# Patient Record
Sex: Female | Born: 1960 | Race: White | Hispanic: No | Marital: Married | Smoking: Never smoker
Health system: Southern US, Community
[De-identification: ages and names within clinical notes are randomized; demographics above are authoritative.]

## PROBLEM LIST (undated history)

## (undated) DIAGNOSIS — I10 Essential (primary) hypertension: Secondary | ICD-10-CM

## (undated) DIAGNOSIS — R945 Abnormal results of liver function studies: Secondary | ICD-10-CM

## (undated) DIAGNOSIS — M109 Gout, unspecified: Secondary | ICD-10-CM

## (undated) DIAGNOSIS — N189 Chronic kidney disease, unspecified: Secondary | ICD-10-CM

## (undated) DIAGNOSIS — R739 Hyperglycemia, unspecified: Secondary | ICD-10-CM

## (undated) DIAGNOSIS — K219 Gastro-esophageal reflux disease without esophagitis: Secondary | ICD-10-CM

## (undated) DIAGNOSIS — E7800 Pure hypercholesterolemia, unspecified: Secondary | ICD-10-CM

## (undated) DIAGNOSIS — D649 Anemia, unspecified: Secondary | ICD-10-CM

## (undated) HISTORY — DX: Gastro-esophageal reflux disease without esophagitis: K21.9

## (undated) HISTORY — DX: Anemia, unspecified: D64.9

## (undated) HISTORY — DX: Gout, unspecified: M10.9

## (undated) HISTORY — DX: Pure hypercholesterolemia, unspecified: E78.00

## (undated) HISTORY — DX: Chronic kidney disease, unspecified: N18.9

## (undated) HISTORY — DX: Hyperglycemia, unspecified: R73.9

## (undated) HISTORY — DX: Essential (primary) hypertension: I10

## (undated) HISTORY — DX: Abnormal results of liver function studies: R94.5

---
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
## 1985-04-11 HISTORY — PX: TUBAL LIGATION: SHX77

## 2004-07-30 ENCOUNTER — Ambulatory Visit: Payer: Self-pay | Admitting: Internal Medicine

## 2004-08-12 ENCOUNTER — Ambulatory Visit: Payer: Self-pay | Admitting: Internal Medicine

## 2005-06-03 ENCOUNTER — Ambulatory Visit: Payer: Self-pay | Admitting: Internal Medicine

## 2005-09-02 ENCOUNTER — Ambulatory Visit: Payer: Self-pay | Admitting: Internal Medicine

## 2005-09-13 ENCOUNTER — Ambulatory Visit: Payer: Self-pay | Admitting: Internal Medicine

## 2006-09-08 ENCOUNTER — Ambulatory Visit: Payer: Self-pay | Admitting: Internal Medicine

## 2007-08-10 ENCOUNTER — Ambulatory Visit: Payer: Self-pay | Admitting: Internal Medicine

## 2007-08-23 ENCOUNTER — Ambulatory Visit: Payer: Self-pay | Admitting: Internal Medicine

## 2007-09-10 ENCOUNTER — Ambulatory Visit: Payer: Self-pay | Admitting: Internal Medicine

## 2007-09-11 ENCOUNTER — Ambulatory Visit: Payer: Self-pay | Admitting: Internal Medicine

## 2007-11-01 ENCOUNTER — Ambulatory Visit: Payer: Self-pay | Admitting: Internal Medicine

## 2007-11-10 ENCOUNTER — Ambulatory Visit: Payer: Self-pay | Admitting: Internal Medicine

## 2007-12-11 ENCOUNTER — Ambulatory Visit: Payer: Self-pay | Admitting: Internal Medicine

## 2007-12-27 ENCOUNTER — Ambulatory Visit: Payer: Self-pay | Admitting: Internal Medicine

## 2008-01-10 ENCOUNTER — Ambulatory Visit: Payer: Self-pay | Admitting: Internal Medicine

## 2008-04-01 ENCOUNTER — Ambulatory Visit: Payer: Self-pay | Admitting: Internal Medicine

## 2008-09-11 ENCOUNTER — Ambulatory Visit: Payer: Self-pay | Admitting: Internal Medicine

## 2008-10-14 ENCOUNTER — Ambulatory Visit: Payer: Self-pay | Admitting: Internal Medicine

## 2008-11-09 ENCOUNTER — Ambulatory Visit: Payer: Self-pay | Admitting: Internal Medicine

## 2008-11-20 ENCOUNTER — Ambulatory Visit: Payer: Self-pay | Admitting: Internal Medicine

## 2008-12-10 ENCOUNTER — Ambulatory Visit: Payer: Self-pay | Admitting: Internal Medicine

## 2009-02-06 ENCOUNTER — Ambulatory Visit: Payer: Self-pay | Admitting: Gastroenterology

## 2009-02-12 ENCOUNTER — Ambulatory Visit: Payer: Self-pay | Admitting: Internal Medicine

## 2009-03-11 ENCOUNTER — Ambulatory Visit: Payer: Self-pay | Admitting: Internal Medicine

## 2009-05-12 ENCOUNTER — Ambulatory Visit: Payer: Self-pay | Admitting: Internal Medicine

## 2009-05-14 ENCOUNTER — Ambulatory Visit: Payer: Self-pay | Admitting: Internal Medicine

## 2009-06-09 ENCOUNTER — Ambulatory Visit: Payer: Self-pay | Admitting: Internal Medicine

## 2009-09-24 ENCOUNTER — Ambulatory Visit: Payer: Self-pay | Admitting: Gastroenterology

## 2010-01-20 ENCOUNTER — Ambulatory Visit: Payer: Self-pay | Admitting: Internal Medicine

## 2010-02-09 ENCOUNTER — Ambulatory Visit: Payer: Self-pay | Admitting: Internal Medicine

## 2010-03-08 ENCOUNTER — Ambulatory Visit: Payer: Self-pay | Admitting: Internal Medicine

## 2011-01-31 ENCOUNTER — Ambulatory Visit: Payer: Self-pay | Admitting: Gastroenterology

## 2011-03-23 ENCOUNTER — Ambulatory Visit: Payer: Self-pay | Admitting: Internal Medicine

## 2011-09-14 ENCOUNTER — Ambulatory Visit: Payer: Self-pay | Admitting: Gastroenterology

## 2012-03-01 ENCOUNTER — Telehealth: Payer: Self-pay | Admitting: Internal Medicine

## 2012-03-01 NOTE — Telephone Encounter (Signed)
[facility] Refill lovaza 1000mg  take 2 tablets twice daily crestor 5mg  once daily

## 2012-03-05 MED ORDER — OMEGA-3-ACID ETHYL ESTERS 1 G PO CAPS: ORAL_CAPSULE | ORAL | Status: DC

## 2012-03-05 MED ORDER — ROSUVASTATIN CALCIUM 5 MG PO TABS: 5.0000 mg | ORAL_TABLET | Freq: Every day | ORAL | Status: DC

## 2012-03-05 NOTE — Telephone Encounter (Signed)
Sent in to pharmacy. Patient notified.  

## 2012-03-16 DIAGNOSIS — K739 Chronic hepatitis, unspecified: Secondary | ICD-10-CM | POA: Insufficient documentation

## 2012-03-20 ENCOUNTER — Encounter: Payer: Self-pay | Admitting: Internal Medicine

## 2012-03-20 ENCOUNTER — Other Ambulatory Visit (HOSPITAL_COMMUNITY)
Admission: RE | Admit: 2012-03-20 | Discharge: 2012-03-20 | Disposition: A | Discharge status: Home / Self Care | Payer: BC Managed Care – PPO | Source: Ambulatory Visit | Attending: Internal Medicine | Admitting: Internal Medicine

## 2012-03-20 ENCOUNTER — Ambulatory Visit (INDEPENDENT_AMBULATORY_CARE_PROVIDER_SITE_OTHER): Payer: BC Managed Care – PPO | Admitting: Internal Medicine

## 2012-03-20 VITALS — BP 128/78 | HR 56 | Temp 98.5°F | Ht 64.0 in | Wt 176.0 lb

## 2012-03-20 DIAGNOSIS — N189 Chronic kidney disease, unspecified: Secondary | ICD-10-CM

## 2012-03-20 DIAGNOSIS — E781 Pure hyperglyceridemia: Secondary | ICD-10-CM

## 2012-03-20 DIAGNOSIS — R7309 Other abnormal glucose: Secondary | ICD-10-CM

## 2012-03-20 DIAGNOSIS — M109 Gout, unspecified: Secondary | ICD-10-CM

## 2012-03-20 DIAGNOSIS — R7989 Other specified abnormal findings of blood chemistry: Secondary | ICD-10-CM

## 2012-03-20 DIAGNOSIS — Z139 Encounter for screening, unspecified: Secondary | ICD-10-CM

## 2012-03-20 DIAGNOSIS — R739 Hyperglycemia, unspecified: Secondary | ICD-10-CM

## 2012-03-20 DIAGNOSIS — Z01419 Encounter for gynecological examination (general) (routine) without abnormal findings: Secondary | ICD-10-CM | POA: Insufficient documentation

## 2012-03-20 DIAGNOSIS — I10 Essential (primary) hypertension: Secondary | ICD-10-CM | POA: Insufficient documentation

## 2012-03-20 DIAGNOSIS — N184 Chronic kidney disease, stage 4 (severe): Secondary | ICD-10-CM | POA: Insufficient documentation

## 2012-03-20 DIAGNOSIS — Z1151 Encounter for screening for human papillomavirus (HPV): Secondary | ICD-10-CM | POA: Insufficient documentation

## 2012-03-20 DIAGNOSIS — R945 Abnormal results of liver function studies: Secondary | ICD-10-CM | POA: Insufficient documentation

## 2012-03-20 NOTE — Assessment & Plan Note (Addendum)
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  Seeing nephrology.  Just saw Dr Alfonse Alpers.  Stable renal function.  Obtain recent labs.

## 2012-03-20 NOTE — Progress Notes (Signed)
Subjective:    Patient ID: Angel Collins, female    DOB: 1960-04-14, 51 y.o.   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  She comes in today to follow up on these issues as well as for a complete physical exam.  States she is doing well.  Saw Dr Alfonse Alpers recently.  States kidney function stable.  Had follow up with [facility].  Felt liver - stable. Got her flu shot last week.  Completed her hepatitis series.  Blood pressure has been doing well.  Overall she feels good.   Past Medical History  Diagnosis Date  . Hypertension   . Hypercholesterolemia   . Chronic kidney disease     medullary cystic kidney disease  . Abnormal liver function     fatty liver  . Gout   . Hyperglycemia      Current Outpatient Prescriptions on File Prior to Visit  Medication Sig Dispense Refill  . enalapril (VASOTEC) 5 MG tablet Take 5 mg by mouth daily.      . febuxostat (ULORIC) 40 MG tablet Take 40 mg by mouth daily.      . ferrous sulfate 325 (65 FE) MG tablet Take 325 mg by mouth daily.      . metoprolol (LOPRESSOR) 50 MG tablet Take 50 mg by mouth 2 (two) times daily.      Marland Kitchen omega-3 acid ethyl esters (LOVAZA) 1 G capsule Take Two Tablets Twice A Day  120 capsule  1  . omeprazole (PRILOSEC) 20 MG capsule Take 20 mg by mouth daily.      . rosuvastatin (CRESTOR) 5 MG tablet Take 1 tablet (5 mg total) by mouth daily.  30 tablet  1    Review of Systems Patient denies any headache, lightheadedness or dizziness.  No significant sinus or allergy symptoms.  No chest pain, tightness or palpitations.  No increased shortness of breath or congestion. Minimal cough occasionally.  No nausea or vomiting.  No abdominal pain or cramping.  No bowel change, such as diarrhea, constipation, BRBPR or melana.  No urine change.        Objective:   Physical Exam Filed  Vitals:   03/20/12 1113  BP: 128/78  Pulse: 56  Temp: 98.5 F (24.74 C)   51 year old female in no acute distress.   HEENT:  Nares- clear.  Oropharynx - without lesions. NECK:  Supple.  Nontender.  No audible bruit.  HEART:  Appears to be regular. LUNGS:  No crackles or wheezing audible.  Respirations even and unlabored.  RADIAL PULSE:  Equal bilaterally.    BREASTS:  No nipple discharge or nipple retraction present.  Could not appreciate any distinct nodules or axillary adenopathy.  ABDOMEN:  Soft, nontender.  Bowel sounds present and normal.  No audible abdominal bruit.  GU:  Normal external genitalia.  Vaginal vault without lesions.  Cervix identified.  Pap performed. Could not appreciate any adnexal masses or tenderness.   RECTAL:  Heme negative.   EXTREMITIES:  No increased edema present.  DP pulses palpable and equal bilaterally.          Assessment & Plan:  GI.  Asymptomatic.  Colonoscopy 10/07/08 revealed diverticulosis.  Recommended 10 year follow up colonoscopy.    CARDIOVASCULAR.  Stress test 08/18/08 negative.  Continue risk factor modification.   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  Has been released.  They recommended q 6 months cbc to confirm stable.    HEALTH MAINTENANCE.  Physical today.  Pap performed.  Colonoscopy 10/07/08 revealed diverticulosis.  Recommended follow up colonoscopy in 10 years.  Mammogram 03/23/11 - Birads II. Schedule follow up mammogram.  Hemoccult testing.

## 2012-03-20 NOTE — Assessment & Plan Note (Addendum)
Felt to have fatty liver.  Saw GI recently.  Felt things were stable.  States they just checked liver function.  Obtain records.  Just completed her hepatitis series.

## 2012-03-20 NOTE — Patient Instructions (Addendum)
It was nice seeing you today.  I am glad you have been doing well.  Let me know if you need anything. 

## 2012-03-20 NOTE — Assessment & Plan Note (Signed)
On Uloric. No flares.

## 2012-03-24 ENCOUNTER — Encounter: Payer: Self-pay | Admitting: Internal Medicine

## 2012-03-24 NOTE — Assessment & Plan Note (Signed)
Low fat diet.  Check lipid profile with next fasting labs.

## 2012-03-24 NOTE — Assessment & Plan Note (Signed)
Continue same meds.  Check met b.  Follow.

## 2012-03-24 NOTE — Assessment & Plan Note (Signed)
Low carb diet.  Exercise.  Check met b and a1c.

## 2012-03-26 ENCOUNTER — Encounter: Payer: Self-pay | Admitting: *Deleted

## 2012-04-05 ENCOUNTER — Ambulatory Visit: Payer: Self-pay | Admitting: Internal Medicine

## 2012-04-30 ENCOUNTER — Encounter: Payer: Self-pay | Admitting: Internal Medicine

## 2012-05-08 ENCOUNTER — Other Ambulatory Visit: Payer: Self-pay | Admitting: General Practice

## 2012-05-08 MED ORDER — ROSUVASTATIN CALCIUM 5 MG PO TABS: 5.0000 mg | ORAL_TABLET | Freq: Every day | ORAL | Status: DC

## 2012-05-08 NOTE — Telephone Encounter (Signed)
Med filled.  

## 2012-05-15 ENCOUNTER — Other Ambulatory Visit: Payer: Self-pay | Admitting: *Deleted

## 2012-05-15 MED ORDER — ENALAPRIL MALEATE 5 MG PO TABS: 5.0000 mg | ORAL_TABLET | Freq: Every day | ORAL | Status: DC

## 2012-05-15 MED ORDER — METOPROLOL TARTRATE 50 MG PO TABS: 50.0000 mg | ORAL_TABLET | Freq: Two times a day (BID) | ORAL | Status: DC

## 2012-05-16 ENCOUNTER — Telehealth: Payer: Self-pay | Admitting: Internal Medicine

## 2012-05-16 NOTE — Telephone Encounter (Signed)
Opened in error

## 2012-05-21 ENCOUNTER — Other Ambulatory Visit: Payer: Self-pay | Admitting: *Deleted

## 2012-05-22 MED ORDER — OMEPRAZOLE 20 MG PO CPDR: 20.0000 mg | DELAYED_RELEASE_CAPSULE | Freq: Every day | ORAL | Status: DC

## 2012-05-22 MED ORDER — FEBUXOSTAT 40 MG PO TABS: 40.0000 mg | ORAL_TABLET | Freq: Every day | ORAL | Status: DC

## 2012-05-22 NOTE — Telephone Encounter (Signed)
Sent in to pharmacy.  

## 2012-06-09 ENCOUNTER — Telehealth: Payer: Self-pay | Admitting: Internal Medicine

## 2012-06-09 MED ORDER — OMEGA-3-ACID ETHYL ESTERS 1 G PO CAPS: ORAL_CAPSULE | ORAL | Status: DC

## 2012-06-09 NOTE — Telephone Encounter (Signed)
Refilled lovaza - #120 with 5 refills

## 2012-06-25 ENCOUNTER — Telehealth: Payer: Self-pay | Admitting: *Deleted

## 2012-06-25 NOTE — Telephone Encounter (Signed)
Called [phone] for Prior authorization on the Lovaza 1 GM Capsule, form is being faxed over now

## 2012-07-12 ENCOUNTER — Other Ambulatory Visit: Payer: Self-pay | Admitting: *Deleted

## 2012-07-13 MED ORDER — ROSUVASTATIN CALCIUM 5 MG PO TABS: 5.0000 mg | ORAL_TABLET | Freq: Every day | ORAL | Status: DC

## 2012-07-17 ENCOUNTER — Telehealth: Payer: Self-pay | Admitting: *Deleted

## 2012-07-17 NOTE — Telephone Encounter (Signed)
Error

## 2012-07-17 NOTE — Telephone Encounter (Signed)
Called [phone] for Prior Authorization on the Lovaza it was APPROVED, Patient will received a letter of approval

## 2012-07-17 NOTE — Telephone Encounter (Signed)
Noted  

## 2012-07-20 ENCOUNTER — Ambulatory Visit (INDEPENDENT_AMBULATORY_CARE_PROVIDER_SITE_OTHER): Payer: BC Managed Care – PPO | Admitting: Internal Medicine

## 2012-07-20 ENCOUNTER — Encounter: Payer: Self-pay | Admitting: Internal Medicine

## 2012-07-20 VITALS — BP 128/68 | HR 60 | Temp 98.3°F | Resp 18 | Wt 175.5 lb

## 2012-07-20 DIAGNOSIS — E781 Pure hyperglyceridemia: Secondary | ICD-10-CM

## 2012-07-20 DIAGNOSIS — R7989 Other specified abnormal findings of blood chemistry: Secondary | ICD-10-CM

## 2012-07-20 DIAGNOSIS — I10 Essential (primary) hypertension: Secondary | ICD-10-CM

## 2012-07-20 DIAGNOSIS — M109 Gout, unspecified: Secondary | ICD-10-CM

## 2012-07-20 DIAGNOSIS — N189 Chronic kidney disease, unspecified: Secondary | ICD-10-CM

## 2012-07-20 DIAGNOSIS — R945 Abnormal results of liver function studies: Secondary | ICD-10-CM

## 2012-07-20 DIAGNOSIS — R739 Hyperglycemia, unspecified: Secondary | ICD-10-CM

## 2012-07-20 DIAGNOSIS — R7309 Other abnormal glucose: Secondary | ICD-10-CM

## 2012-07-22 ENCOUNTER — Encounter: Payer: Self-pay | Admitting: Internal Medicine

## 2012-07-22 MED ORDER — ROSUVASTATIN CALCIUM 5 MG PO TABS: 5.0000 mg | ORAL_TABLET | Freq: Every day | ORAL | Status: DC

## 2012-07-22 NOTE — Assessment & Plan Note (Signed)
Felt to have fatty liver.  Saw GI recently.  Felt things were stable.  Has completed her hepatitis series.  Due follow up with GI within the month.

## 2012-07-22 NOTE — Assessment & Plan Note (Signed)
On Uloric. No flares.

## 2012-07-22 NOTE — Assessment & Plan Note (Signed)
Low fat diet.  Check lipid profile with next fasting labs.

## 2012-07-22 NOTE — Assessment & Plan Note (Signed)
Low carb diet.  Exercise.  Follow met b and a1c.

## 2012-07-22 NOTE — Progress Notes (Signed)
Subjective:    Patient ID: Angel Collins, female    DOB: 1960/11/18, 52 y.o.   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  She comes in today for a scheduled follow up.   States she is doing well.  Saw Dr Alfonse Alpers recently.  States kidney function had declined some.  She has had extensive w/up in preparation for a kidney transplant.  Spoke with the coordinator yesterday and now just waiting on insurance.   Had follow up with [facility].  Felt liver - stable.  Completed her hepatitis series.  Blood pressure has been doing well.  Overall she feels good.  The only complaint she has is some discomfort after eating.  Appears to be more localized to the epigastric region.  Describes it as a dull ache.  Eating does trigger.  No nausea or vomiting.  No bowel change associated.  Still has a good appetite.  Has a follow up with [facility] later this month.     Past Medical History  Diagnosis Date  . Hypertension   . Hypercholesterolemia   . Chronic kidney disease     medullary cystic kidney disease  . Abnormal liver function     fatty liver  . Gout   . Hyperglycemia      Current Outpatient Prescriptions on File Prior to Visit  Medication Sig Dispense Refill  . aspirin 81 MG tablet Take 81 mg by mouth daily.      . enalapril (VASOTEC) 5 MG tablet Take 1 tablet (5 mg total) by mouth daily.  30 tablet  5  . febuxostat (ULORIC) 40 MG tablet Take 1 tablet (40 mg total) by mouth daily.  30 tablet  5  . ferrous sulfate 325 (65 FE) MG tablet Take 325 mg by mouth daily.      . metoprolol (LOPRESSOR) 50 MG tablet Take 1 tablet (50 mg total) by mouth 2 (two) times daily.  60 tablet  5  . omega-3 acid ethyl esters (LOVAZA) 1 G capsule Take one tablet qid  120 capsule  5  . omeprazole (PRILOSEC) 20 MG capsule Take 1 capsule (20 mg total) by mouth  daily.  30 capsule  5  . rosuvastatin (CRESTOR) 5 MG tablet Take 1 tablet (5 mg total) by mouth daily.  30 tablet  1  . vitamin B-12 (CYANOCOBALAMIN) 1000 MCG tablet Take 1,000 mcg by mouth daily.      . vitamin E 400 UNIT capsule Take 400 Units by mouth 2 (two) times daily.       No current facility-administered medications on file prior to visit.    Review of Systems Patient denies any headache, lightheadedness or dizziness.  No significant sinus or allergy symptoms.  No chest pain, tightness or palpitations.  No increased shortness of breath or congestion. No nausea or vomiting.  Does report the abdominal discomfort as outlined.   No bowel change, such as diarrhea, constipation, BRBPR or melana.  No urine change.        Objective:   Physical Exam  Filed Vitals:   07/20/12 0808  BP: 128/68  Pulse: 60  Temp: 98.3 F (36.8 C)  Resp: 75   52 year old female in no acute distress.   HEENT:  Nares- clear.  Oropharynx - without lesions. NECK:  Supple.  Nontender.  No audible bruit.  HEART:  Appears  to be regular. LUNGS:  No crackles or wheezing audible.  Respirations even and unlabored.  RADIAL PULSE:  Equal bilaterally.  ABDOMEN:  Soft.  No significant tenderness.  Minimal tenderness to palpation over the epigastric region.  No rebound or guarding.  Bowel sounds present and normal.  No audible abdominal bruit.   EXTREMITIES:  No increased edema present.  DP pulses palpable and equal bilaterally.          Assessment & Plan:  GI.  Colonoscopy 10/07/08 revealed diverticulosis.  Recommended 10 year follow up colonoscopy.  Abdominal discomfort and exam as outlined.  Discussed further w/up including repeat ultrasound vs CAT scan.  She would like to hold on scanning at this time.  Will continue omeprazole in the morning and add Ranitidine in the evening.  Call with update.  Has f/u planned with GI within the month.  Further w/up pending response.    CARDIOVASCULAR.  Stress test 08/18/08  negative.  Continue risk factor modification.  Apparently recently had repeat ECHO at [facility].  Obtain results.   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  Has been released.  They recommended q 6 months cbc to confirm stable.    HEALTH MAINTENANCE.  Physical 03/20/12.  Pap performed.  Colonoscopy 10/07/08 revealed diverticulosis.  Recommended follow up colonoscopy in 10 years.  Mammogram 04/05/12 - Birads II.

## 2012-07-22 NOTE — Assessment & Plan Note (Signed)
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  Seeing nephrology.  Just saw Dr Alfonse Alpers.  Renal function has declined.  Undergoing w/up for transplant.  Waiting on insurance.

## 2012-07-22 NOTE — Assessment & Plan Note (Signed)
Continue same meds.  Follow met b.

## 2012-08-21 ENCOUNTER — Ambulatory Visit: Payer: Self-pay | Admitting: Gastroenterology

## 2012-11-08 ENCOUNTER — Other Ambulatory Visit: Payer: Self-pay | Admitting: *Deleted

## 2012-11-08 MED ORDER — ENALAPRIL MALEATE 5 MG PO TABS: 5.0000 mg | ORAL_TABLET | Freq: Every day | ORAL | Status: DC

## 2012-11-08 MED ORDER — METOPROLOL TARTRATE 50 MG PO TABS: 50.0000 mg | ORAL_TABLET | Freq: Two times a day (BID) | ORAL | Status: DC

## 2012-11-19 ENCOUNTER — Other Ambulatory Visit: Payer: Self-pay | Admitting: Internal Medicine

## 2012-11-20 ENCOUNTER — Ambulatory Visit (INDEPENDENT_AMBULATORY_CARE_PROVIDER_SITE_OTHER): Payer: BC Managed Care – PPO | Admitting: Internal Medicine

## 2012-11-20 ENCOUNTER — Encounter: Payer: Self-pay | Admitting: Internal Medicine

## 2012-11-20 VITALS — BP 120/62 | HR 58 | Temp 98.5°F | Ht 64.0 in | Wt 174.5 lb

## 2012-11-20 DIAGNOSIS — M109 Gout, unspecified: Secondary | ICD-10-CM

## 2012-11-20 DIAGNOSIS — R739 Hyperglycemia, unspecified: Secondary | ICD-10-CM

## 2012-11-20 DIAGNOSIS — R945 Abnormal results of liver function studies: Secondary | ICD-10-CM

## 2012-11-20 DIAGNOSIS — I10 Essential (primary) hypertension: Secondary | ICD-10-CM

## 2012-11-20 DIAGNOSIS — R7989 Other specified abnormal findings of blood chemistry: Secondary | ICD-10-CM

## 2012-11-20 DIAGNOSIS — D72829 Elevated white blood cell count, unspecified: Secondary | ICD-10-CM

## 2012-11-20 DIAGNOSIS — E781 Pure hyperglyceridemia: Secondary | ICD-10-CM

## 2012-11-20 DIAGNOSIS — R7309 Other abnormal glucose: Secondary | ICD-10-CM

## 2012-11-20 MED ORDER — ROSUVASTATIN CALCIUM 5 MG PO TABS: 5.0000 mg | ORAL_TABLET | Freq: Every day | ORAL | Status: DC

## 2012-11-20 MED ORDER — OMEGA-3-ACID ETHYL ESTERS 1 G PO CAPS: ORAL_CAPSULE | ORAL | Status: DC

## 2012-11-20 NOTE — Assessment & Plan Note (Signed)
On Uloric. No flares.

## 2012-11-20 NOTE — Progress Notes (Signed)
Subjective:    Patient ID: Angel Collins, female    DOB: 05-10-1960, 52 y.o.   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  She comes in today for a scheduled follow up.   States she is doing well.  The previous GI issues have completely resolved.  Saw GYN.  Had a pelvic ultrasound and found to have an adnexal cyst.  States after evaluation by gyn, no further w/up felt warranted.  States her w/up is complete and she is now on the transplant list for a kidney transplant.  Overall she feels she is doing well.  Blood pressure doing well.  Has not been watching what she eats.  Plans to get more serious about this.     Past Medical History  Diagnosis Date  . Hypertension   . Hypercholesterolemia   . Chronic kidney disease     medullary cystic kidney disease  . Abnormal liver function     fatty liver  . Gout   . Hyperglycemia     Current Outpatient Prescriptions on File Prior to Visit  Medication Sig Dispense Refill  . aspirin 81 MG tablet Take 81 mg by mouth daily.      . enalapril (VASOTEC) 5 MG tablet Take 1 tablet (5 mg total) by mouth daily.  30 tablet  5  . ferrous sulfate 325 (65 FE) MG tablet Take 325 mg by mouth daily.      . metoprolol (LOPRESSOR) 50 MG tablet Take 1 tablet (50 mg total) by mouth 2 (two) times daily.  60 tablet  5  . omeprazole (PRILOSEC) 20 MG capsule Take 1 capsule (20 mg total) by mouth daily.  30 capsule  5  . ULORIC 40 MG tablet TAKE 1 TABLET BY MOUTH EVERY DAY AS DIRECTED  30 tablet  5  . vitamin B-12 (CYANOCOBALAMIN) 1000 MCG tablet Take 1,000 mcg by mouth daily.      . vitamin E 400 UNIT capsule Take 400 Units by mouth 2 (two) times daily.       No current facility-administered medications on file prior to visit.    Review of Systems Patient denies any headache, lightheadedness or dizziness.  No  significant sinus or allergy symptoms.  No chest pain, tightness or palpitations.  No increased shortness of breath or congestion. No nausea or vomiting.  The abdominal discomfort has resolved.   No bowel change, such as diarrhea, constipation, BRBPR or melana.  No urine change.        Objective:   Physical Exam  Filed Vitals:   11/20/12 0842  BP: 120/62  Pulse: 58  Temp: 98.5 F (39.66 C)   52 year old female in no acute distress.   HEENT:  Nares- clear.  Oropharynx - without lesions. NECK:  Supple.  Nontender.  No audible bruit.  HEART:  Appears to be regular. LUNGS:  No crackles or wheezing audible.  Respirations even and unlabored.  RADIAL PULSE:  Equal bilaterally.  ABDOMEN:  Soft.  No significant tenderness.  Non tender to palpation.  No rebound or guarding.  Bowel sounds present and normal.  No audible abdominal bruit.   EXTREMITIES:  No increased edema present.  DP pulses palpable and equal bilaterally.          Assessment & Plan:  GI.  Colonoscopy 10/07/08 revealed diverticulosis.  Recommended 10 year follow up  colonoscopy.  The previous abdominal discomfort has resolved.  Doing well.    CARDIOVASCULAR.  Stress test 08/18/08 negative.  Continue risk factor modification.  Apparently recently had repeat ECHO at [facility].  States everything checked out fine.  Currently asymptomatic.  Follow.   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  Has been released.  They recommended q 6 months cbc to confirm stable.    HEALTH MAINTENANCE.  Physical 03/20/12.  Pap performed.  Colonoscopy 10/07/08 revealed diverticulosis.  Recommended follow up colonoscopy in 10 years.  Mammogram 04/05/12 - Birads II.

## 2012-11-20 NOTE — Assessment & Plan Note (Signed)
Low carb diet.  Exercise.  Follow met b and a1c.  Discussed the need for an eye exam.

## 2012-11-20 NOTE — Assessment & Plan Note (Signed)
Low fat diet.  Check lipid profile with next fasting labs.

## 2012-11-20 NOTE — Assessment & Plan Note (Signed)
Felt to have fatty liver.  Saw GI recently.  Felt things were stable.  Has completed her hepatitis series.  Follow liver panel.

## 2012-11-20 NOTE — Assessment & Plan Note (Signed)
Continue same meds.  Follow met b.

## 2012-11-20 NOTE — Assessment & Plan Note (Signed)
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  Seeing nephrology.  Followed by Dr Alfonse Alpers.  Renal function has declined.  Currently on the transplant list.

## 2012-11-29 ENCOUNTER — Other Ambulatory Visit (INDEPENDENT_AMBULATORY_CARE_PROVIDER_SITE_OTHER): Payer: BC Managed Care – PPO

## 2012-11-29 DIAGNOSIS — I10 Essential (primary) hypertension: Secondary | ICD-10-CM

## 2012-11-29 DIAGNOSIS — R739 Hyperglycemia, unspecified: Secondary | ICD-10-CM

## 2012-11-29 DIAGNOSIS — R945 Abnormal results of liver function studies: Secondary | ICD-10-CM

## 2012-11-29 DIAGNOSIS — R7309 Other abnormal glucose: Secondary | ICD-10-CM

## 2012-11-29 DIAGNOSIS — E781 Pure hyperglyceridemia: Secondary | ICD-10-CM

## 2012-11-29 DIAGNOSIS — R7989 Other specified abnormal findings of blood chemistry: Secondary | ICD-10-CM

## 2012-11-29 DIAGNOSIS — D72829 Elevated white blood cell count, unspecified: Secondary | ICD-10-CM

## 2012-11-29 LAB — BASIC METABOLIC PANEL
BUN: 56 mg/dL — ABNORMAL HIGH (ref 6–23)
Calcium: 9.5 mg/dL (ref 8.4–10.5)
Creatinine, Ser: 2.5 mg/dL — ABNORMAL HIGH (ref 0.4–1.2)
GFR: 21.64 mL/min — ABNORMAL LOW (ref >60.00)
Glucose, Bld: 110 mg/dL — ABNORMAL HIGH (ref 70–99)

## 2012-11-29 LAB — CBC WITH DIFFERENTIAL/PLATELET
Basophils Relative: 0.5 % (ref 0.0–3.0)
Eosinophils Absolute: 0.2 10*3/uL (ref 0.0–0.7)
Hemoglobin: 11.2 g/dL — ABNORMAL LOW (ref 12.0–15.0)
Lymphocytes Relative: 35.7 % (ref 12.0–46.0)
MCHC: 33.8 g/dL (ref 30.0–36.0)
MCV: 93.1 fl (ref 78.0–100.0)
Neutro Abs: 4.6 10*3/uL (ref 1.4–7.7)
RBC: 3.55 Mil/uL — ABNORMAL LOW (ref 3.87–5.11)

## 2012-11-29 LAB — HEPATIC FUNCTION PANEL
ALT: 43 U/L — ABNORMAL HIGH (ref 0–35)
AST: 25 U/L (ref 0–37)
Albumin: 3.5 g/dL (ref 3.5–5.2)
Alkaline Phosphatase: 82 U/L (ref 39–117)
Bilirubin, Direct: 0.1 mg/dL (ref 0.0–0.3)
Total Protein: 7.4 g/dL (ref 6.0–8.3)

## 2012-11-29 LAB — LIPID PANEL: HDL: 40.9 mg/dL (ref >39.00)

## 2012-11-29 LAB — LDL CHOLESTEROL, DIRECT: Direct LDL: 70.5 mg/dL

## 2012-12-02 ENCOUNTER — Encounter: Payer: Self-pay | Admitting: Internal Medicine

## 2012-12-02 ENCOUNTER — Telehealth: Payer: Self-pay | Admitting: Internal Medicine

## 2012-12-02 DIAGNOSIS — D649 Anemia, unspecified: Secondary | ICD-10-CM

## 2012-12-02 NOTE — Telephone Encounter (Signed)
Pt notified of lab results via my chart.  She needs a follow up non fasting lab appt within 1-2 weeks.  Please schedule and notify pt of appt date and time.  Thanks.

## 2012-12-03 NOTE — Telephone Encounter (Signed)
Appointment 9/9  Pt aware

## 2012-12-18 ENCOUNTER — Encounter: Payer: Self-pay | Admitting: Internal Medicine

## 2012-12-18 ENCOUNTER — Other Ambulatory Visit (INDEPENDENT_AMBULATORY_CARE_PROVIDER_SITE_OTHER): Payer: BC Managed Care – PPO

## 2012-12-18 DIAGNOSIS — D649 Anemia, unspecified: Secondary | ICD-10-CM

## 2012-12-18 LAB — CBC WITH DIFFERENTIAL/PLATELET
Basophils Absolute: 0.1 10*3/uL (ref 0.0–0.1)
Eosinophils Absolute: 0.2 10*3/uL (ref 0.0–0.7)
Hemoglobin: 11.5 g/dL — ABNORMAL LOW (ref 12.0–15.0)
Lymphocytes Relative: 40.2 % (ref 12.0–46.0)
MCHC: 33.5 g/dL (ref 30.0–36.0)
Monocytes Relative: 7.4 % (ref 3.0–12.0)
Neutro Abs: 3.9 10*3/uL (ref 1.4–7.7)
Neutrophils Relative %: 48.7 % (ref 43.0–77.0)
Platelets: 206 10*3/uL (ref 150.0–400.0)
RDW: 13.3 % (ref 11.5–14.6)

## 2012-12-18 LAB — FERRITIN: Ferritin: 276 ng/mL (ref 10.0–291.0)

## 2012-12-18 LAB — IBC PANEL: Transferrin: 264.4 mg/dL (ref 212.0–360.0)

## 2012-12-21 ENCOUNTER — Encounter: Payer: Self-pay | Admitting: Internal Medicine

## 2012-12-21 ENCOUNTER — Other Ambulatory Visit: Payer: Self-pay | Admitting: *Deleted

## 2013-01-02 ENCOUNTER — Encounter: Payer: Self-pay | Admitting: Internal Medicine

## 2013-01-03 ENCOUNTER — Ambulatory Visit (INDEPENDENT_AMBULATORY_CARE_PROVIDER_SITE_OTHER): Payer: BC Managed Care – PPO | Admitting: Internal Medicine

## 2013-01-03 ENCOUNTER — Encounter: Payer: Self-pay | Admitting: Internal Medicine

## 2013-01-03 VITALS — BP 98/64 | HR 55 | Temp 98.2°F | Ht 64.0 in | Wt 171.8 lb

## 2013-01-03 DIAGNOSIS — R7309 Other abnormal glucose: Secondary | ICD-10-CM

## 2013-01-03 DIAGNOSIS — I10 Essential (primary) hypertension: Secondary | ICD-10-CM

## 2013-01-03 DIAGNOSIS — R7989 Other specified abnormal findings of blood chemistry: Secondary | ICD-10-CM

## 2013-01-03 DIAGNOSIS — R945 Abnormal results of liver function studies: Secondary | ICD-10-CM

## 2013-01-03 DIAGNOSIS — N189 Chronic kidney disease, unspecified: Secondary | ICD-10-CM

## 2013-01-03 DIAGNOSIS — Z23 Encounter for immunization: Secondary | ICD-10-CM

## 2013-01-03 DIAGNOSIS — E781 Pure hyperglyceridemia: Secondary | ICD-10-CM

## 2013-01-03 DIAGNOSIS — M109 Gout, unspecified: Secondary | ICD-10-CM

## 2013-01-03 DIAGNOSIS — R739 Hyperglycemia, unspecified: Secondary | ICD-10-CM

## 2013-01-06 ENCOUNTER — Telehealth: Payer: Self-pay | Admitting: Internal Medicine

## 2013-01-06 ENCOUNTER — Encounter: Payer: Self-pay | Admitting: Internal Medicine

## 2013-01-06 NOTE — Progress Notes (Signed)
Subjective:    Patient ID: Angel Collins, female    DOB: 08-Aug-1960, 52 y.o.   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  She comes in today as a work in to discuss her sugars and referral to [facility] and weight loss.  States she is doing well.  The previous GI issues have completely resolved.  Saw GYN.  Had a pelvic ultrasound and found to have an adnexal cyst.  States after evaluation by gyn, no further w/up felt warranted.  States her w/up is complete and she is on the transplant list for a kidney transplant.   Collins pressure doing well.  Has not been watching what she eats.  Plans to get more serious about this.  Saw Dr Alfonse Alpers recently.  She discussed with her regarding her sugars.  We discussed this at length today.  Discussed diet adjustment and exercise.  Discussed checking sugars.  Discussed my desire to refer to a nutritionist first instead of starting medication for weight loss    Past Medical History  Diagnosis Date  . Hypertension   . Hypercholesterolemia   . Chronic kidney disease     medullary cystic kidney disease  . Abnormal liver function     fatty liver  . Gout   . Hyperglycemia     Current Outpatient Prescriptions on File Prior to Visit  Medication Sig Dispense Refill  . aspirin 81 MG tablet Take 81 mg by mouth daily.      . enalapril (VASOTEC) 5 MG tablet Take 1 tablet (5 mg total) by mouth daily.  30 tablet  5  . ferrous sulfate 325 (65 FE) MG tablet Take 325 mg by mouth daily.      . metoprolol (LOPRESSOR) 50 MG tablet Take 1 tablet (50 mg total) by mouth 2 (two) times daily.  60 tablet  5  . omega-3 acid ethyl esters (LOVAZA) 1 G capsule Take one tablet qid  120 capsule  5  . omeprazole (PRILOSEC) 20 MG capsule Take 1 capsule (20 mg total) by mouth daily.  30 capsule  5  . rosuvastatin (CRESTOR) 5 MG tablet  Take 1 tablet (5 mg total) by mouth daily.  30 tablet  5  . ULORIC 40 MG tablet TAKE 1 TABLET BY MOUTH EVERY DAY AS DIRECTED  30 tablet  5  . vitamin B-12 (CYANOCOBALAMIN) 1000 MCG tablet Take 1,000 mcg by mouth daily.      . vitamin E 400 UNIT capsule Take 400 Units by mouth 2 (two) times daily.       No current facility-administered medications on file prior to visit.    Review of Systems Patient denies any headache, lightheadedness or dizziness.  No significant sinus or allergy symptoms.  No chest pain, tightness or palpitations.  No increased shortness of breath or congestion. No nausea or vomiting.  The abdominal discomfort has resolved.   No bowel change, such as diarrhea, constipation, BRBPR or melana.  No urine change.        Objective:   Physical Exam  Filed Vitals:   01/03/13 1050  BP: 98/64  Pulse: 55  Temp: 98.2 F (5.87 C)   52 year old female in no acute distress.   HEENT:  Nares- clear.  Oropharynx - without lesions. NECK:  Supple.  Nontender.  No audible bruit.  HEART:  Appears to be regular.  LUNGS:  No crackles or wheezing audible.  Respirations even and unlabored.  RADIAL PULSE:  Equal bilaterally.  ABDOMEN:  Soft.  No significant tenderness.  Non tender to palpation.  No rebound or guarding.  Bowel sounds present and normal.  No audible abdominal bruit.   EXTREMITIES:  No increased edema present.  DP pulses palpable and equal bilaterally.  FEET:  Without lesions.           Assessment & Plan:  GI.  Colonoscopy 10/07/08 revealed diverticulosis.  Recommended 10 year follow up colonoscopy.  The previous abdominal discomfort has resolved.  Doing well.    CARDIOVASCULAR.  Stress test 08/18/08 negative.  Continue risk factor modification.  Apparently recently had repeat ECHO at [facility].  States everything checked out fine.  Currently asymptomatic.  Follow.   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  Has been released.  They recommended q 6 months cbc to confirm stable.     HEALTH MAINTENANCE.  Physical 03/20/12.  Pap performed.  Colonoscopy 10/07/08 revealed diverticulosis.  Recommended follow up colonoscopy in 10 years.  Mammogram 04/05/12 - Birads II.

## 2013-01-06 NOTE — Assessment & Plan Note (Signed)
Low fat diet.  Follow fasting lipid profile.

## 2013-01-06 NOTE — Telephone Encounter (Signed)
Pt needs referral to [facility] for nutrition counseling only.  Does have hyperglycemia, hypertension and hyperlipidemia.   Thanks.

## 2013-01-06 NOTE — Assessment & Plan Note (Signed)
On Uloric. No flares.

## 2013-01-06 NOTE — Assessment & Plan Note (Signed)
Sugars elevated.  She was given a glucometer today.  Instructed on proper way to check her sugars.  Refer to lif[facility]yles for nutrition counseling.  Pt in agreement.  Exercise.

## 2013-01-06 NOTE — Assessment & Plan Note (Signed)
Felt to have fatty liver.  Saw GI recently.  Felt things were stable.  Has completed her hepatitis series.  Follow liver panel.

## 2013-01-06 NOTE — Assessment & Plan Note (Signed)
Continue same meds.  Follow met b.

## 2013-01-06 NOTE — Assessment & Plan Note (Signed)
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  Seeing nephrology.  Followed by Dr Alfonse Alpers.  Renal function has declined.  Currently on the transplant list.

## 2013-01-07 NOTE — Telephone Encounter (Signed)
I can't find the referral sheet.  If you will give me another one I will be glad to complete.  Thanks.

## 2013-01-07 NOTE — Telephone Encounter (Signed)
Referral placed in your folder to be signed.

## 2013-02-06 ENCOUNTER — Encounter: Payer: Self-pay | Admitting: Emergency Medicine

## 2013-03-16 ENCOUNTER — Other Ambulatory Visit: Payer: Self-pay | Admitting: Internal Medicine

## 2013-03-26 ENCOUNTER — Ambulatory Visit (INDEPENDENT_AMBULATORY_CARE_PROVIDER_SITE_OTHER): Payer: BC Managed Care – PPO | Admitting: Internal Medicine

## 2013-03-26 ENCOUNTER — Encounter: Payer: Self-pay | Admitting: Internal Medicine

## 2013-03-26 VITALS — BP 100/60 | HR 60 | Temp 98.1°F | Ht 61.0 in | Wt 170.5 lb

## 2013-03-26 DIAGNOSIS — R739 Hyperglycemia, unspecified: Secondary | ICD-10-CM

## 2013-03-26 DIAGNOSIS — M109 Gout, unspecified: Secondary | ICD-10-CM

## 2013-03-26 DIAGNOSIS — R7989 Other specified abnormal findings of blood chemistry: Secondary | ICD-10-CM

## 2013-03-26 DIAGNOSIS — I10 Essential (primary) hypertension: Secondary | ICD-10-CM

## 2013-03-26 DIAGNOSIS — R945 Abnormal results of liver function studies: Secondary | ICD-10-CM

## 2013-03-26 DIAGNOSIS — E781 Pure hyperglyceridemia: Secondary | ICD-10-CM

## 2013-03-26 DIAGNOSIS — D72829 Elevated white blood cell count, unspecified: Secondary | ICD-10-CM

## 2013-03-26 DIAGNOSIS — Z1239 Encounter for other screening for malignant neoplasm of breast: Secondary | ICD-10-CM

## 2013-03-26 DIAGNOSIS — N189 Chronic kidney disease, unspecified: Secondary | ICD-10-CM

## 2013-03-26 DIAGNOSIS — R7309 Other abnormal glucose: Secondary | ICD-10-CM

## 2013-03-26 NOTE — Progress Notes (Signed)
Subjective:    Patient ID: Angel Collins, female    DOB: 1960-09-28, 52 y.o.   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  She comes in today to follow up on these issues as well as for a complete physical exam.  States she is doing well.  The previous GI issues have completely resolved.  Saw GYN.  Had a pelvic ultrasound and found to have an adnexal cyst.  States after evaluation by gyn, no further w/up felt warranted.  Her w/up is complete and she is now on the transplant list for a kidney transplant.  Overall she feels she is doing well.  Blood pressure doing well.  Has not been watching what she eats.  Plans to get more serious about this.  States her blood pressure has been averaging 100-110/65-70.       Past Medical History  Diagnosis Date  . Hypertension   . Hypercholesterolemia   . Chronic kidney disease     medullary cystic kidney disease  . Abnormal liver function     fatty liver  . Gout   . Hyperglycemia     Current Outpatient Prescriptions on File Prior to Visit  Medication Sig Dispense Refill  . aspirin 81 MG tablet Take 81 mg by mouth daily.      . CRESTOR 5 MG tablet TAKE 1 TABLET BY MOUTH EVERY DAY  30 tablet  5  . enalapril (VASOTEC) 5 MG tablet Take 1 tablet (5 mg total) by mouth daily.  30 tablet  5  . ferrous sulfate 325 (65 FE) MG tablet Take 325 mg by mouth daily.      . metoprolol (LOPRESSOR) 50 MG tablet Take 1 tablet (50 mg total) by mouth 2 (two) times daily.  60 tablet  5  . omega-3 acid ethyl esters (LOVAZA) 1 G capsule Take one tablet qid  120 capsule  5  . rosuvastatin (CRESTOR) 5 MG tablet Take 1 tablet (5 mg total) by mouth daily.  30 tablet  5  . ULORIC 40 MG tablet TAKE 1 TABLET BY MOUTH EVERY DAY AS DIRECTED  30 tablet  5  . vitamin B-12 (CYANOCOBALAMIN) 1000 MCG tablet Take 1,000 mcg by mouth  daily.      . vitamin E 400 UNIT capsule Take 400 Units by mouth 2 (two) times daily.       No current facility-administered medications on file prior to visit.    Review of Systems Patient denies any headache, lightheadedness or dizziness.  No significant sinus or allergy symptoms.  No chest pain, tightness or palpitations.  No increased shortness of breath or congestion.  No nausea or vomiting.  The abdominal discomfort has resolved.   No bowel change, such as diarrhea, constipation, BRBPR or melana.  No urine change.  Blood pressure as outlined.  Due to see the dietician on 04/08/13.        Objective:   Physical Exam  Filed Vitals:   03/26/13 1548  BP: 100/60  Pulse: 60  Temp: 98.1 F (36.7 C)   Blood pressure recheck:  104/72, pulse 65  52 year old female in no acute distress.   HEENT:  Nares- clear.  Oropharynx - without lesions. NECK:  Supple.  Nontender.  No audible bruit.  HEART:  Appears to be regular. LUNGS:  No crackles or wheezing audible.  Respirations even  and unlabored.  RADIAL PULSE:  Equal bilaterally.    BREASTS:  No nipple discharge or nipple retraction present.  Could not appreciate any distinct nodules or axillary adenopathy.  ABDOMEN:  Soft, nontender.  Bowel sounds present and normal.  No audible abdominal bruit.  GU:  Not performed.     EXTREMITIES:  No increased edema present.  DP pulses palpable and equal bilaterally.      FEET:  Without lesions.       Assessment & Plan:  GI.  Colonoscopy 10/07/08 revealed diverticulosis.  Recommended 10 year follow up colonoscopy.  The previous abdominal discomfort has resolved.  Doing well.    CARDIOVASCULAR.  Stress test 08/18/08 negative.  Continue risk factor modification.  Apparently recently had repeat ECHO at [facility].  States everything checked out fine.  Currently asymptomatic.  Follow.   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  Has been released.  They recommended q 6 months cbc to confirm stable.    HEALTH  MAINTENANCE.  Physical today.  Pap 03/20/12 negative with negative HPV.  Colonoscopy 10/07/08 revealed diverticulosis.  Recommended follow up colonoscopy in 10 years.  Mammogram 04/05/12 - Birads II.  Schedule a f/u mammogram.

## 2013-03-26 NOTE — Progress Notes (Signed)
Pre-visit discussion using our clinic review tool. No additional management support is needed unless otherwise documented below in the visit note.  

## 2013-03-29 ENCOUNTER — Encounter: Payer: Self-pay | Admitting: Emergency Medicine

## 2013-03-31 ENCOUNTER — Encounter: Payer: Self-pay | Admitting: Internal Medicine

## 2013-03-31 DIAGNOSIS — D72829 Elevated white blood cell count, unspecified: Secondary | ICD-10-CM | POA: Insufficient documentation

## 2013-03-31 NOTE — Assessment & Plan Note (Signed)
Low carb diet and exercise.  Planning to go to [facility] 04/08/13.  Diet, exercise and weight loss.  Follow.

## 2013-03-31 NOTE — Assessment & Plan Note (Signed)
Felt to have fatty liver.  Saw GI recently.  Felt things were stable.  Has completed her hepatitis series.  Follow liver panel.

## 2013-03-31 NOTE — Assessment & Plan Note (Signed)
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  Seeing nephrology.  Followed by Dr Alfonse Alpers.  Renal function has declined.  Currently on the transplant list.

## 2013-03-31 NOTE — Assessment & Plan Note (Signed)
Low fat diet.  Follow fasting lipid profile.

## 2013-03-31 NOTE — Assessment & Plan Note (Addendum)
Blood pressure as outlined.  Low.  Will decrease metoprolol to 50mg  in the am and 1/2(50mg ) in the evening.  Follow pressures.  Get her back in soon to reassess.  Continue to adjust if needed.  Follow metabolic panel.

## 2013-03-31 NOTE — Assessment & Plan Note (Signed)
Saw hematology.  Recommended f/u cbc every 6 months.

## 2013-03-31 NOTE — Assessment & Plan Note (Signed)
On Uloric. No flares.

## 2013-04-08 ENCOUNTER — Ambulatory Visit: Payer: Self-pay | Admitting: Internal Medicine

## 2013-04-08 ENCOUNTER — Other Ambulatory Visit (INDEPENDENT_AMBULATORY_CARE_PROVIDER_SITE_OTHER): Payer: BC Managed Care – PPO

## 2013-04-08 DIAGNOSIS — R7989 Other specified abnormal findings of blood chemistry: Secondary | ICD-10-CM

## 2013-04-08 DIAGNOSIS — R7309 Other abnormal glucose: Secondary | ICD-10-CM

## 2013-04-08 DIAGNOSIS — I10 Essential (primary) hypertension: Secondary | ICD-10-CM

## 2013-04-08 DIAGNOSIS — E781 Pure hyperglyceridemia: Secondary | ICD-10-CM

## 2013-04-08 DIAGNOSIS — R945 Abnormal results of liver function studies: Secondary | ICD-10-CM

## 2013-04-08 DIAGNOSIS — D72829 Elevated white blood cell count, unspecified: Secondary | ICD-10-CM

## 2013-04-08 DIAGNOSIS — R739 Hyperglycemia, unspecified: Secondary | ICD-10-CM

## 2013-04-08 LAB — LIPID PANEL
Cholesterol: 208 mg/dL — ABNORMAL HIGH (ref 0–200)
HDL: 47.6 mg/dL (ref >39.00)
Total CHOL/HDL Ratio: 4
Triglycerides: 486 mg/dL — ABNORMAL HIGH (ref 0.0–149.0)
VLDL: 97.2 mg/dL — ABNORMAL HIGH (ref 0.0–40.0)

## 2013-04-08 LAB — HEPATIC FUNCTION PANEL
AST: 25 U/L (ref 0–37)
Albumin: 3.7 g/dL (ref 3.5–5.2)
Alkaline Phosphatase: 77 U/L (ref 39–117)
Bilirubin, Direct: 0.1 mg/dL (ref 0.0–0.3)
Total Protein: 7.2 g/dL (ref 6.0–8.3)

## 2013-04-08 LAB — CBC WITH DIFFERENTIAL/PLATELET
Eosinophils Relative: 3.1 % (ref 0.0–5.0)
HCT: 35.2 % — ABNORMAL LOW (ref 36.0–46.0)
Lymphocytes Relative: 37.3 % (ref 12.0–46.0)
Monocytes Relative: 8.1 % (ref 3.0–12.0)
Neutrophils Relative %: 51 % (ref 43.0–77.0)
Platelets: 172 10*3/uL (ref 150.0–400.0)
WBC: 8.1 10*3/uL (ref 4.5–10.5)

## 2013-04-08 LAB — BASIC METABOLIC PANEL
Calcium: 9.8 mg/dL (ref 8.4–10.5)
Chloride: 109 mEq/L (ref 96–112)
Creatinine, Ser: 2.6 mg/dL — ABNORMAL HIGH (ref 0.4–1.2)
Potassium: 4.8 mEq/L (ref 3.5–5.1)

## 2013-04-08 LAB — TSH: TSH: 3.91 u[IU]/mL (ref 0.35–5.50)

## 2013-04-08 LAB — HEMOGLOBIN A1C: Hgb A1c MFr Bld: 6.2 % (ref 4.6–6.5)

## 2013-04-09 ENCOUNTER — Encounter: Payer: Self-pay | Admitting: Internal Medicine

## 2013-04-09 ENCOUNTER — Other Ambulatory Visit: Payer: Self-pay | Admitting: Internal Medicine

## 2013-04-11 ENCOUNTER — Ambulatory Visit: Payer: Self-pay | Admitting: Internal Medicine

## 2013-04-12 NOTE — Telephone Encounter (Signed)
Mailed unread message to pt  

## 2013-04-16 ENCOUNTER — Ambulatory Visit: Payer: Self-pay | Admitting: Internal Medicine

## 2013-04-16 ENCOUNTER — Encounter: Payer: Self-pay | Admitting: Internal Medicine

## 2013-04-16 LAB — HM MAMMOGRAPHY: HM Mammogram: NEGATIVE

## 2013-04-30 ENCOUNTER — Ambulatory Visit: Payer: BC Managed Care – PPO | Admitting: Internal Medicine

## 2013-05-09 ENCOUNTER — Ambulatory Visit (INDEPENDENT_AMBULATORY_CARE_PROVIDER_SITE_OTHER): Payer: BC Managed Care – PPO | Admitting: Internal Medicine

## 2013-05-09 VITALS — BP 110/70 | HR 60 | Temp 98.1°F | Ht 61.0 in | Wt 172.2 lb

## 2013-05-09 DIAGNOSIS — M109 Gout, unspecified: Secondary | ICD-10-CM

## 2013-05-09 DIAGNOSIS — R739 Hyperglycemia, unspecified: Secondary | ICD-10-CM

## 2013-05-09 DIAGNOSIS — D72829 Elevated white blood cell count, unspecified: Secondary | ICD-10-CM

## 2013-05-09 DIAGNOSIS — N189 Chronic kidney disease, unspecified: Secondary | ICD-10-CM

## 2013-05-09 DIAGNOSIS — E781 Pure hyperglyceridemia: Secondary | ICD-10-CM

## 2013-05-09 DIAGNOSIS — R7989 Other specified abnormal findings of blood chemistry: Secondary | ICD-10-CM

## 2013-05-09 DIAGNOSIS — R7309 Other abnormal glucose: Secondary | ICD-10-CM

## 2013-05-09 DIAGNOSIS — R945 Abnormal results of liver function studies: Secondary | ICD-10-CM

## 2013-05-09 DIAGNOSIS — I10 Essential (primary) hypertension: Secondary | ICD-10-CM

## 2013-05-09 NOTE — Progress Notes (Signed)
Pre-visit discussion using our clinic review tool. No additional management support is needed unless otherwise documented below in the visit note.  

## 2013-05-10 ENCOUNTER — Other Ambulatory Visit: Payer: Self-pay | Admitting: Internal Medicine

## 2013-05-12 ENCOUNTER — Encounter: Payer: Self-pay | Admitting: Internal Medicine

## 2013-05-12 NOTE — Progress Notes (Signed)
Subjective:    Patient ID: Angel Collins, female    DOB: 09-11-1960, 53 y.o.   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  She comes in today for a scheduled follow up.   States she is doing well.  The previous GI issues have completely resolved.  Saw GYN.  Had a pelvic ultrasound and found to have an adnexal cyst.  States after evaluation by gyn, no further w/up felt warranted.  Her w/up is complete and she is now on the transplant list for a kidney transplant.  Overall she feels she is doing well.  Blood pressure doing well.  Has not been watching what she eats.  Plans to get more serious about this.  Did go to [facility].        Past Medical History  Diagnosis Date  . Hypertension   . Hypercholesterolemia   . Chronic kidney disease     medullary cystic kidney disease  . Abnormal liver function     fatty liver  . Gout   . Hyperglycemia     Current Outpatient Prescriptions on File Prior to Visit  Medication Sig Dispense Refill  . aspirin 81 MG tablet Take 81 mg by mouth daily.      . enalapril (VASOTEC) 5 MG tablet Take 1 tablet (5 mg total) by mouth daily.  30 tablet  5  . ferrous sulfate 325 (65 FE) MG tablet Take 325 mg by mouth daily.      . metoprolol (LOPRESSOR) 50 MG tablet Take 1 tablet (50 mg total) by mouth 2 (two) times daily.  60 tablet  5  . omega-3 acid ethyl esters (LOVAZA) 1 G capsule Take one tablet qid  120 capsule  5  . PRILOSEC OTC 20 MG tablet TAKE ONE TABLET BY MOUTH EVERY DAY  28 tablet  5  . rosuvastatin (CRESTOR) 5 MG tablet Take 1 tablet (5 mg total) by mouth daily.  30 tablet  5  . vitamin B-12 (CYANOCOBALAMIN) 1000 MCG tablet Take 1,000 mcg by mouth daily.      . vitamin E 400 UNIT capsule Take 400 Units by mouth 2 (two) times daily.       No current facility-administered medications on file prior to  visit.    Review of Systems Patient denies any headache, lightheadedness or dizziness.  No significant sinus or allergy symptoms.  No chest pain, tightness or palpitations.  No increased shortness of breath or congestion.  No nausea or vomiting.  The abdominal discomfort has resolved.   No bowel change, such as diarrhea, constipation, BRBPR or melana.  No urine change.  Blood pressure doing well.  Has seen the nutritionist.  Plans to get more serious about her diet and exercise.          Objective:   Physical Exam  Filed Vitals:   05/09/13 0957  BP: 110/70  Pulse: 60  Temp: 98.1 F (36.7 C)   Blood pressure recheck:  47/57  53 year old female in no acute distress.   HEENT:  Nares- clear.  Oropharynx - without lesions. NECK:  Supple.  Nontender.  No audible bruit.  HEART:  Appears to be regular. LUNGS:  No crackles or wheezing audible.  Respirations even and unlabored.  RADIAL PULSE:  Equal bilaterally.  ABDOMEN:  Soft, nontender.  Bowel sounds present and normal.  No audible  abdominal bruit.   EXTREMITIES:  No increased edema present.  DP pulses palpable and equal bilaterally.      FEET:  Without lesions.       Assessment & Plan:  GI.  Colonoscopy 10/07/08 revealed diverticulosis.  Recommended 10 year follow up colonoscopy.  The previous abdominal discomfort has resolved.  Doing well.    CARDIOVASCULAR.  Stress test 08/18/08 negative.  Continue risk factor modification.  Apparently recently had repeat ECHO at [facility]  States everything checked out fine.  Currently asymptomatic.  Follow.   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  Has been released.  They recommended q 6 months cbc to confirm stable.    HEALTH MAINTENANCE.  Physical 03/26/13.   Pap 03/20/12 negative with negative HPV.  Colonoscopy 10/07/08 revealed diverticulosis.  Recommended follow up colonoscopy in 10 years.  Mammogram 16/15 - per report - ok.  Need results.

## 2013-05-12 NOTE — Assessment & Plan Note (Signed)
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  Seeing nephrology.  Followed by Dr Alfonse Alpers.  Renal function has declined.  Currently on the transplant list.

## 2013-05-12 NOTE — Assessment & Plan Note (Signed)
On Uloric. No flares.

## 2013-05-12 NOTE — Assessment & Plan Note (Signed)
Low carb diet and exercise.  Went to [employer].  Diet, exercise and weight loss.  Follow.

## 2013-05-12 NOTE — Assessment & Plan Note (Signed)
Felt to have fatty liver.  Saw GI recently.  Felt things were stable.  Has completed her hepatitis series.  Follow liver panel.

## 2013-05-12 NOTE — Assessment & Plan Note (Signed)
Now on metoprolol 50mg  in the am and 1/2(50mg ) in the evening.  Pressures doing well.   Follow metabolic panel.

## 2013-05-12 NOTE — Assessment & Plan Note (Signed)
Saw hematology.  Recommended f/u cbc every 6 months.

## 2013-05-12 NOTE — Assessment & Plan Note (Signed)
Low fat diet.  Follow fasting lipid profile.

## 2013-08-01 ENCOUNTER — Other Ambulatory Visit: Payer: Self-pay | Admitting: *Deleted

## 2013-08-01 MED ORDER — OMEGA-3-ACID ETHYL ESTERS 1 G PO CAPS: ORAL_CAPSULE | ORAL | Status: DC

## 2013-08-08 ENCOUNTER — Other Ambulatory Visit (INDEPENDENT_AMBULATORY_CARE_PROVIDER_SITE_OTHER): Payer: BC Managed Care – PPO

## 2013-08-08 DIAGNOSIS — R7309 Other abnormal glucose: Secondary | ICD-10-CM

## 2013-08-08 DIAGNOSIS — E781 Pure hyperglyceridemia: Secondary | ICD-10-CM

## 2013-08-08 DIAGNOSIS — R7989 Other specified abnormal findings of blood chemistry: Secondary | ICD-10-CM

## 2013-08-08 DIAGNOSIS — R739 Hyperglycemia, unspecified: Secondary | ICD-10-CM

## 2013-08-08 DIAGNOSIS — I10 Essential (primary) hypertension: Secondary | ICD-10-CM

## 2013-08-08 DIAGNOSIS — R945 Abnormal results of liver function studies: Secondary | ICD-10-CM

## 2013-08-08 LAB — BASIC METABOLIC PANEL
BUN: 61 mg/dL — ABNORMAL HIGH (ref 6–23)
CALCIUM: 9.9 mg/dL (ref 8.4–10.5)
CO2: 25 meq/L (ref 19–32)
CREATININE: 3.2 mg/dL — AB (ref 0.4–1.2)
Chloride: 101 mEq/L (ref 96–112)
GFR: 15.95 mL/min — AB (ref >60.00)
Glucose, Bld: 111 mg/dL — ABNORMAL HIGH (ref 70–99)
Potassium: 4.7 mEq/L (ref 3.5–5.1)
SODIUM: 136 meq/L (ref 135–145)

## 2013-08-08 LAB — LIPID PANEL
CHOLESTEROL: 179 mg/dL (ref 0–200)
HDL: 43.9 mg/dL (ref >39.00)
LDL Cholesterol: 79 mg/dL (ref 0–99)
Total CHOL/HDL Ratio: 4
Triglycerides: 281 mg/dL — ABNORMAL HIGH (ref 0.0–149.0)
VLDL: 56.2 mg/dL — AB (ref 0.0–40.0)

## 2013-08-08 LAB — HEMOGLOBIN A1C: HEMOGLOBIN A1C: 5.6 % (ref 4.6–6.5)

## 2013-08-08 LAB — HEPATIC FUNCTION PANEL
ALT: 32 U/L (ref 0–35)
AST: 27 U/L (ref 0–37)
Albumin: 4 g/dL (ref 3.5–5.2)
Alkaline Phosphatase: 87 U/L (ref 39–117)
Bilirubin, Direct: 0 mg/dL (ref 0.0–0.3)
TOTAL PROTEIN: 7.9 g/dL (ref 6.0–8.3)
Total Bilirubin: 0.6 mg/dL (ref 0.3–1.2)

## 2013-08-09 ENCOUNTER — Encounter: Payer: Self-pay | Admitting: *Deleted

## 2013-08-14 ENCOUNTER — Ambulatory Visit (INDEPENDENT_AMBULATORY_CARE_PROVIDER_SITE_OTHER): Payer: BC Managed Care – PPO | Admitting: Internal Medicine

## 2013-08-14 ENCOUNTER — Encounter: Payer: Self-pay | Admitting: Internal Medicine

## 2013-08-14 VITALS — BP 102/70 | HR 58 | Temp 98.7°F | Ht 61.0 in | Wt 160.0 lb

## 2013-08-14 DIAGNOSIS — D72829 Elevated white blood cell count, unspecified: Secondary | ICD-10-CM

## 2013-08-14 DIAGNOSIS — R7989 Other specified abnormal findings of blood chemistry: Secondary | ICD-10-CM

## 2013-08-14 DIAGNOSIS — R739 Hyperglycemia, unspecified: Secondary | ICD-10-CM

## 2013-08-14 DIAGNOSIS — N189 Chronic kidney disease, unspecified: Secondary | ICD-10-CM

## 2013-08-14 DIAGNOSIS — R7309 Other abnormal glucose: Secondary | ICD-10-CM

## 2013-08-14 DIAGNOSIS — E781 Pure hyperglyceridemia: Secondary | ICD-10-CM

## 2013-08-14 DIAGNOSIS — M109 Gout, unspecified: Secondary | ICD-10-CM

## 2013-08-14 DIAGNOSIS — R945 Abnormal results of liver function studies: Secondary | ICD-10-CM

## 2013-08-14 DIAGNOSIS — I10 Essential (primary) hypertension: Secondary | ICD-10-CM

## 2013-08-14 NOTE — Patient Instructions (Signed)
Decrease metoprolol to 1/2 tablet twice a day  

## 2013-08-14 NOTE — Progress Notes (Signed)
Pre visit review using our clinic review tool, if applicable. No additional management support is needed unless otherwise documented below in the visit note. 

## 2013-08-18 ENCOUNTER — Encounter: Payer: Self-pay | Admitting: Internal Medicine

## 2013-08-18 NOTE — Assessment & Plan Note (Signed)
Saw hematology.  Recommended f/u cbc every 6 months.

## 2013-08-18 NOTE — Assessment & Plan Note (Signed)
On Uloric. No flares.

## 2013-08-18 NOTE — Assessment & Plan Note (Signed)
Now on metoprolol 50mg  in the am and 1/2(50mg ) in the evening.  Pressures doing well.  Pulse rate as outlined.  Will decrease metoprolol to 50mg  1/2 tablet bid.

## 2013-08-18 NOTE — Assessment & Plan Note (Signed)
Low carb diet and exercise.  Went to [employer].  Diet, exercise and weight loss.  Follow.

## 2013-08-18 NOTE — Assessment & Plan Note (Signed)
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  Seeing nephrology.  Followed by Dr Alfonse Alpers.  Renal function has declined.  Currently on the transplant list.  Last Cr 3.2.  Labs forwarded to Dr Alfonse Alpers.

## 2013-08-18 NOTE — Progress Notes (Signed)
Subjective:    Patient ID: Angel Collins, female    DOB: 16-Feb-1961, 53 y.o.   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  She comes in today for a scheduled follow up.   States she is doing well.  The previous GI issues have completely resolved.  Saw GYN.  Had a pelvic ultrasound and found to have an adnexal cyst.  States after evaluation by gyn, no further w/up felt warranted.  Her w/up is complete and she is now on the transplant list for a kidney transplant.  Overall she feels she is doing well.  Blood pressure doing well.  Did go to [facility].  Has been trying to watch what she eats.  Triglycerides improved.  Tolerating the decrease metoprolol dose.  Pulse better.     Past Medical History  Diagnosis Date  . Hypertension   . Hypercholesterolemia   . Chronic kidney disease     medullary cystic kidney disease  . Abnormal liver function     fatty liver  . Gout   . Hyperglycemia     Current Outpatient Prescriptions on File Prior to Visit  Medication Sig Dispense Refill  . aspirin 81 MG tablet Take 81 mg by mouth daily.      . enalapril (VASOTEC) 5 MG tablet Take 1 tablet (5 mg total) by mouth daily.  30 tablet  5  . ferrous sulfate 325 (65 FE) MG tablet Take 325 mg by mouth daily.      . metoprolol (LOPRESSOR) 50 MG tablet Take 1 tablet (50 mg total) by mouth 2 (two) times daily.  60 tablet  5  . omega-3 acid ethyl esters (LOVAZA) 1 G capsule Take one tablet qid  120 capsule  5  . PRILOSEC OTC 20 MG tablet TAKE ONE TABLET BY MOUTH EVERY DAY  28 tablet  5  . rosuvastatin (CRESTOR) 5 MG tablet Take 1 tablet (5 mg total) by mouth daily.  30 tablet  5  . ULORIC 40 MG tablet TAKE 1 TABLET BY MOUTH EVERY DAY AS DIRECTED  30 tablet  5  . vitamin E 400 UNIT capsule Take 400 Units by mouth 2 (two) times daily.      . vitamin B-12  (CYANOCOBALAMIN) 1000 MCG tablet Take 1,000 mcg by mouth daily.       No current facility-administered medications on file prior to visit.    Review of Systems Patient denies any headache, lightheadedness or dizziness.  No significant sinus or allergy symptoms.  No chest pain, tightness or palpitations.  No increased shortness of breath or congestion.  No nausea or vomiting.  The abdominal discomfort has resolved.   No abdominal pain.  No bowel change, such as diarrhea, constipation, BRBPR or melana.  No urine change.  Blood pressure doing well.  Has seen the nutritionist.  Is watching her diet better now.           Objective:   Physical Exam  Filed Vitals:   08/14/13 0826  BP: 102/70  Pulse: 58  Temp: 98.7 F (37.1 C)   Blood pressure recheck:  114/76 Pulse 48  53 year old female in no acute distress.   HEENT:  Nares- clear.  Oropharynx - without lesions. NECK:  Supple.  Nontender.  No audible bruit.  HEART:  Appears to be regular. LUNGS:  No crackles or wheezing audible.  Respirations even and unlabored.  RADIAL PULSE:  Equal bilaterally.  ABDOMEN:  Soft, nontender.  Bowel sounds present and normal.  No audible abdominal bruit.   EXTREMITIES:  No increased edema present.  DP pulses palpable and equal bilaterally.      FEET:  Without lesions.       Assessment & Plan:  GI.  Colonoscopy 10/07/08 revealed diverticulosis.  Recommended 10 year follow up colonoscopy.  The previous abdominal discomfort has resolved.  Doing well.    CARDIOVASCULAR.  Stress test 08/18/08 negative.  Continue risk factor modification.  Apparently recently had repeat ECHO at [facility].  States everything checked out fine.  Currently asymptomatic.  Follow.   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  Has been released.  They recommended q 6 months cbc to confirm stable.    HEALTH MAINTENANCE.  Physical 03/26/13.   Pap 03/20/12 negative with negative HPV.  Colonoscopy 10/07/08 revealed diverticulosis.  Recommended  follow up colonoscopy in 10 years.  Mammogram 04/16/13 - Birads I.

## 2013-08-18 NOTE — Assessment & Plan Note (Signed)
Low fat/low carb diet.  Follow fasting lipid profile.  Just checked - triglycerides improved.

## 2013-08-18 NOTE — Assessment & Plan Note (Signed)
Felt to have fatty liver.  Saw GI recently.  Felt things were stable.  Has completed her hepatitis series.  Follow liver panel.  Most recent check wnl.

## 2013-09-04 ENCOUNTER — Telehealth: Payer: Self-pay | Admitting: Internal Medicine

## 2013-09-04 NOTE — Telephone Encounter (Signed)
Spoke to [facility].  They do not show receiving labs.  Please fax her 08/08/13 labs to [facility] (Dr Alfonse Alpers) Attn: Donella Stade  - fax:  [phone].  Thanks.

## 2013-09-04 NOTE — Telephone Encounter (Signed)
Labs faxed

## 2013-09-10 ENCOUNTER — Telehealth: Payer: Self-pay | Admitting: *Deleted

## 2013-09-10 NOTE — Telephone Encounter (Signed)
PA request form for the Omega - 3 ethyl esters 1 gm cap placed in Dr.scott box

## 2013-09-20 DIAGNOSIS — Z0279 Encounter for issue of other medical certificate: Secondary | ICD-10-CM

## 2013-09-20 NOTE — Telephone Encounter (Signed)
PA approved efective 08/21/13-09/20/14.

## 2013-09-20 NOTE — Telephone Encounter (Signed)
PA form faxed.  

## 2013-09-23 ENCOUNTER — Encounter: Payer: Self-pay | Admitting: Internal Medicine

## 2013-09-25 ENCOUNTER — Other Ambulatory Visit: Payer: Self-pay | Admitting: Internal Medicine

## 2013-09-27 ENCOUNTER — Telehealth: Payer: Self-pay | Admitting: *Deleted

## 2013-09-27 NOTE — Telephone Encounter (Signed)
errr

## 2013-10-25 ENCOUNTER — Other Ambulatory Visit: Payer: Self-pay | Admitting: Internal Medicine

## 2013-11-06 ENCOUNTER — Other Ambulatory Visit: Payer: Self-pay | Admitting: Internal Medicine

## 2013-11-13 ENCOUNTER — Other Ambulatory Visit: Payer: Self-pay | Admitting: Internal Medicine

## 2013-12-06 ENCOUNTER — Other Ambulatory Visit: Payer: Self-pay | Admitting: Internal Medicine

## 2013-12-18 ENCOUNTER — Encounter: Payer: Self-pay | Admitting: Internal Medicine

## 2013-12-18 ENCOUNTER — Other Ambulatory Visit (INDEPENDENT_AMBULATORY_CARE_PROVIDER_SITE_OTHER): Payer: BC Managed Care – PPO

## 2013-12-18 DIAGNOSIS — E781 Pure hyperglyceridemia: Secondary | ICD-10-CM

## 2013-12-18 DIAGNOSIS — I10 Essential (primary) hypertension: Secondary | ICD-10-CM

## 2013-12-18 DIAGNOSIS — R7309 Other abnormal glucose: Secondary | ICD-10-CM

## 2013-12-18 DIAGNOSIS — R739 Hyperglycemia, unspecified: Secondary | ICD-10-CM

## 2013-12-18 DIAGNOSIS — R7989 Other specified abnormal findings of blood chemistry: Secondary | ICD-10-CM

## 2013-12-18 DIAGNOSIS — D72829 Elevated white blood cell count, unspecified: Secondary | ICD-10-CM

## 2013-12-18 DIAGNOSIS — R945 Abnormal results of liver function studies: Secondary | ICD-10-CM

## 2013-12-18 LAB — LIPID PANEL
CHOL/HDL RATIO: 4
Cholesterol: 181 mg/dL (ref 0–200)
HDL: 41.5 mg/dL (ref >39.00)
NONHDL: 139.5
Triglycerides: 302 mg/dL — ABNORMAL HIGH (ref 0.0–149.0)
VLDL: 60.4 mg/dL — AB (ref 0.0–40.0)

## 2013-12-18 LAB — BASIC METABOLIC PANEL
BUN: 66 mg/dL — AB (ref 6–23)
CO2: 21 meq/L (ref 19–32)
CREATININE: 3.1 mg/dL — AB (ref 0.4–1.2)
Calcium: 9.5 mg/dL (ref 8.4–10.5)
Chloride: 107 mEq/L (ref 96–112)
GFR: 16.77 mL/min — ABNORMAL LOW (ref >60.00)
GLUCOSE: 104 mg/dL — AB (ref 70–99)
POTASSIUM: 4.4 meq/L (ref 3.5–5.1)
Sodium: 139 mEq/L (ref 135–145)

## 2013-12-18 LAB — HEPATIC FUNCTION PANEL
ALK PHOS: 72 U/L (ref 39–117)
ALT: 25 U/L (ref 0–35)
AST: 24 U/L (ref 0–37)
Albumin: 3.6 g/dL (ref 3.5–5.2)
BILIRUBIN DIRECT: 0 mg/dL (ref 0.0–0.3)
BILIRUBIN TOTAL: 0.7 mg/dL (ref 0.2–1.2)
Total Protein: 7.3 g/dL (ref 6.0–8.3)

## 2013-12-18 LAB — CBC WITH DIFFERENTIAL/PLATELET
BASOS PCT: 0.6 % (ref 0.0–3.0)
Basophils Absolute: 0 10*3/uL (ref 0.0–0.1)
Eosinophils Absolute: 0.2 10*3/uL (ref 0.0–0.7)
Eosinophils Relative: 2.7 % (ref 0.0–5.0)
HCT: 33.1 % — ABNORMAL LOW (ref 36.0–46.0)
Hemoglobin: 11.3 g/dL — ABNORMAL LOW (ref 12.0–15.0)
Lymphocytes Relative: 34.9 % (ref 12.0–46.0)
Lymphs Abs: 2.8 10*3/uL (ref 0.7–4.0)
MCHC: 34 g/dL (ref 30.0–36.0)
MCV: 94.6 fl (ref 78.0–100.0)
MONOS PCT: 8 % (ref 3.0–12.0)
Monocytes Absolute: 0.6 10*3/uL (ref 0.1–1.0)
Neutro Abs: 4.3 10*3/uL (ref 1.4–7.7)
Neutrophils Relative %: 53.8 % (ref 43.0–77.0)
Platelets: 196 10*3/uL (ref 150.0–400.0)
RBC: 3.5 Mil/uL — AB (ref 3.87–5.11)
RDW: 12.5 % (ref 11.5–15.5)
WBC: 7.9 10*3/uL (ref 4.0–10.5)

## 2013-12-18 LAB — HEMOGLOBIN A1C: HEMOGLOBIN A1C: 5.9 % (ref 4.6–6.5)

## 2013-12-18 LAB — LDL CHOLESTEROL, DIRECT: Direct LDL: 80.3 mg/dL

## 2013-12-24 ENCOUNTER — Ambulatory Visit (INDEPENDENT_AMBULATORY_CARE_PROVIDER_SITE_OTHER): Payer: BC Managed Care – PPO | Admitting: Internal Medicine

## 2013-12-24 ENCOUNTER — Encounter: Payer: Self-pay | Admitting: Internal Medicine

## 2013-12-24 VITALS — BP 120/70 | HR 53 | Temp 98.2°F | Ht 61.0 in | Wt 159.5 lb

## 2013-12-24 DIAGNOSIS — R7989 Other specified abnormal findings of blood chemistry: Secondary | ICD-10-CM

## 2013-12-24 DIAGNOSIS — I10 Essential (primary) hypertension: Secondary | ICD-10-CM

## 2013-12-24 DIAGNOSIS — D72829 Elevated white blood cell count, unspecified: Secondary | ICD-10-CM

## 2013-12-24 DIAGNOSIS — R739 Hyperglycemia, unspecified: Secondary | ICD-10-CM

## 2013-12-24 DIAGNOSIS — M109 Gout, unspecified: Secondary | ICD-10-CM

## 2013-12-24 DIAGNOSIS — R945 Abnormal results of liver function studies: Secondary | ICD-10-CM

## 2013-12-24 DIAGNOSIS — N184 Chronic kidney disease, stage 4 (severe): Secondary | ICD-10-CM

## 2013-12-24 DIAGNOSIS — N83209 Unspecified ovarian cyst, unspecified side: Secondary | ICD-10-CM

## 2013-12-24 DIAGNOSIS — Z23 Encounter for immunization: Secondary | ICD-10-CM

## 2013-12-24 DIAGNOSIS — D649 Anemia, unspecified: Secondary | ICD-10-CM

## 2013-12-24 DIAGNOSIS — E781 Pure hyperglyceridemia: Secondary | ICD-10-CM

## 2013-12-24 DIAGNOSIS — R7309 Other abnormal glucose: Secondary | ICD-10-CM

## 2013-12-24 NOTE — Progress Notes (Signed)
Subjective:    Patient ID: Angel Collins, female    DOB: 03-02-1961, 53 y.o.   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  She comes in today for a scheduled follow up.   States she is doing well.  The previous GI issues have completely resolved.  Saw GYN.  Had a pelvic ultrasound and found to have an adnexal cyst.  States after the initial evaluation by gyn, no further w/up felt warranted.  Recent renal ultrasound revealed possible cyst enlargement.  She was set up to f/u with gyn at [facility], but comes in today stating she wishes to f/u with Dr Enzo Bi.   She is now on the transplant list for a kidney transplant.  Overall she feels she is doing well.  Blood pressure doing well.  Was low.  She was instructed by her nephrologist to stop her enalapril.  She decreased her dose in half.  Has not stopped.     Past Medical History  Diagnosis Date  . Hypertension   . Hypercholesterolemia   . Chronic kidney disease     medullary cystic kidney disease  . Abnormal liver function     fatty liver  . Gout   . Hyperglycemia     Current Outpatient Prescriptions on File Prior to Visit  Medication Sig Dispense Refill  . aspirin 81 MG tablet Take 81 mg by mouth daily.      . ferrous sulfate 325 (65 FE) MG tablet Take 325 mg by mouth daily.      . metoprolol (LOPRESSOR) 50 MG tablet Take 1 tablet (50 mg total) by mouth 2 (two) times daily.  60 tablet  5  . omega-3 acid ethyl esters (LOVAZA) 1 G capsule Take one tablet qid  120 capsule  5  . PRILOSEC OTC 20 MG tablet TAKE ONE TABLET BY MOUTH EVERY DAY  28 tablet  5  . rosuvastatin (CRESTOR) 5 MG tablet Take 1 tablet (5 mg total) by mouth daily.  30 tablet  5  . ULORIC 40 MG tablet TAKE 1 TABLET BY MOUTH EVERY DAY AS DIRECTED  30 tablet  5  . vitamin B-12 (CYANOCOBALAMIN) 1000 MCG tablet Take 1,000 mcg  by mouth daily.      . vitamin E 400 UNIT capsule Take 400 Units by mouth 2 (two) times daily.       No current facility-administered medications on file prior to visit.    Review of Systems Patient denies any headache, lightheadedness or dizziness.  No significant sinus or allergy symptoms.  No chest pain, tightness or palpitations.  No increased shortness of breath or congestion. No nausea or vomiting.  The abdominal discomfort has resolved.   No abdominal pain.  No bowel change, such as diarrhea, constipation, BRBPR or melana.  No urine change.  Blood pressure doing well.  Has seen the nutritionist.  Is watching her diet better now.   Needs to f/u with gyn as outlined.          Objective:   Physical Exam  Filed Vitals:   12/24/13 0758  BP: 120/70  Pulse: 53  Temp: 98.2 F (36.8 C)   Blood pressure recheck:  124/74 Pulse 32  53 year old female in no acute distress.   HEENT:  Nares- clear.  Oropharynx - without lesions. NECK:  Supple.  Nontender.  No audible  bruit.  HEART:  Appears to be regular. LUNGS:  No crackles or wheezing audible.  Respirations even and unlabored.  RADIAL PULSE:  Equal bilaterally.  ABDOMEN:  Soft, nontender.  Bowel sounds present and normal.  No audible abdominal bruit.   EXTREMITIES:  No increased edema present.  DP pulses palpable and equal bilaterally.      FEET:  Without lesions.       Assessment & Plan:  GI.  Colonoscopy 10/07/08 revealed diverticulosis.  Recommended 10 year follow up colonoscopy.  The previous abdominal discomfort has resolved.  Doing well.    CARDIOVASCULAR.  Stress test 08/18/08 negative.  Continue risk factor modification.  Recently had repeat ECHO at [facility].  States everything checked out fine.  Currently asymptomatic.  Follow.   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  Has been released.  They recommended q 6 months cbc to confirm stable.    HEALTH MAINTENANCE.  Physical 03/26/13.   Pap 03/20/12 negative with negative HPV.   Colonoscopy 10/07/08 revealed diverticulosis.  Recommended follow up colonoscopy in 10 years.  Mammogram 04/16/13 - Birads I.

## 2013-12-24 NOTE — Progress Notes (Signed)
Pre visit review using our clinic review tool, if applicable. No additional management support is needed unless otherwise documented below in the visit note. 

## 2013-12-29 ENCOUNTER — Encounter: Payer: Self-pay | Admitting: Internal Medicine

## 2013-12-29 DIAGNOSIS — N83209 Unspecified ovarian cyst, unspecified side: Secondary | ICD-10-CM | POA: Insufficient documentation

## 2013-12-29 DIAGNOSIS — D649 Anemia, unspecified: Secondary | ICD-10-CM | POA: Insufficient documentation

## 2013-12-29 NOTE — Assessment & Plan Note (Signed)
Saw hematology.  Recommended f/u cbc every 6 months.  White count 12/18/13 - 7.9.

## 2013-12-29 NOTE — Assessment & Plan Note (Signed)
hgb just checked - 11.3.  Follow.

## 2013-12-29 NOTE — Assessment & Plan Note (Signed)
Felt to have fatty liver.  Saw GI.  Felt things were stable.  Has completed her hepatitis series.  Follow liver panel.  Most recent check wnl (12/18/13).

## 2013-12-29 NOTE — Assessment & Plan Note (Signed)
Had originally been evaluated by gyn.  Felt no further w/up warranted.  On recent renal ultrasound, cyst appeared to be larger.  Had referred to gyn at [facility].  Pt prefers to see Dr Enzo Bi.  Referral made.

## 2013-12-29 NOTE — Assessment & Plan Note (Signed)
Low carb diet and exercise.  Went to [employer].  Diet, exercise and weight loss.  Follow.  A1c just checked 12/18/13 - 5.9.

## 2013-12-29 NOTE — Assessment & Plan Note (Signed)
On Uloric. No flares.

## 2013-12-29 NOTE — Assessment & Plan Note (Signed)
Low fat/low carb diet.  Follow fasting lipid profile.  Just checked - triglycerides 302.  Follow.  LDL 80.

## 2013-12-29 NOTE — Assessment & Plan Note (Signed)
Now on metoprolol 50mg  1/2 tablet bid.  Her nephrologist had instructed her to stop her enalapril.  She cut it in 1/2.  Has not stopped.  Blood pressure doing well.  Can stop enalapril.

## 2013-12-29 NOTE — Assessment & Plan Note (Signed)
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  Seeing nephrology.  Followed by Dr Alfonse Alpers.  Renal function has declined.  Currently on the transplant list.

## 2014-02-01 ENCOUNTER — Other Ambulatory Visit: Payer: Self-pay | Admitting: Internal Medicine

## 2014-03-11 ENCOUNTER — Other Ambulatory Visit: Payer: Self-pay | Admitting: Internal Medicine

## 2014-04-21 ENCOUNTER — Other Ambulatory Visit (INDEPENDENT_AMBULATORY_CARE_PROVIDER_SITE_OTHER): Payer: BC Managed Care – PPO

## 2014-04-21 DIAGNOSIS — M109 Gout, unspecified: Secondary | ICD-10-CM

## 2014-04-21 DIAGNOSIS — R7989 Other specified abnormal findings of blood chemistry: Secondary | ICD-10-CM

## 2014-04-21 DIAGNOSIS — N184 Chronic kidney disease, stage 4 (severe): Secondary | ICD-10-CM

## 2014-04-21 DIAGNOSIS — E781 Pure hyperglyceridemia: Secondary | ICD-10-CM

## 2014-04-21 DIAGNOSIS — D649 Anemia, unspecified: Secondary | ICD-10-CM

## 2014-04-21 DIAGNOSIS — R945 Abnormal results of liver function studies: Secondary | ICD-10-CM

## 2014-04-21 LAB — LIPID PANEL
CHOL/HDL RATIO: 4
Cholesterol: 200 mg/dL (ref 0–200)
HDL: 45.5 mg/dL (ref >39.00)
NONHDL: 154.5
TRIGLYCERIDES: 392 mg/dL — AB (ref 0.0–149.0)
VLDL: 78.4 mg/dL — ABNORMAL HIGH (ref 0.0–40.0)

## 2014-04-21 LAB — BASIC METABOLIC PANEL
BUN: 48 mg/dL — ABNORMAL HIGH (ref 6–23)
CHLORIDE: 108 meq/L (ref 96–112)
CO2: 22 meq/L (ref 19–32)
Calcium: 9.3 mg/dL (ref 8.4–10.5)
Creatinine, Ser: 2.2 mg/dL — ABNORMAL HIGH (ref 0.4–1.2)
GFR: 24.22 mL/min — ABNORMAL LOW (ref >60.00)
GLUCOSE: 118 mg/dL — AB (ref 70–99)
Potassium: 4.2 mEq/L (ref 3.5–5.1)
SODIUM: 139 meq/L (ref 135–145)

## 2014-04-21 LAB — CBC WITH DIFFERENTIAL/PLATELET
BASOS ABS: 0.1 10*3/uL (ref 0.0–0.1)
Basophils Relative: 0.6 % (ref 0.0–3.0)
Eosinophils Absolute: 0.3 10*3/uL (ref 0.0–0.7)
Eosinophils Relative: 3.8 % (ref 0.0–5.0)
HCT: 36.2 % (ref 36.0–46.0)
HEMOGLOBIN: 11.9 g/dL — AB (ref 12.0–15.0)
LYMPHS ABS: 3.2 10*3/uL (ref 0.7–4.0)
LYMPHS PCT: 40.5 % (ref 12.0–46.0)
MCHC: 32.9 g/dL (ref 30.0–36.0)
MCV: 93.9 fl (ref 78.0–100.0)
MONOS PCT: 8.6 % (ref 3.0–12.0)
Monocytes Absolute: 0.7 10*3/uL (ref 0.1–1.0)
Neutro Abs: 3.7 10*3/uL (ref 1.4–7.7)
Neutrophils Relative %: 46.5 % (ref 43.0–77.0)
Platelets: 183 10*3/uL (ref 150.0–400.0)
RBC: 3.85 Mil/uL — ABNORMAL LOW (ref 3.87–5.11)
RDW: 12.8 % (ref 11.5–15.5)
WBC: 8 10*3/uL (ref 4.0–10.5)

## 2014-04-21 LAB — HEPATIC FUNCTION PANEL
ALBUMIN: 3.8 g/dL (ref 3.5–5.2)
ALT: 44 U/L — ABNORMAL HIGH (ref 0–35)
AST: 27 U/L (ref 0–37)
Alkaline Phosphatase: 86 U/L (ref 39–117)
Bilirubin, Direct: 0 mg/dL (ref 0.0–0.3)
Total Bilirubin: 0.4 mg/dL (ref 0.2–1.2)
Total Protein: 7.4 g/dL (ref 6.0–8.3)

## 2014-04-21 LAB — LDL CHOLESTEROL, DIRECT: LDL DIRECT: 70.6 mg/dL

## 2014-04-21 LAB — HEMOGLOBIN A1C: Hgb A1c MFr Bld: 6 % (ref 4.6–6.5)

## 2014-04-24 ENCOUNTER — Encounter: Payer: Self-pay | Admitting: Internal Medicine

## 2014-04-25 ENCOUNTER — Other Ambulatory Visit (HOSPITAL_COMMUNITY)
Admission: RE | Admit: 2014-04-25 | Discharge: 2014-04-25 | Disposition: A | Discharge status: Home / Self Care | Payer: BC Managed Care – PPO | Source: Ambulatory Visit | Attending: Internal Medicine | Admitting: Internal Medicine

## 2014-04-25 ENCOUNTER — Ambulatory Visit (INDEPENDENT_AMBULATORY_CARE_PROVIDER_SITE_OTHER): Payer: BC Managed Care – PPO | Admitting: Internal Medicine

## 2014-04-25 ENCOUNTER — Encounter: Payer: Self-pay | Admitting: Internal Medicine

## 2014-04-25 VITALS — BP 110/80 | HR 59 | Temp 98.1°F | Ht 61.0 in | Wt 173.0 lb

## 2014-04-25 DIAGNOSIS — Z01419 Encounter for gynecological examination (general) (routine) without abnormal findings: Secondary | ICD-10-CM | POA: Diagnosis present

## 2014-04-25 DIAGNOSIS — Z1151 Encounter for screening for human papillomavirus (HPV): Secondary | ICD-10-CM | POA: Diagnosis present

## 2014-04-25 DIAGNOSIS — D649 Anemia, unspecified: Secondary | ICD-10-CM

## 2014-04-25 DIAGNOSIS — R739 Hyperglycemia, unspecified: Secondary | ICD-10-CM

## 2014-04-25 DIAGNOSIS — E781 Pure hyperglyceridemia: Secondary | ICD-10-CM

## 2014-04-25 DIAGNOSIS — D72829 Elevated white blood cell count, unspecified: Secondary | ICD-10-CM

## 2014-04-25 DIAGNOSIS — E669 Obesity, unspecified: Secondary | ICD-10-CM

## 2014-04-25 DIAGNOSIS — Z124 Encounter for screening for malignant neoplasm of cervix: Secondary | ICD-10-CM

## 2014-04-25 DIAGNOSIS — I10 Essential (primary) hypertension: Secondary | ICD-10-CM

## 2014-04-25 DIAGNOSIS — Z0000 Encounter for general adult medical examination without abnormal findings: Secondary | ICD-10-CM

## 2014-04-25 DIAGNOSIS — R945 Abnormal results of liver function studies: Secondary | ICD-10-CM

## 2014-04-25 DIAGNOSIS — R7989 Other specified abnormal findings of blood chemistry: Secondary | ICD-10-CM

## 2014-04-25 DIAGNOSIS — Z1239 Encounter for other screening for malignant neoplasm of breast: Secondary | ICD-10-CM

## 2014-04-25 DIAGNOSIS — N184 Chronic kidney disease, stage 4 (severe): Secondary | ICD-10-CM

## 2014-04-25 NOTE — Progress Notes (Signed)
Subjective:    Patient ID: Angel Collins, female    DOB: January 22, 1961, 54 y.o.   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  She comes in today to follow up on these issues as well as for a complete physical exam.   States she is doing well.  The previous GI issues have completely resolved.  Saw GYN.  Had a pelvic ultrasound and found to have an adnexal cyst.  States after the initial evaluation by gyn, no further w/up felt warranted.  Recent renal ultrasound revealed possible cyst enlargement.  She was set up to f/u with gyn at [facility], but requested follow up with Dr Enzo Bi.   She is now on the transplant list for a kidney transplant.  Overall she feels she is doing well.  Blood pressure doing well.  Was low.  She was instructed by her nephrologist to stop her enalapril.  She is off now.  Blood pressure doing well.     Past Medical History  Diagnosis Date  . Hypertension   . Hypercholesterolemia   . Chronic kidney disease     medullary cystic kidney disease  . Abnormal liver function     fatty liver  . Gout   . Hyperglycemia     Current Outpatient Prescriptions on File Prior to Visit  Medication Sig Dispense Refill  . aspirin 81 MG tablet Take 81 mg by mouth daily.    . enalapril (VASOTEC) 5 MG tablet TAKE 1 TABLET (2.5MG TOTAL) BY MOUTH DAILY.    . ferrous sulfate 325 (65 FE) MG tablet Take 325 mg by mouth daily.    . metoprolol (LOPRESSOR) 50 MG tablet Take 1 tablet (50 mg total) by mouth 2 (two) times daily. (Patient taking differently: Take 25 mg by mouth 2 (two) times daily. ) 60 tablet 5  . omega-3 acid ethyl esters (LOVAZA) 1 G capsule Take one tablet qid 120 capsule 5  . PRILOSEC OTC 20 MG tablet TAKE ONE TABLET BY MOUTH EVERY DAY 28 tablet 5  . rosuvastatin (CRESTOR) 5 MG tablet Take 1 tablet (5 mg total) by mouth daily. 30  tablet 5  . ULORIC 40 MG tablet TAKE 1 TABLET BY MOUTH EVERY DAY AS DIRECTED 30 tablet 5  . vitamin E 400 UNIT capsule Take 400 Units by mouth 2 (two) times daily.     No current facility-administered medications on file prior to visit.    Review of Systems Patient denies any headache, lightheadedness or dizziness.  No significant sinus or allergy symptoms.  No chest pain, tightness or palpitations.  No increased shortness of breath or congestion. No nausea or vomiting.  The abdominal discomfort has resolved.   No abdominal pain.  No bowel change, such as diarrhea, constipation, BRBPR or melana.  No urine change.  Blood pressure doing well.  Has seen the nutritionist.  Is watching her diet.  Off her enalapril.  Taking metoprolol 1/2 tablet bid.       Objective:   Physical Exam  Filed Vitals:   04/25/14 0825  BP: 110/80  Pulse: 59  Temp: 98.1 F (36.7 C)   Blood pressure recheck:  63/5  54 year old female in no acute distress.   HEENT:  Nares- clear.  Oropharynx - without lesions. NECK:  Supple.  Nontender.  No audible bruit.  HEART:  Appears to be  regular. LUNGS:  No crackles or wheezing audible.  Respirations even and unlabored.  RADIAL PULSE:  Equal bilaterally.    BREASTS:  No nipple discharge or nipple retraction present.  Could not appreciate any distinct nodules or axillary adenopathy.  ABDOMEN:  Soft, nontender.  Bowel sounds present and normal.  No audible abdominal bruit.  GU:  Normal external genitalia.  Vaginal vault without lesions.  Cervix identified.  Pap performed. Could not appreciate any adnexal masses or tenderness.   RECTAL:  Heme negative.   EXTREMITIES:  No increased edema present.  DP pulses palpable and equal bilaterally.  FEET:  Without lesions.       Assessment & Plan:  1. Breast cancer screening - MM DIGITAL SCREENING BILATERAL; Future  2. Pap smear for cervical cancer screening - Cytology - PAP  3. Obesity (BMI 30-39.9) Diet and exercise.     4. Essential hypertension Blood pressure under good control.  Off enalapril.  On metoprolol 1/2 tablet bid.    5. Chronic kidney disease, stage 4 (severe) Being followed by nephrology.  Most recent cr improved.   6. Anemia, unspecified anemia type hgb stable.  Follow.    7. Leukocytosis Most recent check wnl.    8. Hyperglycemia Low carb diet and exercise.  Follow met  b and a1c.    9. Abnormal liver function tests AST 04/21/14 - slightly elevated.  Follow.    10. Hypertriglyceridemia Low carb diet.  Most recent triglyceride level 392.    11. GI.  Colonoscopy 10/07/08 revealed diverticulosis.  Recommended 10 year follow up colonoscopy.  The previous abdominal discomfort has resolved.  Doing well.    12. CARDIOVASCULAR.  Stress test 08/18/08 negative.  Continue risk factor modification.  Recently had repeat ECHO at [facility].  States everything checked out fine.  Currently asymptomatic.  Follow.   13. 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  Has been released.  They recommended q 6 months cbc to confirm stable.  White coung 04/21/14 - wnl.     HEALTH MAINTENANCE.  Physical today.   Pap 03/20/12 negative with negative HPV.  Repeat pap today.  Colonoscopy 10/07/08 revealed diverticulosis.  Recommended follow up colonoscopy in 10 years.  Mammogram 04/16/13 - Birads I.  Schedule f/u mammogram.

## 2014-04-25 NOTE — Progress Notes (Signed)
Pre visit review using our clinic review tool, if applicable. No additional management support is needed unless otherwise documented below in the visit note. 

## 2014-04-27 ENCOUNTER — Encounter: Payer: Self-pay | Admitting: Internal Medicine

## 2014-04-27 DIAGNOSIS — E669 Obesity, unspecified: Secondary | ICD-10-CM | POA: Insufficient documentation

## 2014-04-28 LAB — CYTOLOGY - PAP

## 2014-04-29 ENCOUNTER — Encounter: Payer: Self-pay | Admitting: Internal Medicine

## 2014-05-03 ENCOUNTER — Other Ambulatory Visit: Payer: Self-pay | Admitting: Internal Medicine

## 2014-05-15 ENCOUNTER — Ambulatory Visit: Payer: Self-pay | Admitting: Internal Medicine

## 2014-05-15 LAB — HM MAMMOGRAPHY: HM Mammogram: NEGATIVE

## 2014-05-31 ENCOUNTER — Other Ambulatory Visit: Payer: Self-pay | Admitting: Internal Medicine

## 2014-06-25 ENCOUNTER — Encounter: Payer: Self-pay | Admitting: Internal Medicine

## 2014-06-25 MED ORDER — OMEGA-3-ACID ETHYL ESTERS 1 G PO CAPS: ORAL_CAPSULE | ORAL | Status: DC

## 2014-07-31 ENCOUNTER — Other Ambulatory Visit: Payer: Self-pay | Admitting: Internal Medicine

## 2014-08-25 ENCOUNTER — Other Ambulatory Visit: Payer: Self-pay | Admitting: Internal Medicine

## 2014-08-25 ENCOUNTER — Other Ambulatory Visit (INDEPENDENT_AMBULATORY_CARE_PROVIDER_SITE_OTHER): Payer: BC Managed Care – PPO

## 2014-08-25 ENCOUNTER — Encounter: Payer: Self-pay | Admitting: Internal Medicine

## 2014-08-25 DIAGNOSIS — R739 Hyperglycemia, unspecified: Secondary | ICD-10-CM

## 2014-08-25 DIAGNOSIS — R945 Abnormal results of liver function studies: Secondary | ICD-10-CM

## 2014-08-25 DIAGNOSIS — R7989 Other specified abnormal findings of blood chemistry: Secondary | ICD-10-CM | POA: Diagnosis not present

## 2014-08-25 DIAGNOSIS — I10 Essential (primary) hypertension: Secondary | ICD-10-CM

## 2014-08-25 DIAGNOSIS — E781 Pure hyperglyceridemia: Secondary | ICD-10-CM | POA: Diagnosis not present

## 2014-08-25 DIAGNOSIS — D72829 Elevated white blood cell count, unspecified: Secondary | ICD-10-CM | POA: Diagnosis not present

## 2014-08-25 LAB — BASIC METABOLIC PANEL
BUN: 54 mg/dL — ABNORMAL HIGH (ref 6–23)
CHLORIDE: 104 meq/L (ref 96–112)
CO2: 25 mEq/L (ref 19–32)
Calcium: 10 mg/dL (ref 8.4–10.5)
Creatinine, Ser: 2.53 mg/dL — ABNORMAL HIGH (ref 0.40–1.20)
GFR: 20.91 mL/min — AB (ref >60.00)
Glucose, Bld: 116 mg/dL — ABNORMAL HIGH (ref 70–99)
Potassium: 4.1 mEq/L (ref 3.5–5.1)
SODIUM: 139 meq/L (ref 135–145)

## 2014-08-25 LAB — CBC WITH DIFFERENTIAL/PLATELET
BASOS PCT: 0.5 % (ref 0.0–3.0)
Basophils Absolute: 0 10*3/uL (ref 0.0–0.1)
EOS ABS: 0.2 10*3/uL (ref 0.0–0.7)
EOS PCT: 2.8 % (ref 0.0–5.0)
HCT: 36 % (ref 36.0–46.0)
Hemoglobin: 12.3 g/dL (ref 12.0–15.0)
LYMPHS PCT: 36.4 % (ref 12.0–46.0)
Lymphs Abs: 3 10*3/uL (ref 0.7–4.0)
MCHC: 34 g/dL (ref 30.0–36.0)
MCV: 91.5 fl (ref 78.0–100.0)
MONO ABS: 0.7 10*3/uL (ref 0.1–1.0)
Monocytes Relative: 8.3 % (ref 3.0–12.0)
NEUTROS PCT: 52 % (ref 43.0–77.0)
Neutro Abs: 4.2 10*3/uL (ref 1.4–7.7)
Platelets: 178 10*3/uL (ref 150.0–400.0)
RBC: 3.94 Mil/uL (ref 3.87–5.11)
RDW: 13.2 % (ref 11.5–15.5)
WBC: 8.1 10*3/uL (ref 4.0–10.5)

## 2014-08-25 LAB — HEPATIC FUNCTION PANEL
ALT: 53 U/L — ABNORMAL HIGH (ref 0–35)
AST: 33 U/L (ref 0–37)
Albumin: 4.2 g/dL (ref 3.5–5.2)
Alkaline Phosphatase: 98 U/L (ref 39–117)
Bilirubin, Direct: 0.1 mg/dL (ref 0.0–0.3)
Total Bilirubin: 0.6 mg/dL (ref 0.2–1.2)
Total Protein: 7.3 g/dL (ref 6.0–8.3)

## 2014-08-25 LAB — LDL CHOLESTEROL, DIRECT: LDL DIRECT: 60 mg/dL

## 2014-08-25 LAB — LIPID PANEL
Cholesterol: 185 mg/dL (ref 0–200)
HDL: 46.7 mg/dL (ref >39.00)
Total CHOL/HDL Ratio: 4
Triglycerides: 481 mg/dL — ABNORMAL HIGH (ref 0.0–149.0)

## 2014-08-25 LAB — HEMOGLOBIN A1C: HEMOGLOBIN A1C: 5.8 % (ref 4.6–6.5)

## 2014-08-28 ENCOUNTER — Other Ambulatory Visit: Payer: Self-pay | Admitting: Internal Medicine

## 2014-08-29 ENCOUNTER — Ambulatory Visit: Payer: BC Managed Care – PPO | Admitting: Internal Medicine

## 2014-09-11 ENCOUNTER — Ambulatory Visit: Payer: BC Managed Care – PPO | Admitting: Internal Medicine

## 2014-09-18 ENCOUNTER — Encounter: Payer: Self-pay | Admitting: Internal Medicine

## 2014-09-18 ENCOUNTER — Ambulatory Visit (INDEPENDENT_AMBULATORY_CARE_PROVIDER_SITE_OTHER): Payer: BC Managed Care – PPO | Admitting: Internal Medicine

## 2014-09-18 VITALS — BP 128/72 | HR 54 | Temp 98.1°F | Ht 61.0 in | Wt 176.0 lb

## 2014-09-18 DIAGNOSIS — R739 Hyperglycemia, unspecified: Secondary | ICD-10-CM

## 2014-09-18 DIAGNOSIS — R7989 Other specified abnormal findings of blood chemistry: Secondary | ICD-10-CM

## 2014-09-18 DIAGNOSIS — E781 Pure hyperglyceridemia: Secondary | ICD-10-CM

## 2014-09-18 DIAGNOSIS — E8881 Metabolic syndrome: Secondary | ICD-10-CM | POA: Insufficient documentation

## 2014-09-18 DIAGNOSIS — Z0000 Encounter for general adult medical examination without abnormal findings: Secondary | ICD-10-CM

## 2014-09-18 DIAGNOSIS — D72829 Elevated white blood cell count, unspecified: Secondary | ICD-10-CM

## 2014-09-18 DIAGNOSIS — R945 Abnormal results of liver function studies: Secondary | ICD-10-CM

## 2014-09-18 DIAGNOSIS — R1013 Epigastric pain: Secondary | ICD-10-CM | POA: Diagnosis not present

## 2014-09-18 DIAGNOSIS — D649 Anemia, unspecified: Secondary | ICD-10-CM

## 2014-09-18 DIAGNOSIS — N184 Chronic kidney disease, stage 4 (severe): Secondary | ICD-10-CM | POA: Diagnosis not present

## 2014-09-18 DIAGNOSIS — I10 Essential (primary) hypertension: Secondary | ICD-10-CM | POA: Diagnosis not present

## 2014-09-18 DIAGNOSIS — E669 Obesity, unspecified: Secondary | ICD-10-CM

## 2014-09-18 NOTE — Progress Notes (Signed)
Pre visit review using our clinic review tool, if applicable. No additional management support is needed unless otherwise documented below in the visit note. 

## 2014-09-18 NOTE — Patient Instructions (Signed)
Zantac (ranididine) 150mg  - take 30 minutes before breakfast.

## 2014-09-18 NOTE — Progress Notes (Signed)
Patient ID: Angel Collins, female   DOB: [birth year], 54 y.o.   MRN: [id number]   Subjective:    Patient ID: Angel Collins, female    DOB: [birth year], 54 y.o.   MRN: [id number]  HPI  Patient here for a scheduled follow up.  She has noticed some epigastric discomfort when eating.  This occurs intermittently.  When eats a bigger meal - notices more.  No acid reflux.  Discussed diet and exercise.  Walking.  No cardiac symptoms with increased activity or exertion.  Breathing stable.  Bowels stable.  Triglycerides increased.  Discussed diet and exercise.  Still following up with nephrology.     Past Medical History  Diagnosis Date  . Hypertension   . Hypercholesterolemia   . Chronic kidney disease     medullary cystic kidney disease  . Abnormal liver function     fatty liver  . Gout   . Hyperglycemia     Current Outpatient Prescriptions on File Prior to Visit  Medication Sig Dispense Refill  . aspirin 81 MG tablet Take 81 mg by mouth daily.    . CRESTOR 5 MG tablet TAKE 1 TABLET BY MOUTH EVERY DAY 30 tablet 5  . enalapril (VASOTEC) 5 MG tablet TAKE 1 TABLET (2.5MG TOTAL) BY MOUTH DAILY.    . ferrous sulfate 325 (65 FE) MG tablet Take 325 mg by mouth daily.    . metoprolol (LOPRESSOR) 50 MG tablet TAKE 1 TABLET (50 MG TOTAL) BY MOUTH 2 (TWO) TIMES DAILY. 60 tablet 5  . omega-3 acid ethyl esters (LOVAZA) 1 G capsule Take one tablet qid 120 capsule 5  . rosuvastatin (CRESTOR) 5 MG tablet Take 1 tablet (5 mg total) by mouth daily. 30 tablet 5  . ULORIC 40 MG tablet TAKE 1 TABLET BY MOUTH EVERY DAY AS DIRECTED 30 tablet 5  . vitamin E 400 UNIT capsule Take 400 Units by mouth 2 (two) times daily.     No current facility-administered medications on file prior to visit.    Review of Systems  Constitutional: Negative for appetite change and unexpected weight change.  HENT: Negative for congestion and sinus pressure.   Respiratory: Negative for cough, chest tightness and shortness of breath.    Cardiovascular: Negative for chest pain, palpitations and leg swelling.  Gastrointestinal: Positive for abdominal pain (epigastric pain intermittently with eating - as outlined.  notices with a bigger meal. ). Negative for nausea, vomiting and diarrhea.  Genitourinary: Negative for dysuria and difficulty urinating.  Musculoskeletal: Negative for back pain and joint swelling.  Skin: Negative for color change and rash.  Neurological: Negative for dizziness, light-headedness and headaches.  Psychiatric/Behavioral: Negative for dysphoric mood and agitation.       Objective:    Physical Exam  Constitutional: She appears well-developed and well-nourished. No distress.  HENT:  Nose: Nose normal.  Mouth/Throat: Oropharynx is clear and moist.  Neck: Neck supple. No thyromegaly present.  Cardiovascular: Normal rate and regular rhythm.   Pulmonary/Chest: Breath sounds normal. No respiratory distress. She has no wheezes.  Abdominal: Soft. Bowel sounds are normal. There is no tenderness.  Musculoskeletal: She exhibits no edema or tenderness.  Lymphadenopathy:    She has no cervical adenopathy.  Skin: No rash noted. No erythema.  Psychiatric: She has a normal mood and affect. Her behavior is normal.    BP 128/72 mmHg  Pulse 54  Temp(Src) 98.1 F (36.7 C) (Oral)  Ht '5\' 1"'  (1.549 m)  Wt 176 lb (79.833 kg)  BMI 33.27 kg/m2  SpO2 97% Wt Readings from Last 3 Encounters:  09/18/14 176 lb (79.833 kg)  04/25/14 173 lb (78.472 kg)  12/24/13 159 lb 8 oz (72.349 kg)     Lab Results  Component Value Date   WBC 8.1 08/25/2014   HGB 12.3 08/25/2014   HCT 36.0 08/25/2014   PLT 178.0 08/25/2014   GLUCOSE 116* 08/25/2014   CHOL 185 08/25/2014   TRIG * 08/25/2014    481.0 Triglyceride is over 400; calculations on Lipids are invalid.   HDL 46.70 08/25/2014   LDLDIRECT 60.0 08/25/2014   LDLCALC 79 08/08/2013   ALT 53* 08/25/2014   AST 33 08/25/2014   NA 139 08/25/2014   K 4.1 08/25/2014    CL 104 08/25/2014   CREATININE 2.53* 08/25/2014   BUN 54* 08/25/2014   CO2 25 08/25/2014   TSH 3.91 04/08/2013   HGBA1C 5.8 08/25/2014       Assessment & Plan:   Problem List Items Addressed This Visit    Abdominal pain - Primary    Epigastric pain as outlined.  Occurs with eating - especially eating a larger meal.  Check abdominal ultrasound.  On prilosec.        Relevant Orders   US Abdomen Complete (Completed)   Abnormal liver function tests    Felt to have fatty liver.  Saw GI.  Felt things were stable previously.  Recheck liver panel.  With the epigastric pain, schedule abdominal ultrasound.        Anemia    Follow cbc.        Chronic kidney disease    Seeing nephrology.  On transplant list.  Follow renal function.  Kidney function has been stable.        Health care maintenance   Hyperglycemia    Low carb diet and exercise.  Follow met b and a1c.       Hypertension    Blood pressure under good control.  Follow pressures.  Follow metabolic panel.        Hypertriglyceridemia    Increased triglycerides.  Discussed diet and exercise.  Follow lipid panel.        Leukocytosis    White count last checked wnl.        Obesity (BMI 30-39.9)    Diet and exercise.           I spent 25 minutes with the patient and more than 50% of the time was spent in consultation regarding the above.    Angel Pheasant, MD

## 2014-09-23 ENCOUNTER — Telehealth: Payer: Self-pay | Admitting: *Deleted

## 2014-09-23 NOTE — Telephone Encounter (Signed)
Fax from pharmacy, needing PA for Omega-3 Ethyl esters 1 gm cap. Started online and approved. Pharmacy notified.

## 2014-09-26 ENCOUNTER — Ambulatory Visit
Admission: RE | Admit: 2014-09-26 | Discharge: 2014-09-26 | Disposition: A | Discharge status: Home / Self Care | Payer: BC Managed Care – PPO | Source: Ambulatory Visit | Attending: Internal Medicine | Admitting: Internal Medicine

## 2014-09-26 DIAGNOSIS — R1013 Epigastric pain: Secondary | ICD-10-CM | POA: Insufficient documentation

## 2014-09-29 ENCOUNTER — Encounter: Payer: Self-pay | Admitting: Internal Medicine

## 2014-09-29 DIAGNOSIS — R7989 Other specified abnormal findings of blood chemistry: Secondary | ICD-10-CM

## 2014-09-29 DIAGNOSIS — R945 Abnormal results of liver function studies: Secondary | ICD-10-CM

## 2014-09-29 DIAGNOSIS — Z0000 Encounter for general adult medical examination without abnormal findings: Secondary | ICD-10-CM | POA: Insufficient documentation

## 2014-09-29 NOTE — Assessment & Plan Note (Signed)
Low carb diet and exercise.  Follow met b and a1c.  

## 2014-09-29 NOTE — Assessment & Plan Note (Signed)
Felt to have fatty liver.  Saw GI.  Felt things were stable previously.  Recheck liver panel.  With the epigastric pain, schedule abdominal ultrasound.

## 2014-09-29 NOTE — Assessment & Plan Note (Signed)
Diet and exercise.   

## 2014-09-29 NOTE — Telephone Encounter (Signed)
Order placed for GI referral.   

## 2014-09-29 NOTE — Assessment & Plan Note (Signed)
Increased triglycerides.  Discussed diet and exercise.  Follow lipid panel.

## 2014-09-29 NOTE — Assessment & Plan Note (Signed)
Blood pressure under good control.  Follow pressures.  Follow metabolic panel.   

## 2014-09-29 NOTE — Assessment & Plan Note (Signed)
Epigastric pain as outlined.  Occurs with eating - especially eating a larger meal.  Check abdominal ultrasound.  On prilosec.

## 2014-09-29 NOTE — Assessment & Plan Note (Signed)
Seeing nephrology.  On transplant list.  Follow renal function.  Kidney function has been stable.

## 2014-09-29 NOTE — Assessment & Plan Note (Signed)
White count last checked wnl.

## 2014-09-29 NOTE — Assessment & Plan Note (Signed)
Follow cbc.  

## 2014-10-24 ENCOUNTER — Other Ambulatory Visit: Payer: Self-pay | Admitting: Internal Medicine

## 2014-11-11 ENCOUNTER — Ambulatory Visit: Payer: BC Managed Care – PPO | Admitting: Internal Medicine

## 2014-11-21 ENCOUNTER — Other Ambulatory Visit: Payer: Self-pay | Admitting: Internal Medicine

## 2014-11-26 ENCOUNTER — Ambulatory Visit: Payer: Self-pay | Admitting: Internal Medicine

## 2014-12-03 ENCOUNTER — Telehealth: Payer: Self-pay | Admitting: Internal Medicine

## 2014-12-03 DIAGNOSIS — D649 Anemia, unspecified: Secondary | ICD-10-CM

## 2014-12-03 DIAGNOSIS — N184 Chronic kidney disease, stage 4 (severe): Secondary | ICD-10-CM

## 2014-12-03 DIAGNOSIS — I10 Essential (primary) hypertension: Secondary | ICD-10-CM

## 2014-12-03 DIAGNOSIS — R739 Hyperglycemia, unspecified: Secondary | ICD-10-CM

## 2014-12-03 DIAGNOSIS — R945 Abnormal results of liver function studies: Secondary | ICD-10-CM

## 2014-12-03 DIAGNOSIS — R7989 Other specified abnormal findings of blood chemistry: Secondary | ICD-10-CM

## 2014-12-03 DIAGNOSIS — E781 Pure hyperglyceridemia: Secondary | ICD-10-CM

## 2014-12-03 NOTE — Telephone Encounter (Signed)
She had labs in May.  You can go ahead and schedule her a f/u appt (55min).  If she wants to get labs prior - schedule lab appt 1-2 days before f/u appt and I will place the orders.

## 2014-12-03 NOTE — Telephone Encounter (Signed)
Pt would like to know if she is due for lab work before I resch her appt? Let me know. Thank You! Have a [place] Day!

## 2014-12-04 NOTE — Telephone Encounter (Signed)
Good morning,  Pt does want to have labs done before her appt. Need lab orders. Thank You!

## 2014-12-04 NOTE — Telephone Encounter (Signed)
Order placed for labs. Needs lab appt

## 2014-12-04 NOTE — Telephone Encounter (Signed)
Please enter lab orders.

## 2015-01-27 ENCOUNTER — Other Ambulatory Visit (INDEPENDENT_AMBULATORY_CARE_PROVIDER_SITE_OTHER): Payer: BC Managed Care – PPO

## 2015-01-27 DIAGNOSIS — R7989 Other specified abnormal findings of blood chemistry: Secondary | ICD-10-CM

## 2015-01-27 DIAGNOSIS — E781 Pure hyperglyceridemia: Secondary | ICD-10-CM | POA: Diagnosis not present

## 2015-01-27 DIAGNOSIS — R739 Hyperglycemia, unspecified: Secondary | ICD-10-CM | POA: Diagnosis not present

## 2015-01-27 DIAGNOSIS — N184 Chronic kidney disease, stage 4 (severe): Secondary | ICD-10-CM

## 2015-01-27 DIAGNOSIS — D649 Anemia, unspecified: Secondary | ICD-10-CM

## 2015-01-27 DIAGNOSIS — R945 Abnormal results of liver function studies: Secondary | ICD-10-CM

## 2015-01-27 LAB — BASIC METABOLIC PANEL
BUN: 52 mg/dL — AB (ref 6–23)
CHLORIDE: 106 meq/L (ref 96–112)
CO2: 25 mEq/L (ref 19–32)
Calcium: 10 mg/dL (ref 8.4–10.5)
Creatinine, Ser: 2.35 mg/dL — ABNORMAL HIGH (ref 0.40–1.20)
GFR: 22.74 mL/min — ABNORMAL LOW (ref >60.00)
GLUCOSE: 121 mg/dL — AB (ref 70–99)
POTASSIUM: 4.5 meq/L (ref 3.5–5.1)
Sodium: 141 mEq/L (ref 135–145)

## 2015-01-27 LAB — HEPATIC FUNCTION PANEL
ALBUMIN: 4.4 g/dL (ref 3.5–5.2)
ALT: 75 U/L — ABNORMAL HIGH (ref 0–35)
AST: 57 U/L — ABNORMAL HIGH (ref 0–37)
Alkaline Phosphatase: 98 U/L (ref 39–117)
Bilirubin, Direct: 0.1 mg/dL (ref 0.0–0.3)
Total Bilirubin: 0.5 mg/dL (ref 0.2–1.2)
Total Protein: 7.3 g/dL (ref 6.0–8.3)

## 2015-01-27 LAB — LIPID PANEL
CHOL/HDL RATIO: 3
CHOLESTEROL: 165 mg/dL (ref 0–200)
HDL: 47.8 mg/dL (ref >39.00)
NonHDL: 117.14
TRIGLYCERIDES: 278 mg/dL — AB (ref 0.0–149.0)
VLDL: 55.6 mg/dL — AB (ref 0.0–40.0)

## 2015-01-27 LAB — HEMOGLOBIN A1C: Hgb A1c MFr Bld: 6.1 % (ref 4.6–6.5)

## 2015-01-27 LAB — TSH: TSH: 3.52 u[IU]/mL (ref 0.35–4.50)

## 2015-01-27 LAB — LDL CHOLESTEROL, DIRECT: LDL DIRECT: 61 mg/dL

## 2015-01-28 ENCOUNTER — Encounter: Payer: Self-pay | Admitting: Internal Medicine

## 2015-01-30 ENCOUNTER — Other Ambulatory Visit: Payer: Self-pay | Admitting: Internal Medicine

## 2015-01-30 ENCOUNTER — Ambulatory Visit (INDEPENDENT_AMBULATORY_CARE_PROVIDER_SITE_OTHER): Payer: BC Managed Care – PPO | Admitting: Internal Medicine

## 2015-01-30 ENCOUNTER — Encounter: Payer: Self-pay | Admitting: Internal Medicine

## 2015-01-30 VITALS — BP 138/90 | HR 58 | Temp 98.5°F | Resp 18 | Ht 61.0 in | Wt 175.2 lb

## 2015-01-30 DIAGNOSIS — E781 Pure hyperglyceridemia: Secondary | ICD-10-CM

## 2015-01-30 DIAGNOSIS — R945 Abnormal results of liver function studies: Secondary | ICD-10-CM

## 2015-01-30 DIAGNOSIS — N83209 Unspecified ovarian cyst, unspecified side: Secondary | ICD-10-CM | POA: Diagnosis not present

## 2015-01-30 DIAGNOSIS — I10 Essential (primary) hypertension: Secondary | ICD-10-CM

## 2015-01-30 DIAGNOSIS — Z23 Encounter for immunization: Secondary | ICD-10-CM

## 2015-01-30 DIAGNOSIS — E669 Obesity, unspecified: Secondary | ICD-10-CM

## 2015-01-30 DIAGNOSIS — D72829 Elevated white blood cell count, unspecified: Secondary | ICD-10-CM

## 2015-01-30 DIAGNOSIS — R7989 Other specified abnormal findings of blood chemistry: Secondary | ICD-10-CM | POA: Diagnosis not present

## 2015-01-30 DIAGNOSIS — R739 Hyperglycemia, unspecified: Secondary | ICD-10-CM

## 2015-01-30 DIAGNOSIS — N184 Chronic kidney disease, stage 4 (severe): Secondary | ICD-10-CM | POA: Diagnosis not present

## 2015-01-30 DIAGNOSIS — D649 Anemia, unspecified: Secondary | ICD-10-CM

## 2015-01-30 NOTE — Patient Instructions (Signed)

## 2015-01-30 NOTE — Progress Notes (Signed)
Pre-visit discussion using our clinic review tool. No additional management support is needed unless otherwise documented below in the visit note.  

## 2015-01-30 NOTE — Progress Notes (Signed)
Patient ID: Angel Collins, female   DOB: [birth year], 54 y.o.   MRN: [id number]   Subjective:    Patient ID: Angel Collins, female    DOB: [birth year], 54 y.o.   MRN: [id number]  HPI  Patient with past history of hypertension, chronic kidney disease, fatty liver, hypercholesterolemia and hyperglycemia who comes in today to follow up on these issues.  She is followed by Dr Mathis Fare for her kidneys.  She is on the transplant list.  Has been placed inactive now since kidney function tests have been stable.  Tries to stay active. Triglycerides have improved.  Discussed diet and exercise. No cardiac symptoms with increased activity or exertion.  No sob.  No acid reflux.  No abdominal pain or cramping.  Bowels stable.      Past Medical History  Diagnosis Date  . Hypertension   . Hypercholesterolemia   . Chronic kidney disease     medullary cystic kidney disease  . Abnormal liver function     fatty liver  . Gout   . Hyperglycemia    Past Surgical History  Procedure Laterality Date  . Ovarian cyst removed  1979  . Cesarean section  1983  . Tubal ligation  1987   Family History  Problem Relation Age of Onset  . Lung cancer Mother     died age 79  . Liver cancer Mother   . Liver cancer Father   . Hypertension Brother   . Hypercholesterolemia Brother   . Gout Brother    Social History   Social History  . Marital Status: Married    Spouse Name: N/A  . Number of Children: 3  . Years of Education: N/A   Social History Main Topics  . Smoking status: Never Smoker   . Smokeless tobacco: Never Used  . Alcohol Use: 0.0 oz/week    0 Standard drinks or equivalent per week     Comment: occasional  . Drug Use: No  . Sexual Activity: Not Asked   Other Topics Concern  . None   Social History Narrative    Outpatient Encounter Prescriptions as of 01/30/2015  Medication Sig  . aspirin 81 MG tablet Take 81 mg by mouth daily.  . enalapril (VASOTEC) 5 MG tablet TAKE 1 TABLET (2.5MG TOTAL)  BY MOUTH DAILY.  . ferrous sulfate 325 (65 FE) MG tablet Take 325 mg by mouth daily.  . metoprolol (LOPRESSOR) 50 MG tablet TAKE 1 TABLET (50 MG TOTAL) BY MOUTH 2 (TWO) TIMES DAILY.  Marland Kitchen omega-3 acid ethyl esters (LOVAZA) 1 G capsule Take one tablet qid  . rosuvastatin (CRESTOR) 5 MG tablet Take 1 tablet (5 mg total) by mouth daily.  . rosuvastatin (CRESTOR) 5 MG tablet TAKE 1 TABLET BY MOUTH DAILY  . ULORIC 40 MG tablet TAKE 1 TABLET BY MOUTH EVERY DAY AS DIRECTED  . vitamin E 400 UNIT capsule Take 400 Units by mouth 2 (two) times daily.   No facility-administered encounter medications on file as of 01/30/2015.    Review of Systems  Constitutional: Negative for appetite change and unexpected weight change.  HENT: Negative for congestion and sinus pressure.   Eyes: Negative for discharge and visual disturbance.  Respiratory: Negative for cough, chest tightness and shortness of breath.   Cardiovascular: Negative for chest pain, palpitations and leg swelling.  Gastrointestinal: Negative for nausea, vomiting, abdominal pain and diarrhea.  Genitourinary: Negative for dysuria and difficulty urinating.  Musculoskeletal: Negative for back pain and joint swelling.  Skin:  Negative for color change and rash.  Neurological: Negative for dizziness, light-headedness and headaches.  Psychiatric/Behavioral: Negative for dysphoric mood and agitation.       Objective:     Blood pressure rechecked by me:  118/72  Physical Exam  Constitutional: She appears well-developed and well-nourished. No distress.  HENT:  Nose: Nose normal.  Mouth/Throat: Oropharynx is clear and moist.  Eyes: Conjunctivae are normal. Right eye exhibits no discharge. Left eye exhibits no discharge.  Neck: Neck supple. No thyromegaly present.  Cardiovascular: Normal rate and regular rhythm.   Pulmonary/Chest: Breath sounds normal. No respiratory distress. She has no wheezes.  Abdominal: Soft. Bowel sounds are normal. There is  no tenderness.  Musculoskeletal: She exhibits no edema or tenderness.  Lymphadenopathy:    She has no cervical adenopathy.  Skin: No rash noted. No erythema.  Psychiatric: She has a normal mood and affect. Her behavior is normal.    BP 138/90 mmHg  Pulse 58  Temp(Src) 98.5 F (36.9 C) (Oral)  Resp 18  Ht _0  (1.549 m)  Wt 175 lb 4 oz (79.493 kg)  BMI 33.13 kg/m2  SpO2 94% Wt Readings from Last 3 Encounters:  01/30/15 175 lb 4 oz (79.493 kg)  09/18/14 176 lb (79.833 kg)  04/25/14 173 lb (78.472 kg)     Lab Results  Component Value Date   WBC 8.1 08/25/2014   HGB 12.3 08/25/2014   HCT 36.0 08/25/2014   PLT 178.0 08/25/2014   GLUCOSE 121* 01/27/2015   CHOL 165 01/27/2015   TRIG 278.0* 01/27/2015   HDL 47.80 01/27/2015   LDLDIRECT 61.0 01/27/2015   LDLCALC 79 08/08/2013   ALT 75* 01/27/2015   AST 57* 01/27/2015   NA 141 01/27/2015   K 4.5 01/27/2015   CL 106 01/27/2015   CREATININE 2.35* 01/27/2015   BUN 52* 01/27/2015   CO2 25 01/27/2015   TSH 3.52 01/27/2015   HGBA1C 6.1 01/27/2015    US Abdomen Complete  09/26/2014  CLINICAL DATA:  Epigastric pain. EXAM: ULTRASOUND ABDOMEN COMPLETE COMPARISON:  08/21/2012. FINDINGS: Gallbladder: No gallstones or wall thickening visualized. No sonographic Murphy sign noted. Common bile duct: Diameter: 2.0 mm Liver: Liver is echogenic suggesting fatty infiltration and/or hepatocellular disease. IVC: No abnormality visualized. Pancreas: Visualized portion unremarkable. Spleen: Size and appearance within normal limits. Right Kidney: Length: 11.2 cm. Echogenicity within normal limits. No hydronephrosis visualized. 8 mm simple cyst. Left Kidney: Length: 9.3 cm. Left renal cortical thinning. No hydronephrosis visualized. 1.5 cm simple cyst. Abdominal aorta: No aneurysm visualized. Other findings: None. IMPRESSION: 1. Echogenic liver suggesting fatty infiltration and/or hepatocellular disease. No evidence of gallstones or biliary distention.  2. Stable changes of left renal atrophy with cortical thinning. Simple bilateral renal cysts are present . Electronically Signed   By: Marcello Moores  Register   On: 09/26/2014 09:04       Assessment & Plan:   Problem List Items Addressed This Visit    Abnormal liver function tests    Felt to have fatty liver.  Saw GI.  Felt things were stable previously.  Recent liver panel elevated.  Abdominal ultrasound as outlined.  Has f/u with GI next week.        Anemia    Follow cbc.        Relevant Orders   Hepatic function panel   Chronic kidney disease    Sees Dr Mathis Fare.  Has been on transplant list.  Now inactive since kidney function has been stable.  Follow.  Hyperglycemia    Low carb diet and exercise.  Follow met b and a1c.        Relevant Orders   Hemoglobin A1c   Hypertension    Blood pressure under good control.  Continue same medication regimen.  Follow pressures.  Follow metabolic panel.        Relevant Orders   Basic metabolic panel   Hypertriglyceridemia    Low cholesterol diet and exercise.  Follow lipid panel.  On crestor. Triglycerides have improved.  Follow liver function tests.        Relevant Orders   Lipid panel   Leukocytosis    08/25/14 - white count wnl.        Obesity (BMI 30-39.9)    Diet and exercise.        Ovarian cyst    Refer to Dr DeFrancesco's note.  Follow.         Other Visit Diagnoses    Encounter for immunization    -  Primary        Einar Pheasant, MD

## 2015-02-01 ENCOUNTER — Encounter: Payer: Self-pay | Admitting: Internal Medicine

## 2015-02-01 NOTE — Assessment & Plan Note (Signed)
Blood pressure under good control.  Continue same medication regimen.  Follow pressures.  Follow metabolic panel.   

## 2015-02-01 NOTE — Assessment & Plan Note (Signed)
Sees Dr Mathis Fare.  Has been on transplant list.  Now inactive since kidney function has been stable.  Follow.

## 2015-02-01 NOTE — Assessment & Plan Note (Signed)
Follow cbc.  

## 2015-02-01 NOTE — Assessment & Plan Note (Signed)
Felt to have fatty liver.  Saw GI.  Felt things were stable previously.  Recent liver panel elevated.  Abdominal ultrasound as outlined.  Has f/u with GI next week.

## 2015-02-01 NOTE — Assessment & Plan Note (Signed)
Low carb diet and exercise.  Follow met b and a1c.   

## 2015-02-01 NOTE — Assessment & Plan Note (Signed)
Refer to Dr DeFrancesco's note.  Follow.

## 2015-02-01 NOTE — Assessment & Plan Note (Signed)
08/25/14 - white count wnl.

## 2015-02-01 NOTE — Assessment & Plan Note (Signed)
Diet and exercise.   

## 2015-02-01 NOTE — Assessment & Plan Note (Signed)
Low cholesterol diet and exercise.  Follow lipid panel.  On crestor. Triglycerides have improved.  Follow liver function tests.

## 2015-02-03 ENCOUNTER — Other Ambulatory Visit: Payer: Self-pay | Admitting: Gastroenterology

## 2015-02-03 DIAGNOSIS — R7989 Other specified abnormal findings of blood chemistry: Secondary | ICD-10-CM

## 2015-02-03 DIAGNOSIS — K760 Fatty (change of) liver, not elsewhere classified: Secondary | ICD-10-CM

## 2015-02-03 DIAGNOSIS — R945 Abnormal results of liver function studies: Secondary | ICD-10-CM

## 2015-02-06 ENCOUNTER — Ambulatory Visit: Payer: BC Managed Care – PPO

## 2015-02-11 ENCOUNTER — Ambulatory Visit
Admission: RE | Admit: 2015-02-11 | Discharge: 2015-02-11 | Disposition: A | Discharge status: Home / Self Care | Payer: BC Managed Care – PPO | Source: Ambulatory Visit | Attending: Gastroenterology | Admitting: Gastroenterology

## 2015-02-11 DIAGNOSIS — K7689 Other specified diseases of liver: Secondary | ICD-10-CM | POA: Insufficient documentation

## 2015-02-11 DIAGNOSIS — R7989 Other specified abnormal findings of blood chemistry: Secondary | ICD-10-CM | POA: Diagnosis present

## 2015-02-11 DIAGNOSIS — R945 Abnormal results of liver function studies: Secondary | ICD-10-CM

## 2015-02-11 DIAGNOSIS — K760 Fatty (change of) liver, not elsewhere classified: Secondary | ICD-10-CM | POA: Insufficient documentation

## 2015-02-13 ENCOUNTER — Other Ambulatory Visit: Payer: Self-pay | Admitting: Internal Medicine

## 2015-04-03 ENCOUNTER — Other Ambulatory Visit: Payer: Self-pay | Admitting: Internal Medicine

## 2015-05-12 ENCOUNTER — Other Ambulatory Visit: Payer: Self-pay | Admitting: Internal Medicine

## 2015-05-12 NOTE — Telephone Encounter (Signed)
lovaza filled

## 2015-05-14 ENCOUNTER — Other Ambulatory Visit: Payer: Self-pay | Admitting: Internal Medicine

## 2015-06-04 ENCOUNTER — Other Ambulatory Visit (INDEPENDENT_AMBULATORY_CARE_PROVIDER_SITE_OTHER): Payer: BC Managed Care – PPO

## 2015-06-04 DIAGNOSIS — R739 Hyperglycemia, unspecified: Secondary | ICD-10-CM

## 2015-06-04 DIAGNOSIS — I10 Essential (primary) hypertension: Secondary | ICD-10-CM | POA: Diagnosis not present

## 2015-06-04 DIAGNOSIS — E781 Pure hyperglyceridemia: Secondary | ICD-10-CM | POA: Diagnosis not present

## 2015-06-04 DIAGNOSIS — D649 Anemia, unspecified: Secondary | ICD-10-CM | POA: Diagnosis not present

## 2015-06-04 LAB — HEPATIC FUNCTION PANEL
ALBUMIN: 4.9 g/dL (ref 3.5–5.2)
ALT: 92 U/L — ABNORMAL HIGH (ref 0–35)
AST: 78 U/L — ABNORMAL HIGH (ref 0–37)
Alkaline Phosphatase: 117 U/L (ref 39–117)
BILIRUBIN DIRECT: 0.1 mg/dL (ref 0.0–0.3)
TOTAL PROTEIN: 8.1 g/dL (ref 6.0–8.3)
Total Bilirubin: 0.5 mg/dL (ref 0.2–1.2)

## 2015-06-04 LAB — LIPID PANEL
CHOLESTEROL: 182 mg/dL (ref 0–200)
HDL: 46.6 mg/dL (ref >39.00)
NONHDL: 135.18
TRIGLYCERIDES: 302 mg/dL — AB (ref 0.0–149.0)
Total CHOL/HDL Ratio: 4
VLDL: 60.4 mg/dL — ABNORMAL HIGH (ref 0.0–40.0)

## 2015-06-04 LAB — BASIC METABOLIC PANEL
BUN: 56 mg/dL — AB (ref 6–23)
CALCIUM: 10.2 mg/dL (ref 8.4–10.5)
CO2: 26 meq/L (ref 19–32)
CREATININE: 2.34 mg/dL — AB (ref 0.40–1.20)
Chloride: 104 mEq/L (ref 96–112)
GFR: 22.82 mL/min — ABNORMAL LOW (ref >60.00)
GLUCOSE: 125 mg/dL — AB (ref 70–99)
Potassium: 4.6 mEq/L (ref 3.5–5.1)
Sodium: 140 mEq/L (ref 135–145)

## 2015-06-04 LAB — LDL CHOLESTEROL, DIRECT: Direct LDL: 66 mg/dL

## 2015-06-04 LAB — HEMOGLOBIN A1C: Hgb A1c MFr Bld: 6.1 % (ref 4.6–6.5)

## 2015-06-05 ENCOUNTER — Encounter: Payer: Self-pay | Admitting: Internal Medicine

## 2015-06-08 ENCOUNTER — Ambulatory Visit
Admission: RE | Admit: 2015-06-08 | Discharge: 2015-06-08 | Disposition: A | Discharge status: Home / Self Care | Payer: BC Managed Care – PPO | Source: Ambulatory Visit | Attending: Internal Medicine | Admitting: Internal Medicine

## 2015-06-08 ENCOUNTER — Encounter: Payer: Self-pay | Admitting: Internal Medicine

## 2015-06-08 ENCOUNTER — Ambulatory Visit (INDEPENDENT_AMBULATORY_CARE_PROVIDER_SITE_OTHER): Payer: BC Managed Care – PPO | Admitting: Internal Medicine

## 2015-06-08 ENCOUNTER — Other Ambulatory Visit (HOSPITAL_COMMUNITY)
Admission: RE | Admit: 2015-06-08 | Discharge: 2015-06-08 | Disposition: A | Discharge status: Home / Self Care | Payer: BC Managed Care – PPO | Source: Ambulatory Visit | Attending: Internal Medicine | Admitting: Internal Medicine

## 2015-06-08 VITALS — BP 126/72 | HR 68 | Temp 98.1°F | Ht 61.0 in | Wt 177.2 lb

## 2015-06-08 DIAGNOSIS — R7989 Other specified abnormal findings of blood chemistry: Secondary | ICD-10-CM | POA: Diagnosis not present

## 2015-06-08 DIAGNOSIS — Z1151 Encounter for screening for human papillomavirus (HPV): Secondary | ICD-10-CM | POA: Insufficient documentation

## 2015-06-08 DIAGNOSIS — R945 Abnormal results of liver function studies: Secondary | ICD-10-CM

## 2015-06-08 DIAGNOSIS — Z0000 Encounter for general adult medical examination without abnormal findings: Secondary | ICD-10-CM

## 2015-06-08 DIAGNOSIS — Z1231 Encounter for screening mammogram for malignant neoplasm of breast: Secondary | ICD-10-CM | POA: Insufficient documentation

## 2015-06-08 DIAGNOSIS — Z01419 Encounter for gynecological examination (general) (routine) without abnormal findings: Secondary | ICD-10-CM | POA: Diagnosis present

## 2015-06-08 DIAGNOSIS — R739 Hyperglycemia, unspecified: Secondary | ICD-10-CM

## 2015-06-08 DIAGNOSIS — Z1211 Encounter for screening for malignant neoplasm of colon: Secondary | ICD-10-CM

## 2015-06-08 DIAGNOSIS — Z1239 Encounter for other screening for malignant neoplasm of breast: Secondary | ICD-10-CM

## 2015-06-08 DIAGNOSIS — I10 Essential (primary) hypertension: Secondary | ICD-10-CM

## 2015-06-08 DIAGNOSIS — E781 Pure hyperglyceridemia: Secondary | ICD-10-CM

## 2015-06-08 DIAGNOSIS — Z124 Encounter for screening for malignant neoplasm of cervix: Secondary | ICD-10-CM

## 2015-06-08 DIAGNOSIS — E669 Obesity, unspecified: Secondary | ICD-10-CM

## 2015-06-08 DIAGNOSIS — D649 Anemia, unspecified: Secondary | ICD-10-CM

## 2015-06-08 DIAGNOSIS — N184 Chronic kidney disease, stage 4 (severe): Secondary | ICD-10-CM

## 2015-06-08 MED ORDER — METOPROLOL TARTRATE 50 MG PO TABS
ORAL_TABLET | ORAL | Status: DC
Start: 2015-06-08 — End: 2016-06-10

## 2015-06-08 NOTE — Assessment & Plan Note (Signed)
Has decreased iron to qod.  Recheck cbc and ferritin.  Adjust if needed.

## 2015-06-08 NOTE — Assessment & Plan Note (Signed)
Low carb diet and exercise.  Follow met b and a1c.   

## 2015-06-08 NOTE — Assessment & Plan Note (Signed)
Discussed diet and exercise.  Follow.  

## 2015-06-08 NOTE — Addendum Note (Signed)
Addended by: Leeanne Rio on: 06/08/2015 09:15 AM   Modules accepted: Orders, SmartSet

## 2015-06-08 NOTE — Assessment & Plan Note (Signed)
Followed by nephrology.  Still on transplant list, but inactive.  Kidney function stable.  Follow.

## 2015-06-08 NOTE — Assessment & Plan Note (Addendum)
Physical today 06/08/15.  PAP 03/20/12 - negative with negative HPV.  Mammogram scheduled.  Colonoscopy 09/2008.  Recommend f/u colonoscopy in 10 years.  Pap today.   Hemoccult cards given.

## 2015-06-08 NOTE — Progress Notes (Signed)
Pre visit review using our clinic review tool, if applicable. No additional management support is needed unless otherwise documented below in the visit note. 

## 2015-06-08 NOTE — Assessment & Plan Note (Signed)
Felt to have fatty liver.  Saw GI.  Felt things were stable.  Seeing GI.  Slightly increased from last check on most recent check.  Discussed diet and exercise.  Discussed weight loss.  Follow.

## 2015-06-08 NOTE — Assessment & Plan Note (Signed)
Low cholesterol diet and exercise.  Low car diet.  Follow lipid panel and liver function tests.  On crestor.  LDL in the 60s.  Follow.

## 2015-06-08 NOTE — Assessment & Plan Note (Signed)
Off enalapril.  Blood pressure doing well.  Follow.

## 2015-06-08 NOTE — Progress Notes (Signed)
Patient ID: LATARSHIA JERSEY, female   DOB: [birth year], 55 y.o.   MRN: [id number]   Subjective:    Patient ID: [name], female    DOB: March 08, 1961, 55 y.o.   MRN: [id number]  HPI  Patient with past history of CKD followed by nephrology, hypercholesterolemia, abnormal liver function tests and hypertension.  She comes in today to follow up on these issues as well as for a complete physical exam.  She reports she is doing well.  Not exercising regularly.  Plans to start.  Tries to stay active.  No cardiac symptoms with increased activity or exertion.  No sob.  No acid reflux.  No abdominal pain or cramping.  Bowels stable.  Still on transplant list, but inactive.  Kidney function has been stable.    Past Medical History  Diagnosis Date  . Hypertension   . Hypercholesterolemia   . Chronic kidney disease     medullary cystic kidney disease  . Abnormal liver function     fatty liver  . Gout   . Hyperglycemia    Past Surgical History  Procedure Laterality Date  . Ovarian cyst removed  1979  . Cesarean section  1983  . Tubal ligation  1987   Family History  Problem Relation Age of Onset  . Lung cancer Mother     died age 33  . Liver cancer Mother   . Liver cancer Father   . Hypertension Brother   . Hypercholesterolemia Brother   . Gout Brother    Social History   Social History  . Marital Status: Married    Spouse Name: N/A  . Number of Children: 3  . Years of Education: N/A   Social History Main Topics  . Smoking status: Never Smoker   . Smokeless tobacco: Never Used  . Alcohol Use: 0.0 oz/week    0 Standard drinks or equivalent per week     Comment: occasional  . Drug Use: No  . Sexual Activity: Not Asked   Other Topics Concern  . None   Social History Narrative    Outpatient Encounter Prescriptions as of 06/08/2015  Medication Sig  . aspirin 81 MG tablet Take 81 mg by mouth daily.  . ferrous sulfate 325 (65 FE) MG tablet Take 325 mg by mouth daily.  .  metoprolol (LOPRESSOR) 50 MG tablet 1/2 tablet bid  . omega-3 acid ethyl esters (LOVAZA) 1 g capsule TAKE ONE CAPSULE BY MOUTH 4 TIMES A DAY  . PRILOSEC OTC 20 MG tablet TAKE 1 TABLET BY MOUTH EVERY DAY  . rosuvastatin (CRESTOR) 5 MG tablet TAKE 1 TABLET BY MOUTH DAILY  . ULORIC 40 MG tablet TAKE 1 TABLET BY MOUTH EVERY DAY AS DIRECTED  . vitamin E 400 UNIT capsule Take 400 Units by mouth 2 (two) times daily.  . [DISCONTINUED] metoprolol (LOPRESSOR) 50 MG tablet TAKE 1 TABLET (50 MG TOTAL) BY MOUTH 2 (TWO) TIMES DAILY.  . [DISCONTINUED] rosuvastatin (CRESTOR) 5 MG tablet Take 1 tablet (5 mg total) by mouth daily.  . [DISCONTINUED] enalapril (VASOTEC) 5 MG tablet Reported on 06/08/2015   No facility-administered encounter medications on file as of 06/08/2015.    Review of Systems  Constitutional: Negative for appetite change and unexpected weight change.  HENT: Negative for congestion and sinus pressure.   Eyes: Negative for pain and visual disturbance.  Respiratory: Negative for cough, chest tightness and shortness of breath.   Cardiovascular: Negative for chest pain, palpitations and leg swelling.  Gastrointestinal:  Negative for nausea, vomiting, abdominal pain and diarrhea.  Genitourinary: Negative for frequency and difficulty urinating.  Musculoskeletal: Negative for back pain and joint swelling.  Skin: Negative for color change and rash.  Neurological: Negative for dizziness, light-headedness and headaches.  Hematological: Negative for adenopathy. Does not bruise/bleed easily.  Psychiatric/Behavioral: Negative for dysphoric mood and agitation.       Objective:     Blood pressure rechecked by me:  134/70  Physical Exam  Constitutional: She is oriented to person, place, and time. She appears well-developed and well-nourished. No distress.  HENT:  Nose: Nose normal.  Mouth/Throat: Oropharynx is clear and moist.  Eyes: Right eye exhibits no discharge. Left eye exhibits no  discharge. No scleral icterus.  Neck: Neck supple. No thyromegaly present.  Cardiovascular: Normal rate and regular rhythm.   Pulmonary/Chest: Breath sounds normal. No accessory muscle usage. No tachypnea. No respiratory distress. She has no decreased breath sounds. She has no wheezes. She has no rhonchi. Right breast exhibits no inverted nipple, no mass, no nipple discharge and no tenderness (no axillary adenopathy). Left breast exhibits no inverted nipple, no mass, no nipple discharge and no tenderness (no axilarry adenopathy).  Abdominal: Soft. Bowel sounds are normal. There is no tenderness.  Genitourinary:  Normal external genitalia.  Vaginal vault without lesions.  Cervix identified.  Pap smear performed.  Could not appreciate any adnexal masses or tenderness.  Rectal exam - heme negative.   Musculoskeletal: She exhibits no edema or tenderness.  Lymphadenopathy:    She has no cervical adenopathy.  Neurological: She is alert and oriented to person, place, and time.  Skin: Skin is warm. No rash noted. No erythema.  Psychiatric: She has a normal mood and affect. Her behavior is normal.    BP 126/72 mmHg  Pulse 68  Temp(Src) 98.1 F (36.7 C) (Oral)  Ht '5\' 1"'  (1.549 m)  Wt 177 lb 3.2 oz (80.377 kg)  BMI 33.50 kg/m2  SpO2 96% Wt Readings from Last 3 Encounters:  06/08/15 177 lb 3.2 oz (80.377 kg)  01/30/15 175 lb 4 oz (79.493 kg)  09/18/14 176 lb (79.833 kg)     Lab Results  Component Value Date   WBC 8.1 08/25/2014   HGB 12.3 08/25/2014   HCT 36.0 08/25/2014   PLT 178.0 08/25/2014   GLUCOSE 125* 06/04/2015   CHOL 182 06/04/2015   TRIG 302.0* 06/04/2015   HDL 46.60 06/04/2015   LDLDIRECT 66.0 06/04/2015   LDLCALC 79 08/08/2013   ALT 92* 06/04/2015   AST 78* 06/04/2015   NA 140 06/04/2015   K 4.6 06/04/2015   CL 104 06/04/2015   CREATININE 2.34* 06/04/2015   BUN 56* 06/04/2015   CO2 26 06/04/2015   TSH 3.52 01/27/2015   HGBA1C 6.1 06/04/2015       Assessment &  Plan:   Problem List Items Addressed This Visit    Abnormal liver function tests    Felt to have fatty liver.  Saw GI.  Felt things were stable.  Seeing GI.  Slightly increased from last check on most recent check.  Discussed diet and exercise.  Discussed weight loss.  Follow.        Relevant Orders   Hepatic function panel   Anemia    Has decreased iron to qod.  Recheck cbc and ferritin.  Adjust if needed.       Relevant Orders   CBC with Differential/Platelet   Ferritin   IBC panel   Chronic kidney disease  Followed by nephrology.  Still on transplant list, but inactive.  Kidney function stable.  Follow.        Health care maintenance    Physical today 06/08/15.  PAP 03/20/12 - negative with negative HPV.  Mammogram scheduled.  Colonoscopy 09/2008.  Recommend f/u colonoscopy in 10 years.  Pap today.   Hemoccult cards given.       Hyperglycemia    Low carb diet and exercise.  Follow met b and a1c.        Hypertension    Off enalapril.  Blood pressure doing well.  Follow.        Relevant Medications   metoprolol (LOPRESSOR) 50 MG tablet   Hypertriglyceridemia    Low cholesterol diet and exercise.  Low car diet.  Follow lipid panel and liver function tests.  On crestor.  LDL in the 60s.  Follow.        Relevant Medications   metoprolol (LOPRESSOR) 50 MG tablet   Obesity (BMI 30-39.9)    Discussed diet and exercise.  Follow.        Other Visit Diagnoses    Routine general medical examination at a health care facility    -  Primary    Breast cancer screening        Relevant Orders    MM DIGITAL SCREENING BILATERAL    Colon cancer screening        Relevant Orders    Fecal occult blood, imunochemical        Einar Pheasant, MD

## 2015-06-09 LAB — CYTOLOGY - PAP

## 2015-06-10 ENCOUNTER — Encounter: Payer: Self-pay | Admitting: Internal Medicine

## 2015-07-19 ENCOUNTER — Other Ambulatory Visit: Payer: Self-pay | Admitting: Internal Medicine

## 2015-08-05 ENCOUNTER — Other Ambulatory Visit (INDEPENDENT_AMBULATORY_CARE_PROVIDER_SITE_OTHER): Payer: BC Managed Care – PPO

## 2015-08-05 DIAGNOSIS — R7989 Other specified abnormal findings of blood chemistry: Secondary | ICD-10-CM | POA: Diagnosis not present

## 2015-08-05 DIAGNOSIS — R945 Abnormal results of liver function studies: Secondary | ICD-10-CM

## 2015-08-05 DIAGNOSIS — Z1211 Encounter for screening for malignant neoplasm of colon: Secondary | ICD-10-CM

## 2015-08-05 DIAGNOSIS — D649 Anemia, unspecified: Secondary | ICD-10-CM | POA: Diagnosis not present

## 2015-08-05 LAB — CBC WITH DIFFERENTIAL/PLATELET
BASOS ABS: 0 10*3/uL (ref 0.0–0.1)
Basophils Relative: 0.4 % (ref 0.0–3.0)
EOS ABS: 0.2 10*3/uL (ref 0.0–0.7)
Eosinophils Relative: 3.2 % (ref 0.0–5.0)
HEMATOCRIT: 37.2 % (ref 36.0–46.0)
HEMOGLOBIN: 12.5 g/dL (ref 12.0–15.0)
LYMPHS PCT: 41.8 % (ref 12.0–46.0)
Lymphs Abs: 3 10*3/uL (ref 0.7–4.0)
MCHC: 33.6 g/dL (ref 30.0–36.0)
MCV: 93 fl (ref 78.0–100.0)
MONO ABS: 0.6 10*3/uL (ref 0.1–1.0)
Monocytes Relative: 8.5 % (ref 3.0–12.0)
Neutro Abs: 3.3 10*3/uL (ref 1.4–7.7)
Neutrophils Relative %: 46.1 % (ref 43.0–77.0)
Platelets: 175 10*3/uL (ref 150.0–400.0)
RBC: 4 Mil/uL (ref 3.87–5.11)
RDW: 13.5 % (ref 11.5–15.5)
WBC: 7.2 10*3/uL (ref 4.0–10.5)

## 2015-08-05 LAB — HEPATIC FUNCTION PANEL
ALT: 56 U/L — AB (ref 0–35)
AST: 36 U/L (ref 0–37)
Albumin: 4.3 g/dL (ref 3.5–5.2)
Alkaline Phosphatase: 90 U/L (ref 39–117)
BILIRUBIN DIRECT: 0.1 mg/dL (ref 0.0–0.3)
BILIRUBIN TOTAL: 0.5 mg/dL (ref 0.2–1.2)
Total Protein: 7.3 g/dL (ref 6.0–8.3)

## 2015-08-05 LAB — IBC PANEL
IRON: 67 ug/dL (ref 42–145)
Saturation Ratios: 17.7 % — ABNORMAL LOW (ref 20.0–50.0)
TRANSFERRIN: 271 mg/dL (ref 212.0–360.0)

## 2015-08-05 LAB — FERRITIN: FERRITIN: 438.2 ng/mL — AB (ref 10.0–291.0)

## 2015-08-22 IMAGING — MG MM DIGITAL SCREENING BILAT W/ CAD
1 series · 4 of 4 positions shown · non-contrast
Comparison: Previous exam(s).

CLINICAL DATA: Screening.

EXAM:
DIGITAL SCREENING BILATERAL MAMMOGRAM WITH CAD

[R CC · right · 4 of 4 slices shown]
[im 1/4]
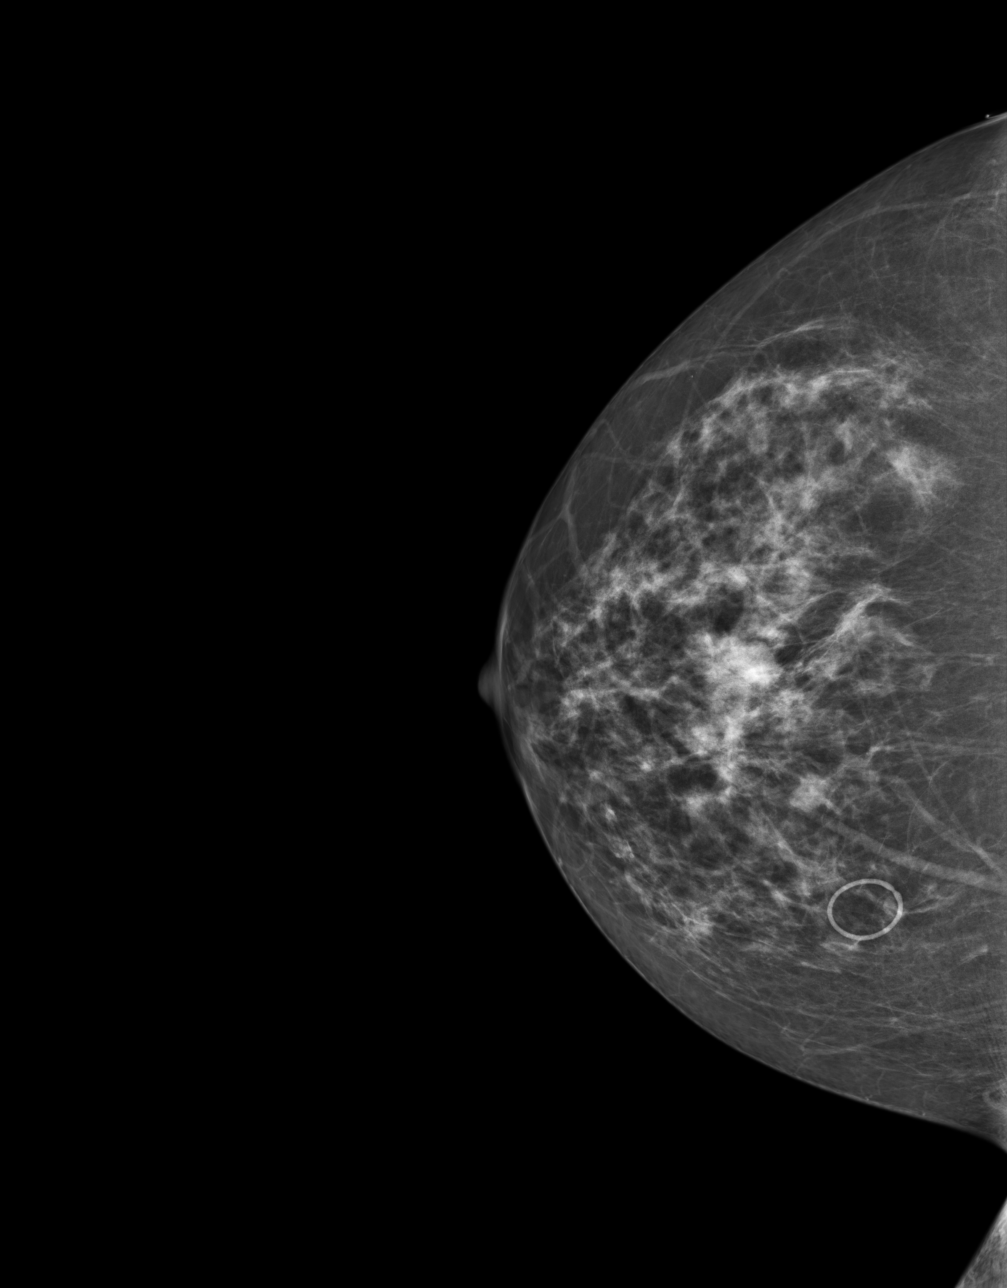
[im 2/4]
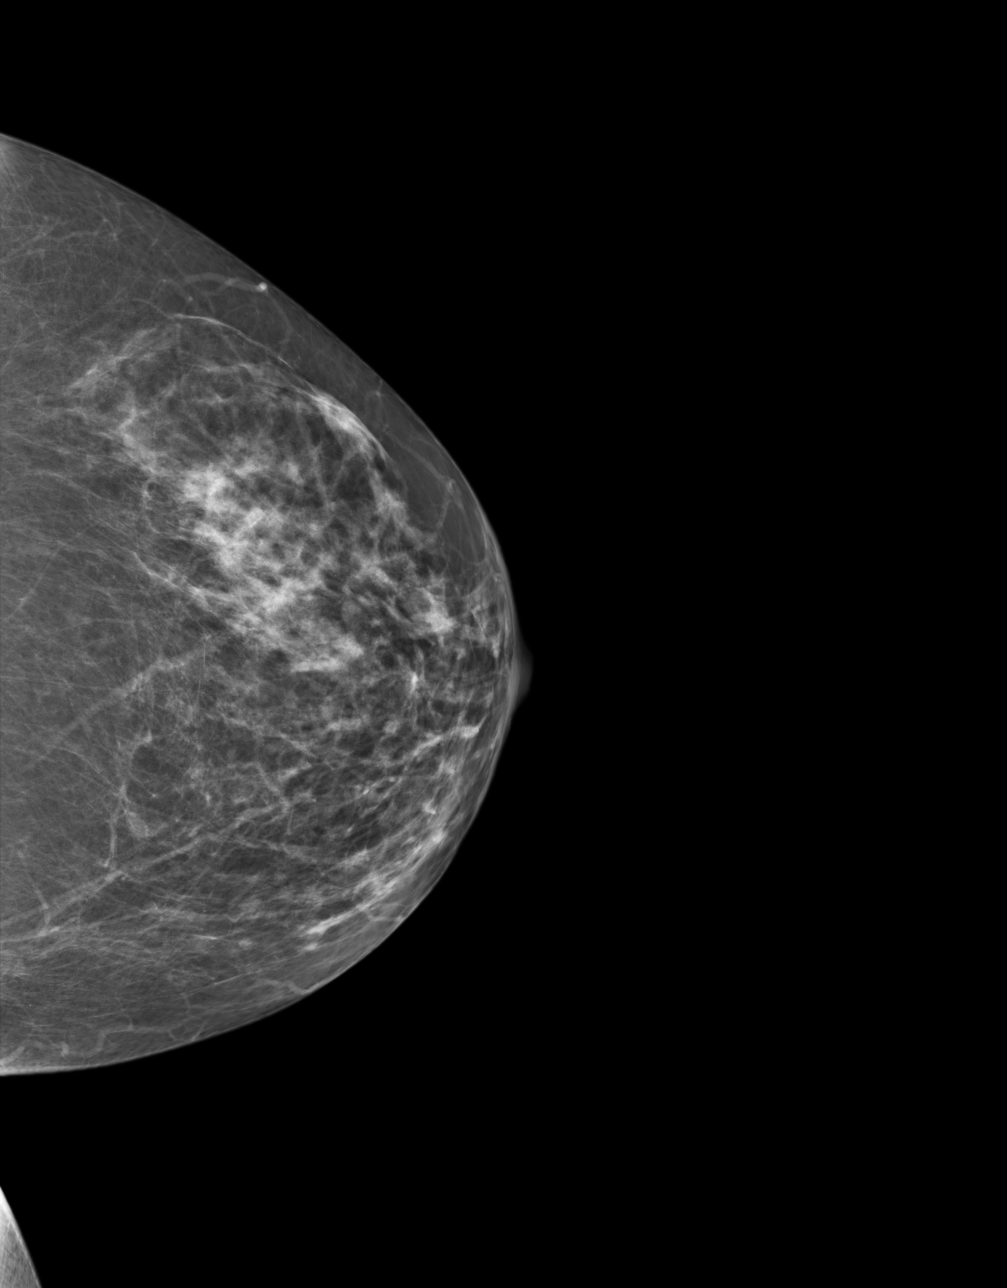
[im 3/4]
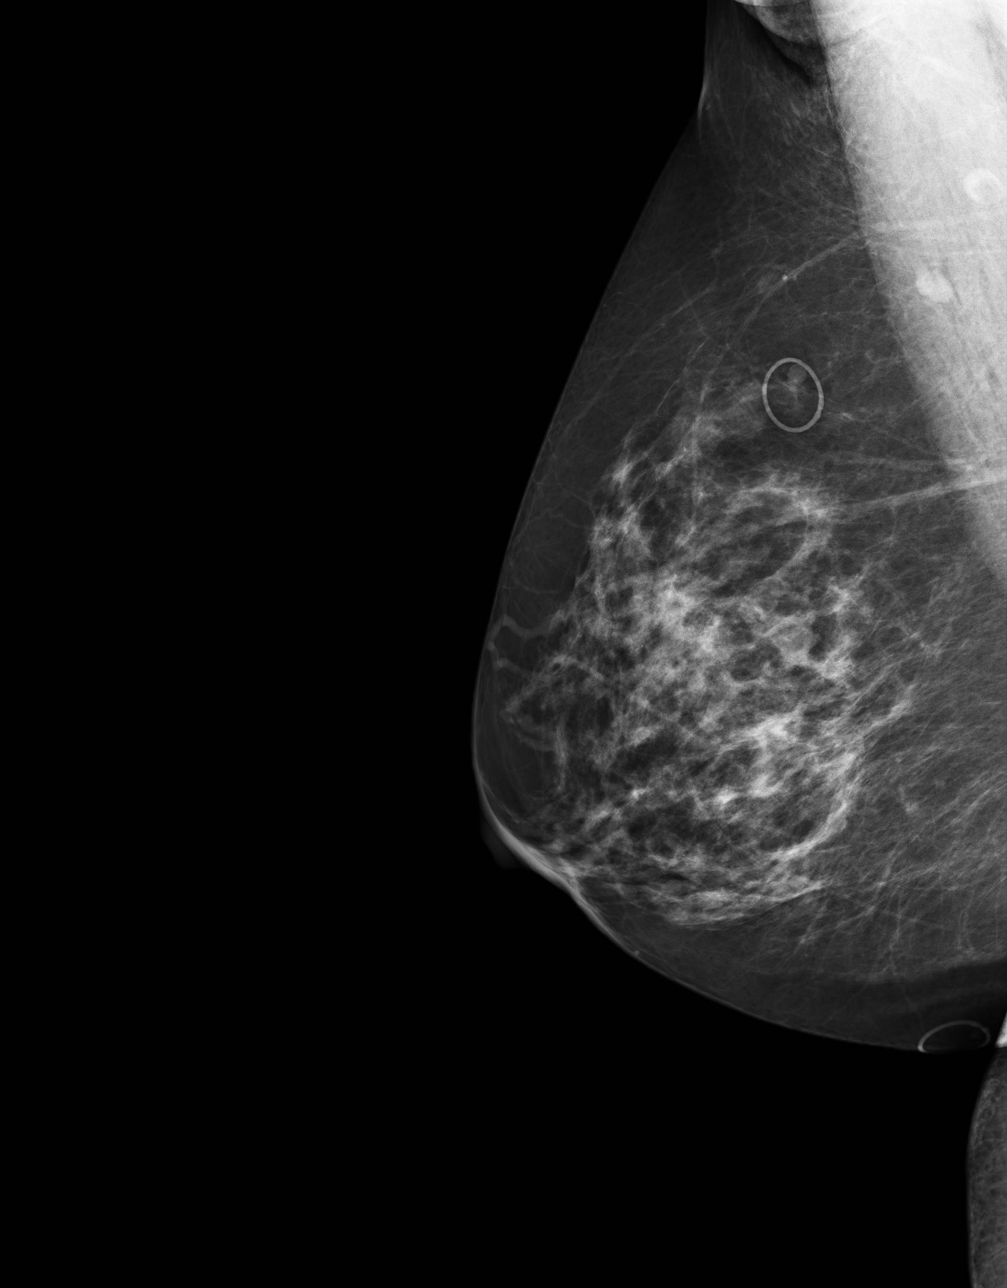
[im 4/4]
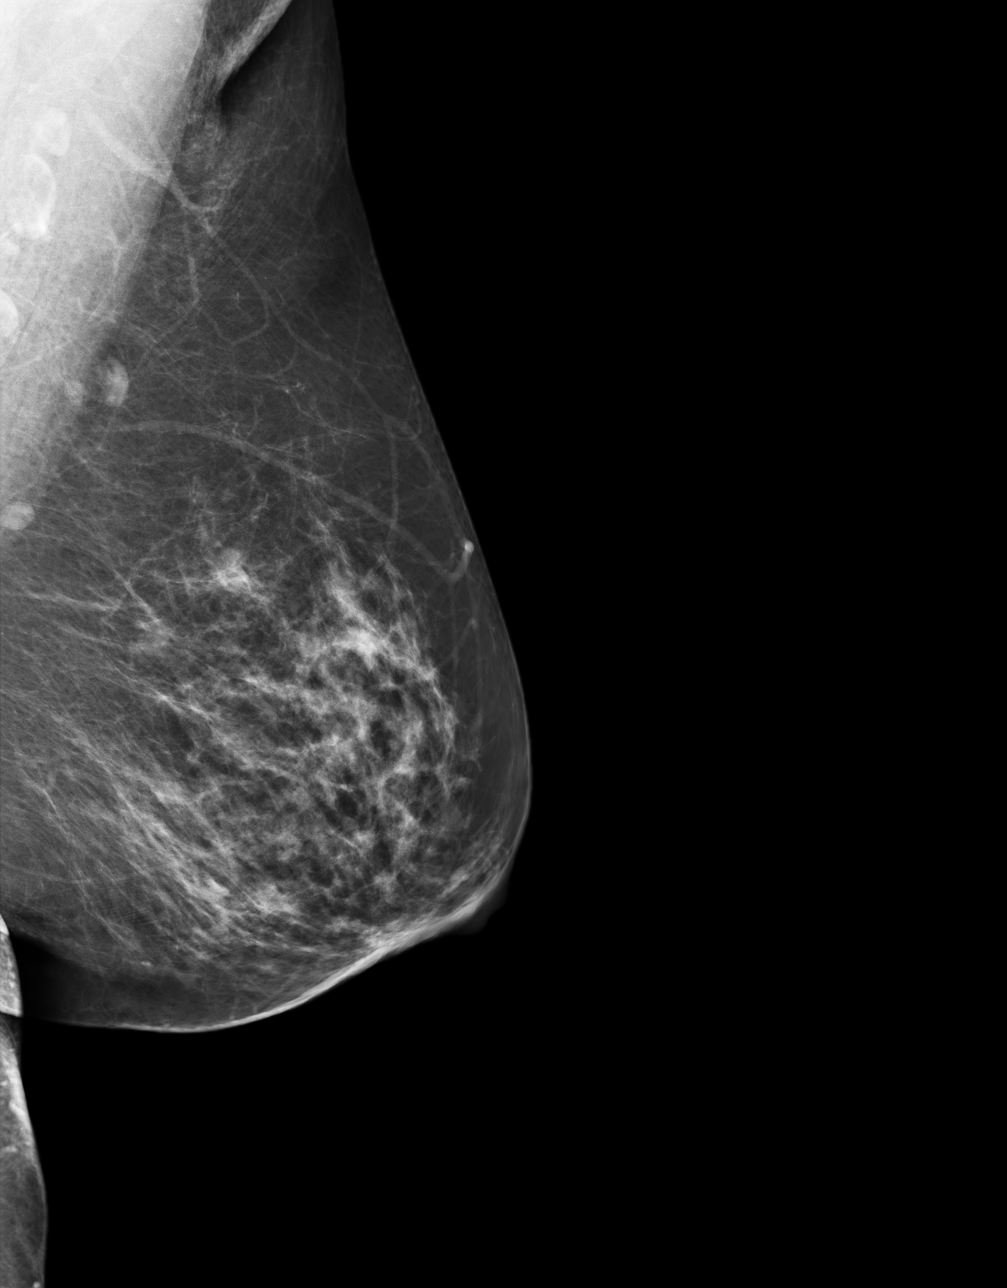

[4 of 4 positions shown; findings below may reference images not displayed]

ACR Breast Density Category c: The breast tissue is heterogeneously
dense, which may obscure small masses.
FINDINGS: There are no findings suspicious for malignancy. Images were
processed with CAD.
IMPRESSION: No mammographic evidence of malignancy. A result letter of this
screening mammogram will be mailed directly to the patient.

RECOMMENDATION:
Screening mammogram in one year. (Code:YJ-2-FEZ)

BI-RADS CATEGORY  1: Negative.

## 2015-09-28 ENCOUNTER — Telehealth: Payer: Self-pay

## 2015-09-28 NOTE — Telephone Encounter (Signed)
PA completed on Cover my meds for Omega-3 Ethyl Esteres 1 Gm

## 2015-09-29 NOTE — Telephone Encounter (Signed)
PA approved from 09/28/2015-09/28/2018. Thanks

## 2015-10-07 ENCOUNTER — Encounter: Payer: Self-pay | Admitting: Internal Medicine

## 2015-10-07 ENCOUNTER — Ambulatory Visit (INDEPENDENT_AMBULATORY_CARE_PROVIDER_SITE_OTHER): Payer: BC Managed Care – PPO | Admitting: Internal Medicine

## 2015-10-07 VITALS — BP 124/70 | HR 72 | Temp 98.5°F | Resp 18 | Ht 61.0 in | Wt 161.8 lb

## 2015-10-07 DIAGNOSIS — D649 Anemia, unspecified: Secondary | ICD-10-CM

## 2015-10-07 DIAGNOSIS — R945 Abnormal results of liver function studies: Secondary | ICD-10-CM

## 2015-10-07 DIAGNOSIS — N184 Chronic kidney disease, stage 4 (severe): Secondary | ICD-10-CM

## 2015-10-07 DIAGNOSIS — E781 Pure hyperglyceridemia: Secondary | ICD-10-CM | POA: Diagnosis not present

## 2015-10-07 DIAGNOSIS — R7989 Other specified abnormal findings of blood chemistry: Secondary | ICD-10-CM

## 2015-10-07 DIAGNOSIS — I10 Essential (primary) hypertension: Secondary | ICD-10-CM | POA: Diagnosis not present

## 2015-10-07 DIAGNOSIS — E669 Obesity, unspecified: Secondary | ICD-10-CM

## 2015-10-07 DIAGNOSIS — E8881 Metabolic syndrome: Secondary | ICD-10-CM

## 2015-10-07 DIAGNOSIS — R739 Hyperglycemia, unspecified: Secondary | ICD-10-CM

## 2015-10-07 NOTE — Assessment & Plan Note (Signed)
Has adjusted diet.  Lost weight.  Recheck liver panel with next labs.

## 2015-10-07 NOTE — Assessment & Plan Note (Signed)
Last hgb wnl.  Ferritin slightly elevated.  Off iron.  Recheck cbc and ferritin.

## 2015-10-07 NOTE — Assessment & Plan Note (Signed)
Followed by nephrology.  Last creatinine 1.7.  Recheck metabolic panel next labs.

## 2015-10-07 NOTE — Assessment & Plan Note (Signed)
Has adjusted her diet.  Decreased carb intake.  Lost weight.  Follow lipid panel.  On crestor.

## 2015-10-07 NOTE — Assessment & Plan Note (Signed)
Diet and exercise.   

## 2015-10-07 NOTE — Progress Notes (Signed)
Pre-visit discussion using our clinic review tool. No additional management support is needed unless otherwise documented below in the visit note.  

## 2015-10-07 NOTE — Assessment & Plan Note (Signed)
Blood pressure under good control.  Continue same medication regimen.  Follow pressures.  Follow metabolic panel.   

## 2015-10-07 NOTE — Progress Notes (Signed)
Patient ID: Angel Collins, female   DOB: 1960/06/06, 55 y.o.   MRN: [id number]   Subjective:    Patient ID: Angel Collins, female    DOB: June 23, 1960, 55 y.o.   MRN: [id number]  HPI  Patient here for scheduled follow up.  She has adjusted her diet.  Lost weight.  Feels better.  Is walking.  No chest pain.  No sob.  No acid reflux.  No abdominal pain or cramping.  Bowels stable.  On victoza now.  Doing well with this.  Overall feels good.  Last kidney function stable/improved.    Past Medical History  Diagnosis Date  . Hypertension   . Hypercholesterolemia   . Chronic kidney disease     medullary cystic kidney disease  . Abnormal liver function     fatty liver  . Gout   . Hyperglycemia    Past Surgical History  Procedure Laterality Date  . Ovarian cyst removed  1979  . Cesarean section  1983  . Tubal ligation  1987   Family History  Problem Relation Age of Onset  . Lung cancer Mother     died age 27  . Liver cancer Mother   . Liver cancer Father   . Hypertension Brother   . Hypercholesterolemia Brother   . Gout Brother   . Breast cancer Neg Hx    Social History   Social History  . Marital Status: Married    Spouse Name: N/A  . Number of Children: 3  . Years of Education: N/A   Social History Main Topics  . Smoking status: Never Smoker   . Smokeless tobacco: Never Used  . Alcohol Use: 0.0 oz/week    0 Standard drinks or equivalent per week     Comment: occasional  . Drug Use: No  . Sexual Activity: Not Asked   Other Topics Concern  . None   Social History Narrative    Outpatient Encounter Prescriptions as of 10/07/2015  Medication Sig  . aspirin 81 MG tablet Take 81 mg by mouth daily.  . metoprolol (LOPRESSOR) 50 MG tablet 1/2 tablet bid  . omega-3 acid ethyl esters (LOVAZA) 1 g capsule TAKE ONE CAPSULE BY MOUTH 4 TIMES A DAY  . PRILOSEC OTC 20 MG tablet TAKE 1 TABLET BY MOUTH EVERY DAY  . rosuvastatin (CRESTOR) 5 MG tablet TAKE 1 TABLET BY MOUTH DAILY    . ULORIC 40 MG tablet TAKE 1 TABLET BY MOUTH EVERY DAY AS DIRECTED  . VICTOZA 18 MG/3ML SOPN INJECT 0.2 ML (1.2 MG TOTAL) UNDER THE SKIN DAILY.  . vitamin E 400 UNIT capsule Take 400 Units by mouth 2 (two) times daily.  . [DISCONTINUED] ferrous sulfate 325 (65 FE) MG tablet Take 325 mg by mouth daily.   No facility-administered encounter medications on file as of 10/07/2015.    Review of Systems  Constitutional: Negative for appetite change and unexpected weight change.  HENT: Negative for congestion and sinus pressure.   Respiratory: Negative for cough, chest tightness and shortness of breath.   Cardiovascular: Negative for chest pain, palpitations and leg swelling.  Gastrointestinal: Negative for nausea, vomiting, abdominal pain and diarrhea.  Genitourinary: Negative for dysuria and difficulty urinating.  Musculoskeletal: Negative for back pain and joint swelling.  Skin: Negative for color change and rash.  Neurological: Negative for dizziness, light-headedness and headaches.  Psychiatric/Behavioral: Negative for dysphoric mood and agitation.       Objective:     Blood pressure rechecked by me:  128/78  Physical Exam  Constitutional: She appears well-developed and well-nourished. No distress.  HENT:  Nose: Nose normal.  Mouth/Throat: Oropharynx is clear and moist.  Neck: Neck supple. No thyromegaly present.  Cardiovascular: Normal rate and regular rhythm.   Pulmonary/Chest: Breath sounds normal. No respiratory distress. She has no wheezes.  Abdominal: Soft. Bowel sounds are normal. There is no tenderness.  Musculoskeletal: She exhibits no edema or tenderness.  Lymphadenopathy:    She has no cervical adenopathy.  Skin: No rash noted. No erythema.    BP 124/70 mmHg  Pulse 72  Temp(Src) 98.5 F (36.9 C) (Oral)  Resp 18  Ht 5\' 1"  (1.549 m)  Wt 161 lb 12 oz (73.369 kg)  BMI 30.58 kg/m2  SpO2 98% Wt Readings from Last 3 Encounters:  10/07/15 161 lb 12 oz (73.369 kg)   06/08/15 177 lb 3.2 oz (80.377 kg)  01/30/15 175 lb 4 oz (79.493 kg)     Lab Results  Component Value Date   WBC 7.2 08/05/2015   HGB 12.5 08/05/2015   HCT 37.2 08/05/2015   PLT 175.0 08/05/2015   GLUCOSE 125* 06/04/2015   CHOL 182 06/04/2015   TRIG 302.0* 06/04/2015   HDL 46.60 06/04/2015   LDLDIRECT 66.0 06/04/2015   LDLCALC 79 08/08/2013   ALT 56* 08/05/2015   AST 36 08/05/2015   NA 140 06/04/2015   K 4.6 06/04/2015   CL 104 06/04/2015   CREATININE 2.34* 06/04/2015   BUN 56* 06/04/2015   CO2 26 06/04/2015   TSH 3.52 01/27/2015   HGBA1C 6.1 06/04/2015    Mm Digital Screening Bilateral  06/09/2015  CLINICAL DATA:  Screening. EXAM: DIGITAL SCREENING BILATERAL MAMMOGRAM WITH CAD COMPARISON:  Previous exam(s). ACR Breast Density Category b: There are scattered areas of fibroglandular density. FINDINGS: There are no findings suspicious for malignancy. Images were processed with CAD. IMPRESSION: No mammographic evidence of malignancy. A result letter of this screening mammogram will be mailed directly to the patient. RECOMMENDATION: Screening mammogram in one year. (Code:SM-B-01Y) BI-RADS CATEGORY  1: Negative. Electronically Signed   By: Margarette Canada M.D.   On: 06/09/2015 11:05       Assessment & Plan:   Problem List Items Addressed This Visit    Abnormal liver function tests    Has adjusted diet.  Lost weight.  Recheck liver panel with next labs.       Relevant Orders   Hepatic function panel   Anemia    Last hgb wnl.  Ferritin slightly elevated.  Off iron.  Recheck cbc and ferritin.       Relevant Orders   CBC with Differential/Platelet   Ferritin   Chronic kidney disease (CKD), stage IV (severe) ([place])    Followed by nephrology.  Last creatinine 1.7.  Recheck metabolic panel next labs.       Hyperglycemia    On victoza now.  Has adjusted her diet.  Lost weight.  Check metabolic panel and [id number].        Relevant Orders   Hemoglobin A1c   Hypertension - Primary     Blood pressure under good control.  Continue same medication regimen.  Follow pressures.  Follow metabolic panel.        Relevant Orders   Basic metabolic panel   Hypertriglyceridemia    Has adjusted her diet.  Decreased carb intake.  Lost weight.  Follow lipid panel.  On crestor.        Relevant Orders   Lipid panel  Metabolic syndrome    Has adjusted diet.  Lost weight.  On victoza.  Follow.       Obesity (BMI 30-39.9)    Diet and exercise.        Relevant Medications   VICTOZA 18 MG/3ML SOPN       Einar Pheasant, MD

## 2015-10-07 NOTE — Assessment & Plan Note (Signed)
On victoza now.  Has adjusted her diet.  Lost weight.  Check metabolic panel and [id number].

## 2015-10-07 NOTE — Assessment & Plan Note (Signed)
Has adjusted diet.  Lost weight.  On victoza.  Follow.

## 2015-10-24 ENCOUNTER — Other Ambulatory Visit: Payer: Self-pay | Admitting: Internal Medicine

## 2015-10-27 ENCOUNTER — Other Ambulatory Visit: Payer: Self-pay | Admitting: Internal Medicine

## 2015-10-29 ENCOUNTER — Other Ambulatory Visit (INDEPENDENT_AMBULATORY_CARE_PROVIDER_SITE_OTHER): Payer: BC Managed Care – PPO

## 2015-10-29 DIAGNOSIS — D649 Anemia, unspecified: Secondary | ICD-10-CM | POA: Diagnosis not present

## 2015-10-29 DIAGNOSIS — R945 Abnormal results of liver function studies: Secondary | ICD-10-CM

## 2015-10-29 DIAGNOSIS — E781 Pure hyperglyceridemia: Secondary | ICD-10-CM

## 2015-10-29 DIAGNOSIS — R7989 Other specified abnormal findings of blood chemistry: Secondary | ICD-10-CM

## 2015-10-29 DIAGNOSIS — R739 Hyperglycemia, unspecified: Secondary | ICD-10-CM

## 2015-10-29 DIAGNOSIS — I10 Essential (primary) hypertension: Secondary | ICD-10-CM | POA: Diagnosis not present

## 2015-10-29 LAB — BASIC METABOLIC PANEL
BUN: 49 mg/dL — ABNORMAL HIGH (ref 6–23)
CHLORIDE: 104 meq/L (ref 96–112)
CO2: 23 mEq/L (ref 19–32)
Calcium: 9.8 mg/dL (ref 8.4–10.5)
Creatinine, Ser: 2.39 mg/dL — ABNORMAL HIGH (ref 0.40–1.20)
GFR: 22.24 mL/min — ABNORMAL LOW (ref >60.00)
GLUCOSE: 104 mg/dL — AB (ref 70–99)
POTASSIUM: 4.1 meq/L (ref 3.5–5.1)
Sodium: 139 mEq/L (ref 135–145)

## 2015-10-29 LAB — HEPATIC FUNCTION PANEL
ALK PHOS: 84 U/L (ref 39–117)
ALT: 30 U/L (ref 0–35)
AST: 23 U/L (ref 0–37)
Albumin: 4.5 g/dL (ref 3.5–5.2)
BILIRUBIN TOTAL: 0.6 mg/dL (ref 0.2–1.2)
Bilirubin, Direct: 0.1 mg/dL (ref 0.0–0.3)
Total Protein: 7.7 g/dL (ref 6.0–8.3)

## 2015-10-29 LAB — LIPID PANEL
CHOL/HDL RATIO: 3
Cholesterol: 149 mg/dL (ref 0–200)
HDL: 44.6 mg/dL (ref >39.00)
NONHDL: 104.46
Triglycerides: 252 mg/dL — ABNORMAL HIGH (ref 0.0–149.0)
VLDL: 50.4 mg/dL — AB (ref 0.0–40.0)

## 2015-10-29 LAB — CBC WITH DIFFERENTIAL/PLATELET
BASOS ABS: 0 10*3/uL (ref 0.0–0.1)
Basophils Relative: 0.2 % (ref 0.0–3.0)
EOS ABS: 0.2 10*3/uL (ref 0.0–0.7)
Eosinophils Relative: 2.1 % (ref 0.0–5.0)
HEMATOCRIT: 37.9 % (ref 36.0–46.0)
HEMOGLOBIN: 12.6 g/dL (ref 12.0–15.0)
LYMPHS PCT: 26 % (ref 12.0–46.0)
Lymphs Abs: 2.7 10*3/uL (ref 0.7–4.0)
MCHC: 33.2 g/dL (ref 30.0–36.0)
MCV: 94.1 fl (ref 78.0–100.0)
Monocytes Absolute: 0.9 10*3/uL (ref 0.1–1.0)
Monocytes Relative: 8.6 % (ref 3.0–12.0)
Neutro Abs: 6.5 10*3/uL (ref 1.4–7.7)
Neutrophils Relative %: 63.1 % (ref 43.0–77.0)
PLATELETS: 198 10*3/uL (ref 150.0–400.0)
RBC: 4.03 Mil/uL (ref 3.87–5.11)
RDW: 13.3 % (ref 11.5–15.5)
WBC: 10.3 10*3/uL (ref 4.0–10.5)

## 2015-10-29 LAB — HEMOGLOBIN A1C: HEMOGLOBIN A1C: 5.7 % (ref 4.6–6.5)

## 2015-10-29 LAB — FERRITIN: Ferritin: 393 ng/mL — ABNORMAL HIGH (ref 10.0–291.0)

## 2015-10-29 LAB — LDL CHOLESTEROL, DIRECT: Direct LDL: 66 mg/dL

## 2015-11-01 ENCOUNTER — Encounter: Payer: Self-pay | Admitting: Internal Medicine

## 2016-02-09 ENCOUNTER — Ambulatory Visit: Payer: BC Managed Care – PPO | Admitting: Internal Medicine

## 2016-02-11 ENCOUNTER — Ambulatory Visit (INDEPENDENT_AMBULATORY_CARE_PROVIDER_SITE_OTHER): Payer: BC Managed Care – PPO | Admitting: Internal Medicine

## 2016-02-11 ENCOUNTER — Encounter: Payer: Self-pay | Admitting: Internal Medicine

## 2016-02-11 DIAGNOSIS — R739 Hyperglycemia, unspecified: Secondary | ICD-10-CM | POA: Diagnosis not present

## 2016-02-11 DIAGNOSIS — E781 Pure hyperglyceridemia: Secondary | ICD-10-CM

## 2016-02-11 DIAGNOSIS — R945 Abnormal results of liver function studies: Secondary | ICD-10-CM

## 2016-02-11 DIAGNOSIS — I10 Essential (primary) hypertension: Secondary | ICD-10-CM

## 2016-02-11 DIAGNOSIS — R7989 Other specified abnormal findings of blood chemistry: Secondary | ICD-10-CM

## 2016-02-11 DIAGNOSIS — E8881 Metabolic syndrome: Secondary | ICD-10-CM

## 2016-02-11 DIAGNOSIS — N184 Chronic kidney disease, stage 4 (severe): Secondary | ICD-10-CM

## 2016-02-11 NOTE — Progress Notes (Signed)
Pre visit review using our clinic review tool, if applicable. No additional management support is needed unless otherwise documented below in the visit note. 

## 2016-02-11 NOTE — Progress Notes (Signed)
Patient ID: Angel Collins, female   DOB: [birth year], 55 y.o.   MRN: [id number]   Subjective:    Patient ID: Angel Collins, female    DOB: 09/21/1960, 55 y.o.   MRN: [id number]  HPI  Patient here for a scheduled follow up.  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  Renal function recently improved.  On transplant list since 09/2012.  Inactive secondary to improved kidney function.  Has lost weight.  Taking victoza.  Some nausea occasionally.  Nothing significant.  She feels she is doing well.  Has adjusted her diet.  Lost weight.  No chest pain.  No sob.  No acid reflux.  No abdominal pain or cramping.  Bowels stable.     Past Medical History:  Diagnosis Date  . Abnormal liver function    fatty liver  . 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 Gout   . Hypercholesterolemia   . Hyperglycemia   . Hypertension    Past Surgical History:  Procedure Laterality Date  . CESAREAN SECTION  1983  . ovarian cyst removed  1979  . TUBAL LIGATION  1987   Family History  Problem Relation Age of Onset  . Lung cancer Mother     died age 34  . Liver cancer Mother   . Liver cancer Father   . Hypertension Brother   . Hypercholesterolemia Brother   . Gout Brother   . Breast cancer Neg Hx    Social History   Social History  . Marital status: Married    Spouse name: N/A  . Number of children: 3  . Years of education: N/A   Social History Main Topics  . Smoking status: Never Smoker  . Smokeless tobacco: Never Used  . Alcohol use 0.0 oz/week     Comment: occasional  . Drug use: No  . Sexual activity: Not Asked   Other Topics Concern  . None   Social History Narrative  . None    Outpatient Encounter Prescriptions as of 02/11/2016  Medication Sig  . aspirin 81 MG tablet Take 81 mg by mouth daily.  . metoprolol (LOPRESSOR) 50 MG tablet 1/2 tablet bid  . omega-3 acid ethyl esters (LOVAZA) 1 g capsule TAKE ONE CAPSULE BY MOUTH 4 TIMES A DAY  . PRILOSEC  OTC 20 MG tablet TAKE 1 TABLET BY MOUTH EVERY DAY  . rosuvastatin (CRESTOR) 5 MG tablet TAKE 1 TABLET BY MOUTH DAILY  . ULORIC 40 MG tablet TAKE 1 TABLET BY MOUTH EVERY DAY AS DIRECTED  . VICTOZA 18 MG/3ML SOPN INJECT 0.2 ML (1.2 MG TOTAL) UNDER THE SKIN DAILY.  . vitamin E 400 UNIT capsule Take 400 Units by mouth 2 (two) times daily.   No facility-administered encounter medications on file as of 02/11/2016.     Review of Systems  Constitutional: Negative for appetite change and unexpected weight change.  HENT: Negative for congestion and sinus pressure.   Respiratory: Negative for cough, chest tightness and shortness of breath.   Cardiovascular: Negative for chest pain, palpitations and leg swelling.  Gastrointestinal: Negative for abdominal pain, diarrhea, nausea and vomiting.  Genitourinary: Negative for difficulty urinating and dysuria.  Musculoskeletal: Negative for back pain and joint swelling.  Skin: Negative for color change and rash.  Neurological: Negative for dizziness, light-headedness and headaches.  Psychiatric/Behavioral: Negative for agitation and dysphoric mood.       Objective:    Physical Exam  Constitutional: She appears  well-developed and well-nourished. No distress.  HENT:  Nose: Nose normal.  Mouth/Throat: Oropharynx is clear and moist.  Neck: Neck supple. No thyromegaly present.  Cardiovascular: Normal rate and regular rhythm.   Pulmonary/Chest: Breath sounds normal. No respiratory distress. She has no wheezes.  Abdominal: Soft. Bowel sounds are normal. There is no tenderness.  Musculoskeletal: She exhibits no edema or tenderness.  Lymphadenopathy:    She has no cervical adenopathy.  Skin: No rash noted. No erythema.  Psychiatric: She has a normal mood and affect. Her behavior is normal.    BP 130/80   Pulse 72   Temp 98.6 F (37 C) (Oral)   Ht '5\' 1"'  (1.549 m)   Wt 157 lb 9.6 oz (71.5 kg)   SpO2 98%   BMI 29.78 kg/m  Wt Readings from Last 3  Encounters:  02/11/16 157 lb 9.6 oz (71.5 kg)  10/07/15 161 lb 12 oz (73.4 kg)  06/08/15 177 lb 3.2 oz (80.4 kg)     Lab Results  Component Value Date   WBC 10.3 10/29/2015   HGB 12.6 10/29/2015   HCT 37.9 10/29/2015   PLT 198.0 10/29/2015   GLUCOSE 104 (H) 10/29/2015   CHOL 149 10/29/2015   TRIG 252.0 (H) 10/29/2015   HDL 44.60 10/29/2015   LDLDIRECT 66.0 10/29/2015   LDLCALC 79 08/08/2013   ALT 30 10/29/2015   AST 23 10/29/2015   NA 139 10/29/2015   K 4.1 10/29/2015   CL 104 10/29/2015   CREATININE 2.39 (H) 10/29/2015   BUN 49 (H) 10/29/2015   CO2 23 10/29/2015   TSH 3.52 01/27/2015   HGBA1C 5.7 10/29/2015    Mm Digital Screening Bilateral  Result Date: 06/09/2015 CLINICAL DATA:  Screening. EXAM: DIGITAL SCREENING BILATERAL MAMMOGRAM WITH CAD COMPARISON:  Previous exam(s). ACR Breast Density Category b: There are scattered areas of fibroglandular density. FINDINGS: There are no findings suspicious for malignancy. Images were processed with CAD. IMPRESSION: No mammographic evidence of malignancy. A result letter of this screening mammogram will be mailed directly to the patient. RECOMMENDATION: Screening mammogram in one year. (Code:SM-B-01Y) BI-RADS CATEGORY  1: Negative. Electronically Signed   By: Margarette Canada M.D.   On: 06/09/2015 11:05       Assessment & Plan:   Problem List Items Addressed This Visit    Abnormal liver function tests    Last check wnl.  Follow.  Continue diet, exercise and weight loss.        Chronic kidney disease (CKD), stage IV (severe) ([place])    Followed by nephrology.   Stable.  Continue f/u with nephrology.        Hyperglycemia    Low carb diet and exercise.  Follow met b and a1c.       Hypertension    Blood pressure under good control.  Continue same medication regimen.  Follow pressures.  Follow metabolic panel.        Hypertriglyceridemia    Low carb diet and exercise.  Follow lipid panel.  On crestor.  Also taking lovaza.  Has  adjusted her diet.  Lost weight.  Follow.   Lab Results  Component Value Date   CHOL 149 10/29/2015   HDL 44.60 10/29/2015   LDLCALC 79 08/08/2013   LDLDIRECT 66.0 10/29/2015   TRIG 252.0 (H) 10/29/2015   CHOLHDL 3 22/29/7989        Metabolic syndrome    Has adjusted her diet.  Lost weight.  On victoza.  Doing well.  Feels  better.  Follow.            Einar Pheasant, MD

## 2016-02-14 ENCOUNTER — Encounter: Payer: Self-pay | Admitting: Internal Medicine

## 2016-02-14 NOTE — Assessment & Plan Note (Signed)
Has adjusted her diet.  Lost weight.  On victoza.  Doing well.  Feels better.  Follow.

## 2016-02-14 NOTE — Assessment & Plan Note (Signed)
Low carb diet and exercise.  Follow lipid panel.  On crestor.  Also taking lovaza.  Has adjusted her diet.  Lost weight.  Follow.   Lab Results  Component Value Date   CHOL 149 10/29/2015   HDL 44.60 10/29/2015   LDLCALC 79 08/08/2013   LDLDIRECT 66.0 10/29/2015   TRIG 252.0 (H) 10/29/2015   CHOLHDL 3 10/29/2015

## 2016-02-14 NOTE — Assessment & Plan Note (Signed)
Low carb diet and exercise.  Follow met b and a1c.  

## 2016-02-14 NOTE — Assessment & Plan Note (Signed)
Last check wnl.  Follow.  Continue diet, exercise and weight loss.

## 2016-02-14 NOTE — Assessment & Plan Note (Signed)
Followed by nephrology.   Stable.  Continue f/u with nephrology.

## 2016-02-14 NOTE — Assessment & Plan Note (Signed)
Blood pressure under good control.  Continue same medication regimen.  Follow pressures.  Follow metabolic panel.   

## 2016-03-25 ENCOUNTER — Other Ambulatory Visit: Payer: Self-pay | Admitting: Internal Medicine

## 2016-04-17 ENCOUNTER — Other Ambulatory Visit: Payer: Self-pay | Admitting: Internal Medicine

## 2016-04-24 ENCOUNTER — Other Ambulatory Visit: Payer: Self-pay | Admitting: Internal Medicine

## 2016-04-25 NOTE — Telephone Encounter (Signed)
Please advise on refill. Last OV 02/2016. Follow up 06/10/2016

## 2016-04-26 NOTE — Telephone Encounter (Signed)
LM for patient to return call.

## 2016-04-26 NOTE — Telephone Encounter (Signed)
Please confirm with pt that she is taking 50mg  - one tablet bid.  If so, ok to refill.

## 2016-05-02 NOTE — Telephone Encounter (Signed)
Pt called back in regards to a vm that [place] had left.  Call pt @ [phone]

## 2016-05-05 NOTE — Telephone Encounter (Signed)
Left message to call office

## 2016-05-10 NOTE — Telephone Encounter (Signed)
Pt called back returning you call. Thank you!  Call pt @ [phone]

## 2016-06-03 NOTE — Telephone Encounter (Signed)
Left message to call.

## 2016-06-10 ENCOUNTER — Encounter: Payer: Self-pay | Admitting: Internal Medicine

## 2016-06-10 ENCOUNTER — Ambulatory Visit (INDEPENDENT_AMBULATORY_CARE_PROVIDER_SITE_OTHER): Payer: BC Managed Care – PPO | Admitting: Internal Medicine

## 2016-06-10 ENCOUNTER — Other Ambulatory Visit (HOSPITAL_COMMUNITY)
Admission: RE | Admit: 2016-06-10 | Discharge: 2016-06-10 | Disposition: A | Discharge status: Home / Self Care | Payer: BC Managed Care – PPO | Source: Ambulatory Visit | Attending: Internal Medicine | Admitting: Internal Medicine

## 2016-06-10 VITALS — BP 118/76 | HR 83 | Temp 98.6°F | Resp 16 | Ht 61.0 in | Wt 152.4 lb

## 2016-06-10 DIAGNOSIS — R739 Hyperglycemia, unspecified: Secondary | ICD-10-CM

## 2016-06-10 DIAGNOSIS — Z01411 Encounter for gynecological examination (general) (routine) with abnormal findings: Secondary | ICD-10-CM | POA: Insufficient documentation

## 2016-06-10 DIAGNOSIS — R945 Abnormal results of liver function studies: Secondary | ICD-10-CM

## 2016-06-10 DIAGNOSIS — Z124 Encounter for screening for malignant neoplasm of cervix: Secondary | ICD-10-CM

## 2016-06-10 DIAGNOSIS — Z0000 Encounter for general adult medical examination without abnormal findings: Secondary | ICD-10-CM | POA: Diagnosis not present

## 2016-06-10 DIAGNOSIS — E781 Pure hyperglyceridemia: Secondary | ICD-10-CM

## 2016-06-10 DIAGNOSIS — E8881 Metabolic syndrome: Secondary | ICD-10-CM

## 2016-06-10 DIAGNOSIS — Z1151 Encounter for screening for human papillomavirus (HPV): Secondary | ICD-10-CM | POA: Insufficient documentation

## 2016-06-10 DIAGNOSIS — I10 Essential (primary) hypertension: Secondary | ICD-10-CM | POA: Diagnosis not present

## 2016-06-10 DIAGNOSIS — Z1231 Encounter for screening mammogram for malignant neoplasm of breast: Secondary | ICD-10-CM | POA: Diagnosis not present

## 2016-06-10 DIAGNOSIS — R42 Dizziness and giddiness: Secondary | ICD-10-CM

## 2016-06-10 DIAGNOSIS — R7989 Other specified abnormal findings of blood chemistry: Secondary | ICD-10-CM

## 2016-06-10 DIAGNOSIS — N184 Chronic kidney disease, stage 4 (severe): Secondary | ICD-10-CM

## 2016-06-10 DIAGNOSIS — Z1239 Encounter for other screening for malignant neoplasm of breast: Secondary | ICD-10-CM

## 2016-06-10 MED ORDER — OMEPRAZOLE MAGNESIUM 20 MG PO TBEC: 20.0000 mg | DELAYED_RELEASE_TABLET | Freq: Every day | ORAL | 5 refills | Status: DC

## 2016-06-10 MED ORDER — METOPROLOL TARTRATE 50 MG PO TABS: ORAL_TABLET | ORAL | 3 refills | Status: DC

## 2016-06-10 MED ORDER — FEBUXOSTAT 40 MG PO TABS: 40.0000 mg | ORAL_TABLET | Freq: Every day | ORAL | 5 refills | Status: DC

## 2016-06-10 MED ORDER — ROSUVASTATIN CALCIUM 5 MG PO TABS: 5.0000 mg | ORAL_TABLET | Freq: Every day | ORAL | 5 refills | Status: DC

## 2016-06-10 NOTE — Progress Notes (Signed)
Pre-visit discussion using our clinic review tool. No additional management support is needed unless otherwise documented below in the visit note.  

## 2016-06-10 NOTE — Progress Notes (Signed)
Patient ID: EMMA-LEE ODDO, female   DOB: September 30, 1960, 56 y.o.   MRN: [id number]   Subjective:    Patient ID: [name], female    DOB: 07-01-1960, 56 y.o.   MRN: [id number]  HPI  Patient here for her physical exam.  States she is doing relatively well.  Has adjusted her diet.  Lost weight.  Feels better.  Did report that two weeks ago, she had an episode of dizziness.  Was under increased stress.  She was finishing eating.  Noticed room spinning.  Lasted 1-2 minutes.  No headache.  No residual problems.  No further dizziness.  No chest pain.  No sob.  No acid reflux.  No abdominal pain or cramping. Discussed further w/up and evaluation.  She declines.  Wants to monitor.  Stress is better.     Past Medical History:  Diagnosis Date  . Abnormal liver function    fatty liver  . 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 Gout   . Hypercholesterolemia   . Hyperglycemia   . Hypertension    Past Surgical History:  Procedure Laterality Date  . CESAREAN SECTION  1983  . ovarian cyst removed  1979  . TUBAL LIGATION  1987   Family History  Problem Relation Age of Onset  . Lung cancer Mother     died age 49  . Liver cancer Mother   . Liver cancer Father   . Hypertension Brother   . Hypercholesterolemia Brother   . Gout Brother   . Breast cancer Neg Hx    Social History   Social History  . Marital status: Married    Spouse name: N/A  . Number of children: 3  . Years of education: N/A   Social History Main Topics  . Smoking status: Never Smoker  . Smokeless tobacco: Never Used  . Alcohol use 0.0 oz/week     Comment: occasional  . Drug use: No  . Sexual activity: Not Asked   Other Topics Concern  . None   Social History Narrative  . None    Outpatient Encounter Prescriptions as of 06/10/2016  Medication Sig  . aspirin 81 MG tablet Take 81 mg by mouth daily.  . febuxostat (ULORIC) 40 MG tablet Take 1 tablet (40 mg total) by mouth daily.  . metoprolol  (LOPRESSOR) 50 MG tablet 1/2 tablet bid  . omega-3 acid ethyl esters (LOVAZA) 1 g capsule TAKE ONE CAPSULE BY MOUTH 4 TIMES A DAY  . omeprazole (PRILOSEC OTC) 20 MG tablet Take 1 tablet (20 mg total) by mouth daily.  . rosuvastatin (CRESTOR) 5 MG tablet Take 1 tablet (5 mg total) by mouth daily.  Marland Kitchen VICTOZA 18 MG/3ML SOPN INJECT 0.2 ML (1.2 MG TOTAL) UNDER THE SKIN DAILY.  . vitamin E 400 UNIT capsule Take 400 Units by mouth 2 (two) times daily.  . [DISCONTINUED] metoprolol (LOPRESSOR) 50 MG tablet 1/2 tablet bid  . [DISCONTINUED] PRILOSEC OTC 20 MG tablet TAKE 1 TABLET BY MOUTH EVERY DAY  . [DISCONTINUED] rosuvastatin (CRESTOR) 5 MG tablet TAKE 1 TABLET BY MOUTH DAILY  . [DISCONTINUED] ULORIC 40 MG tablet TAKE 1 TABLET BY MOUTH EVERY DAY AS DIRECTED   No facility-administered encounter medications on file as of 06/10/2016.     Review of Systems  Constitutional: Negative for appetite change and unexpected weight change.  HENT: Negative for congestion and sinus pressure.   Eyes: Negative for pain and visual disturbance.  Respiratory: Negative  for cough, chest tightness and shortness of breath.   Cardiovascular: Negative for chest pain, palpitations and leg swelling.  Gastrointestinal: Negative for abdominal pain, diarrhea, nausea and vomiting.  Genitourinary: Negative for difficulty urinating and dysuria.  Musculoskeletal: Negative for back pain and joint swelling.  Skin: Negative for color change and rash.  Neurological: Negative for headaches.       Previous dizziness.    Hematological: Negative for adenopathy. Does not bruise/bleed easily.  Psychiatric/Behavioral: Negative for agitation and dysphoric mood.       Objective:     Blood pressure rechecked by me:  120/78  Physical Exam  Constitutional: She is oriented to person, place, and time. She appears well-developed and well-nourished. No distress.  HENT:  Nose: Nose normal.  Mouth/Throat: Oropharynx is clear and moist.  Eyes:  Right eye exhibits no discharge. Left eye exhibits no discharge. No scleral icterus.  Neck: Neck supple. No thyromegaly present.  Cardiovascular: Normal rate and regular rhythm.   Pulmonary/Chest: Breath sounds normal. No accessory muscle usage. No tachypnea. No respiratory distress. She has no decreased breath sounds. She has no wheezes. She has no rhonchi. Right breast exhibits no inverted nipple, no mass, no nipple discharge and no tenderness (no axillary adenopathy). Left breast exhibits no inverted nipple, no mass, no nipple discharge and no tenderness (no axilarry adenopathy).  Abdominal: Soft. Bowel sounds are normal. There is no tenderness.  Genitourinary:  Genitourinary Comments: Normal external genitalia.  Vaginal vault without lesions.  Cervix identified.  Atrophy changes present.  Pap smear performed.  Could not appreciate any adnexal masses or tenderness.    Musculoskeletal: She exhibits no edema or tenderness.  Lymphadenopathy:    She has no cervical adenopathy.  Neurological: She is alert and oriented to person, place, and time.  Skin: Skin is warm. No rash noted. No erythema.  Psychiatric: She has a normal mood and affect. Her behavior is normal.    BP 118/76 (BP Location: Left Arm, Patient Position: Sitting, Cuff Size: Large)   Pulse 83   Temp 98.6 F (37 C) (Oral)   Resp 16   Ht '5\' 1"'  (1.549 m)   Wt 152 lb 6.4 oz (69.1 kg)   SpO2 97%   BMI 28.80 kg/m  Wt Readings from Last 3 Encounters:  06/10/16 152 lb 6.4 oz (69.1 kg)  02/11/16 157 lb 9.6 oz (71.5 kg)  10/07/15 161 lb 12 oz (73.4 kg)     Lab Results  Component Value Date   WBC 10.3 10/29/2015   HGB 12.6 10/29/2015   HCT 37.9 10/29/2015   PLT 198.0 10/29/2015   GLUCOSE 104 (H) 10/29/2015   CHOL 149 10/29/2015   TRIG 252.0 (H) 10/29/2015   HDL 44.60 10/29/2015   LDLDIRECT 66.0 10/29/2015   LDLCALC 79 08/08/2013   ALT 30 10/29/2015   AST 23 10/29/2015   NA 139 10/29/2015   K 4.1 10/29/2015   CL 104  10/29/2015   CREATININE 2.39 (H) 10/29/2015   BUN 49 (H) 10/29/2015   CO2 23 10/29/2015   TSH 3.52 01/27/2015   HGBA1C 5.7 10/29/2015    Mm Digital Screening Bilateral  Result Date: 06/09/2015 CLINICAL DATA:  Screening. EXAM: DIGITAL SCREENING BILATERAL MAMMOGRAM WITH CAD COMPARISON:  Previous exam(s). ACR Breast Density Category b: There are scattered areas of fibroglandular density. FINDINGS: There are no findings suspicious for malignancy. Images were processed with CAD. IMPRESSION: No mammographic evidence of malignancy. A result letter of this screening mammogram will be mailed directly to  the patient. RECOMMENDATION: Screening mammogram in one year. (Code:SM-B-01Y) BI-RADS CATEGORY  1: Negative. Electronically Signed   By: Margarette Canada M.D.   On: 06/09/2015 11:05       Assessment & Plan:   Problem List Items Addressed This Visit    Abnormal liver function tests    Just had liver biopsy.  Followed by GI.       Relevant Orders   Ferritin   Chronic kidney disease (CKD), stage IV (severe) ([place])    Followed by nephrology.  Stable.        Health care maintenance    Physical today 06/10/16.  PAP 06/10/16.  Mammogram 06/09/15 - birads I.  Scheduled f/u mammogram.  Colonoscopy 09/2008.  Recommended f/u colonoscopy in 10 years.        Hyperglycemia    Low carb diet and exercise.  Follow met b and a1c.        Relevant Orders   Hemoglobin A1c   Hypertension    Blood pressure under good control.  Continue same medication regimen.  Follow pressures.  Follow metabolic panel.        Relevant Medications   metoprolol (LOPRESSOR) 50 MG tablet   rosuvastatin (CRESTOR) 5 MG tablet   Other Relevant Orders   TSH   Basic metabolic panel   Hypertriglyceridemia    Low carb diet and exercise.  On crestor.  Follow lipid panel and liver function tests.        Relevant Medications   metoprolol (LOPRESSOR) 50 MG tablet   rosuvastatin (CRESTOR) 5 MG tablet   Other Relevant Orders   Hepatic  function panel   Lipid panel   Metabolic syndrome    On victoza.  Has adjusted her diet.  Lost weight.  Follow.         Other Visit Diagnoses    Routine general medical examination at a health care facility    -  Primary   Breast cancer screening       Relevant Orders   MM DIGITAL SCREENING BILATERAL   Cervical cancer screening       Relevant Orders   Cytology - PAP   Dizziness       Declines further w/up or evaluation.  wants to follow.         Einar Pheasant, MD

## 2016-06-12 ENCOUNTER — Encounter: Payer: Self-pay | Admitting: Internal Medicine

## 2016-06-12 NOTE — Assessment & Plan Note (Signed)
On victoza.  Has adjusted her diet.  Lost weight.  Follow.

## 2016-06-12 NOTE — Assessment & Plan Note (Signed)
Low carb diet and exercise.  On crestor.  Follow lipid panel and liver function tests.

## 2016-06-12 NOTE — Assessment & Plan Note (Signed)
Low carb diet and exercise.  Follow met b and a1c.   

## 2016-06-12 NOTE — Assessment & Plan Note (Signed)
Just had liver biopsy.  Followed by GI.

## 2016-06-12 NOTE — Assessment & Plan Note (Signed)
Followed by nephrology.  Stable.   

## 2016-06-12 NOTE — Assessment & Plan Note (Signed)
Physical today 06/10/16.  PAP 06/10/16.  Mammogram 06/09/15 - birads I.  Scheduled f/u mammogram.  Colonoscopy 09/2008.  Recommended f/u colonoscopy in 10 years.

## 2016-06-12 NOTE — Assessment & Plan Note (Signed)
Blood pressure under good control.  Continue same medication regimen.  Follow pressures.  Follow metabolic panel.   

## 2016-06-15 ENCOUNTER — Encounter: Payer: Self-pay | Admitting: Internal Medicine

## 2016-06-15 LAB — CYTOLOGY - PAP: HPV: NOT DETECTED

## 2016-06-24 ENCOUNTER — Other Ambulatory Visit (INDEPENDENT_AMBULATORY_CARE_PROVIDER_SITE_OTHER): Payer: BC Managed Care – PPO

## 2016-06-24 DIAGNOSIS — R739 Hyperglycemia, unspecified: Secondary | ICD-10-CM | POA: Diagnosis not present

## 2016-06-24 DIAGNOSIS — I10 Essential (primary) hypertension: Secondary | ICD-10-CM | POA: Diagnosis not present

## 2016-06-24 DIAGNOSIS — R945 Abnormal results of liver function studies: Secondary | ICD-10-CM

## 2016-06-24 DIAGNOSIS — R7989 Other specified abnormal findings of blood chemistry: Secondary | ICD-10-CM | POA: Diagnosis not present

## 2016-06-24 DIAGNOSIS — E781 Pure hyperglyceridemia: Secondary | ICD-10-CM | POA: Diagnosis not present

## 2016-06-24 LAB — LIPID PANEL
Cholesterol: 148 mg/dL (ref 0–200)
HDL: 52.7 mg/dL (ref >39.00)
LDL Cholesterol: 61 mg/dL (ref 0–99)
NONHDL: 95.56
Total CHOL/HDL Ratio: 3
Triglycerides: 171 mg/dL — ABNORMAL HIGH (ref 0.0–149.0)
VLDL: 34.2 mg/dL (ref 0.0–40.0)

## 2016-06-24 LAB — BASIC METABOLIC PANEL
BUN: 34 mg/dL — ABNORMAL HIGH (ref 6–23)
CO2: 28 meq/L (ref 19–32)
CREATININE: 2.14 mg/dL — AB (ref 0.40–1.20)
Calcium: 10 mg/dL (ref 8.4–10.5)
Chloride: 103 mEq/L (ref 96–112)
GFR: 25.21 mL/min — ABNORMAL LOW (ref >60.00)
GLUCOSE: 98 mg/dL (ref 70–99)
Potassium: 4.3 mEq/L (ref 3.5–5.1)
SODIUM: 137 meq/L (ref 135–145)

## 2016-06-24 LAB — HEPATIC FUNCTION PANEL
ALBUMIN: 4.5 g/dL (ref 3.5–5.2)
ALK PHOS: 92 U/L (ref 39–117)
ALT: 28 U/L (ref 0–35)
AST: 25 U/L (ref 0–37)
Bilirubin, Direct: 0.1 mg/dL (ref 0.0–0.3)
TOTAL PROTEIN: 7.2 g/dL (ref 6.0–8.3)
Total Bilirubin: 0.6 mg/dL (ref 0.2–1.2)

## 2016-06-24 LAB — FERRITIN: Ferritin: 247.2 ng/mL (ref 10.0–291.0)

## 2016-06-24 LAB — TSH: TSH: 2.73 u[IU]/mL (ref 0.35–4.50)

## 2016-06-24 LAB — HEMOGLOBIN A1C: Hgb A1c MFr Bld: 5.7 % (ref 4.6–6.5)

## 2016-06-27 ENCOUNTER — Encounter: Payer: Self-pay | Admitting: Internal Medicine

## 2016-07-08 ENCOUNTER — Ambulatory Visit
Admission: RE | Admit: 2016-07-08 | Discharge: 2016-07-08 | Disposition: A | Discharge status: Home / Self Care | Payer: BC Managed Care – PPO | Source: Ambulatory Visit | Attending: Internal Medicine | Admitting: Internal Medicine

## 2016-07-08 DIAGNOSIS — Z1239 Encounter for other screening for malignant neoplasm of breast: Secondary | ICD-10-CM

## 2016-07-08 DIAGNOSIS — Z1231 Encounter for screening mammogram for malignant neoplasm of breast: Secondary | ICD-10-CM | POA: Diagnosis present

## 2016-10-06 ENCOUNTER — Other Ambulatory Visit: Payer: Self-pay | Admitting: Internal Medicine

## 2016-12-13 ENCOUNTER — Encounter: Payer: Self-pay | Admitting: Internal Medicine

## 2016-12-13 ENCOUNTER — Ambulatory Visit (INDEPENDENT_AMBULATORY_CARE_PROVIDER_SITE_OTHER): Payer: BC Managed Care – PPO | Admitting: Internal Medicine

## 2016-12-13 DIAGNOSIS — R945 Abnormal results of liver function studies: Secondary | ICD-10-CM

## 2016-12-13 DIAGNOSIS — E8881 Metabolic syndrome: Secondary | ICD-10-CM

## 2016-12-13 DIAGNOSIS — N184 Chronic kidney disease, stage 4 (severe): Secondary | ICD-10-CM | POA: Diagnosis not present

## 2016-12-13 DIAGNOSIS — E781 Pure hyperglyceridemia: Secondary | ICD-10-CM

## 2016-12-13 DIAGNOSIS — R7989 Other specified abnormal findings of blood chemistry: Secondary | ICD-10-CM

## 2016-12-13 DIAGNOSIS — Z23 Encounter for immunization: Secondary | ICD-10-CM

## 2016-12-13 DIAGNOSIS — R739 Hyperglycemia, unspecified: Secondary | ICD-10-CM

## 2016-12-13 DIAGNOSIS — I10 Essential (primary) hypertension: Secondary | ICD-10-CM

## 2016-12-13 NOTE — Progress Notes (Signed)
Patient ID: Angel Collins, female   DOB: 04/13/1960, 56 y.o.   MRN: [id number]   Subjective:    Patient ID: Angel Collins, female    DOB: 07-11-1960, 56 y.o.   MRN: [id number]  HPI  Patient here for a scheduled follow up.  She reports she is doing well.  Still watching her diet.  Is exercising.  No chest pain.  No sob. No acid reflux.  No abdominal pain.  Bowels moving.  No urine change.  Off transplant list now.  Followed by nephrology.  Per note, last kidney function stable/improved.     Past Medical History:  Diagnosis Date  . Abnormal liver function    fatty liver  . 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 Gout   . Hypercholesterolemia   . Hyperglycemia   . Hypertension    Past Surgical History:  Procedure Laterality Date  . CESAREAN SECTION  1983  . ovarian cyst removed  1979  . TUBAL LIGATION  1987   Family History  Problem Relation Age of Onset  . Lung cancer Mother        died age 79  . Liver cancer Mother   . Liver cancer Father   . Hypertension Brother   . Hypercholesterolemia Brother   . Gout Brother   . Breast cancer Neg Hx    Social History   Social History  . Marital status: Married    Spouse name: N/A  . Number of children: 3  . Years of education: N/A   Social History Main Topics  . Smoking status: Never Smoker  . Smokeless tobacco: Never Used  . Alcohol use 0.0 oz/week     Comment: occasional  . Drug use: No  . Sexual activity: Not Asked   Other Topics Concern  . None   Social History Narrative  . None    Outpatient Encounter Prescriptions as of 12/13/2016  Medication Sig  . aspirin 81 MG tablet Take 81 mg by mouth daily.  . febuxostat (ULORIC) 40 MG tablet Take 1 tablet (40 mg total) by mouth daily.  . metoprolol (LOPRESSOR) 50 MG tablet 1/2 tablet bid  . omega-3 acid ethyl esters (LOVAZA) 1 g capsule TAKE ONE CAPSULE BY MOUTH 4 TIMES A DAY  . omeprazole (PRILOSEC OTC) 20 MG tablet Take 1 tablet (20 mg total) by  mouth daily.  . rosuvastatin (CRESTOR) 5 MG tablet Take 1 tablet (5 mg total) by mouth daily.  Marland Kitchen VICTOZA 18 MG/3ML SOPN INJECT 0.2 ML (1.2 MG TOTAL) UNDER THE SKIN DAILY.  . vitamin E 400 UNIT capsule Take 400 Units by mouth 2 (two) times daily.   No facility-administered encounter medications on file as of 12/13/2016.     Review of Systems  Constitutional: Negative for appetite change and unexpected weight change.  HENT: Negative for congestion and sinus pressure.   Respiratory: Negative for cough, chest tightness and shortness of breath.   Cardiovascular: Negative for chest pain, palpitations and leg swelling.  Gastrointestinal: Negative for abdominal pain, diarrhea, nausea and vomiting.  Genitourinary: Negative for difficulty urinating and dysuria.  Musculoskeletal: Negative for back pain and joint swelling.  Skin: Negative for color change and rash.  Neurological: Negative for dizziness, light-headedness and headaches.  Psychiatric/Behavioral: Negative for agitation and dysphoric mood.       Objective:    Physical Exam  Constitutional: She appears well-developed and well-nourished. No distress.  HENT:  Nose: Nose normal.  Mouth/Throat: Oropharynx is  clear and moist.  Neck: Neck supple. No thyromegaly present.  Cardiovascular: Normal rate and regular rhythm.   Pulmonary/Chest: Breath sounds normal. No respiratory distress. She has no wheezes.  Abdominal: Soft. Bowel sounds are normal. There is no tenderness.  Musculoskeletal: She exhibits no edema or tenderness.  Lymphadenopathy:    She has no cervical adenopathy.  Skin: No rash noted. No erythema.  Psychiatric: She has a normal mood and affect. Her behavior is normal.    BP 136/70 (BP Location: Left Arm, Patient Position: Sitting, Cuff Size: Normal)   Pulse 73   Temp 98.7 F (37.1 C) (Oral)   Resp 16   Ht 5' 1.02" (1.55 m)   Wt 152 lb 3.2 oz (69 kg)   SpO2 97%   BMI 28.74 kg/m  Wt Readings from Last 3 Encounters:    12/13/16 152 lb 3.2 oz (69 kg)  06/10/16 152 lb 6.4 oz (69.1 kg)  02/11/16 157 lb 9.6 oz (71.5 kg)     Lab Results  Component Value Date   WBC 10.3 10/29/2015   HGB 12.6 10/29/2015   HCT 37.9 10/29/2015   PLT 198.0 10/29/2015   GLUCOSE 98 06/24/2016   CHOL 148 06/24/2016   TRIG 171.0 (H) 06/24/2016   HDL 52.70 06/24/2016   LDLDIRECT 66.0 10/29/2015   LDLCALC 61 06/24/2016   ALT 28 06/24/2016   AST 25 06/24/2016   NA 137 06/24/2016   K 4.3 06/24/2016   CL 103 06/24/2016   CREATININE 2.14 (H) 06/24/2016   BUN 34 (H) 06/24/2016   CO2 28 06/24/2016   TSH 2.73 06/24/2016   HGBA1C 5.7 06/24/2016    Mm Digital Screening Bilateral  Result Date: 07/11/2016 CLINICAL DATA:  Screening. EXAM: DIGITAL SCREENING BILATERAL MAMMOGRAM WITH CAD COMPARISON:  Previous exam(s). ACR Breast Density Category b: There are scattered areas of fibroglandular density. FINDINGS: There are no findings suspicious for malignancy. Images were processed with CAD. IMPRESSION: No mammographic evidence of malignancy. A result letter of this screening mammogram will be mailed directly to the patient. RECOMMENDATION: Screening mammogram in one year. (Code:SM-B-01Y) BI-RADS CATEGORY  1: Negative. Electronically Signed   By: Ammie Ferrier M.D.   On: 07/11/2016 10:14       Assessment & Plan:   Problem List Items Addressed This Visit    Abnormal liver function tests    Followed by GI.  S/p liver biopsy.  Follow liver function tests.        Relevant Orders   Hepatic function panel   Chronic kidney disease (CKD), stage IV (severe) ([place])    Followed by nephrology.  Stable.        Relevant Orders   Basic metabolic panel   Hyperglycemia    Low carb diet and exercise. Follow met b and a1c.        Relevant Orders   Hemoglobin A1c   Hypertension    Blood pressure under good control.  Continue same medication regimen.  Follow pressures.  Follow metabolic panel.        Relevant Orders   CBC with  Differential/Platelet   Hypertriglyceridemia    Low cholesterol diet and exercise.  Follow lipid panel.        Relevant Orders   Lipid panel   Metabolic syndrome    On victoza.  Has adjusted her diet.  Maintaining her weight loss.  Follow.        Other Visit Diagnoses    Need for immunization against influenza  Relevant Orders   Flu Vaccine QUAD 36+ mos IM (Completed)       Einar Pheasant, MD

## 2016-12-16 ENCOUNTER — Encounter: Payer: Self-pay | Admitting: Internal Medicine

## 2016-12-16 NOTE — Assessment & Plan Note (Signed)
Followed by GI.  S/p liver biopsy.  Follow liver function tests.

## 2016-12-16 NOTE — Assessment & Plan Note (Signed)
Low carb diet and exercise. Follow met b and a1c.

## 2016-12-16 NOTE — Assessment & Plan Note (Signed)
Followed by nephrology.  Stable.   

## 2016-12-16 NOTE — Assessment & Plan Note (Signed)
Low cholesterol diet and exercise.  Follow lipid panel.   

## 2016-12-16 NOTE — Assessment & Plan Note (Signed)
Blood pressure under good control.  Continue same medication regimen.  Follow pressures.  Follow metabolic panel.   

## 2016-12-16 NOTE — Assessment & Plan Note (Signed)
On victoza.  Has adjusted her diet.  Maintaining her weight loss.  Follow.

## 2016-12-20 ENCOUNTER — Other Ambulatory Visit (INDEPENDENT_AMBULATORY_CARE_PROVIDER_SITE_OTHER): Payer: BC Managed Care – PPO

## 2016-12-20 DIAGNOSIS — R945 Abnormal results of liver function studies: Secondary | ICD-10-CM

## 2016-12-20 DIAGNOSIS — R739 Hyperglycemia, unspecified: Secondary | ICD-10-CM | POA: Diagnosis not present

## 2016-12-20 DIAGNOSIS — I10 Essential (primary) hypertension: Secondary | ICD-10-CM | POA: Diagnosis not present

## 2016-12-20 DIAGNOSIS — E781 Pure hyperglyceridemia: Secondary | ICD-10-CM

## 2016-12-20 DIAGNOSIS — N184 Chronic kidney disease, stage 4 (severe): Secondary | ICD-10-CM

## 2016-12-20 DIAGNOSIS — R7989 Other specified abnormal findings of blood chemistry: Secondary | ICD-10-CM

## 2016-12-20 LAB — BASIC METABOLIC PANEL
BUN: 47 mg/dL — ABNORMAL HIGH (ref 6–23)
CO2: 24 mEq/L (ref 19–32)
Calcium: 9.6 mg/dL (ref 8.4–10.5)
Chloride: 106 mEq/L (ref 96–112)
Creatinine, Ser: 2.27 mg/dL — ABNORMAL HIGH (ref 0.40–1.20)
GFR: 23.51 mL/min — ABNORMAL LOW (ref >60.00)
Glucose, Bld: 101 mg/dL — ABNORMAL HIGH (ref 70–99)
Potassium: 4.2 mEq/L (ref 3.5–5.1)
Sodium: 140 mEq/L (ref 135–145)

## 2016-12-20 LAB — CBC WITH DIFFERENTIAL/PLATELET
BASOS ABS: 0.1 10*3/uL (ref 0.0–0.1)
Basophils Relative: 1.1 % (ref 0.0–3.0)
EOS PCT: 2.3 % (ref 0.0–5.0)
Eosinophils Absolute: 0.2 10*3/uL (ref 0.0–0.7)
HEMATOCRIT: 38.5 % (ref 36.0–46.0)
Hemoglobin: 12.6 g/dL (ref 12.0–15.0)
LYMPHS PCT: 38 % (ref 12.0–46.0)
Lymphs Abs: 3.1 10*3/uL (ref 0.7–4.0)
MCHC: 32.8 g/dL (ref 30.0–36.0)
MCV: 97.2 fl (ref 78.0–100.0)
Monocytes Absolute: 0.6 10*3/uL (ref 0.1–1.0)
Monocytes Relative: 7.6 % (ref 3.0–12.0)
Neutro Abs: 4.2 10*3/uL (ref 1.4–7.7)
Neutrophils Relative %: 51 % (ref 43.0–77.0)
Platelets: 199 10*3/uL (ref 150.0–400.0)
RBC: 3.96 Mil/uL (ref 3.87–5.11)
RDW: 13.1 % (ref 11.5–15.5)
WBC: 8.2 10*3/uL (ref 4.0–10.5)

## 2016-12-20 LAB — LIPID PANEL
CHOLESTEROL: 164 mg/dL (ref 0–200)
HDL: 54.1 mg/dL (ref >39.00)
NonHDL: 110.11
Total CHOL/HDL Ratio: 3
Triglycerides: 278 mg/dL — ABNORMAL HIGH (ref 0.0–149.0)
VLDL: 55.6 mg/dL — ABNORMAL HIGH (ref 0.0–40.0)

## 2016-12-20 LAB — LDL CHOLESTEROL, DIRECT: Direct LDL: 57 mg/dL

## 2016-12-20 LAB — HEPATIC FUNCTION PANEL
ALT: 20 U/L (ref 0–35)
AST: 19 U/L (ref 0–37)
Albumin: 4.3 g/dL (ref 3.5–5.2)
Alkaline Phosphatase: 69 U/L (ref 39–117)
BILIRUBIN DIRECT: 0.1 mg/dL (ref 0.0–0.3)
BILIRUBIN TOTAL: 0.6 mg/dL (ref 0.2–1.2)
Total Protein: 7.2 g/dL (ref 6.0–8.3)

## 2016-12-20 LAB — HEMOGLOBIN A1C: HEMOGLOBIN A1C: 5.5 % (ref 4.6–6.5)

## 2016-12-22 ENCOUNTER — Telehealth: Payer: Self-pay | Admitting: Internal Medicine

## 2016-12-22 NOTE — Telephone Encounter (Signed)
Pt lvm stating that she is returning a call to Dr. Bary Leriche office. I do not see any other phone notes for her. Please cb [phone]

## 2016-12-22 NOTE — Telephone Encounter (Signed)
See lab note tried to call l/m on v/m

## 2016-12-31 ENCOUNTER — Other Ambulatory Visit: Payer: Self-pay | Admitting: Internal Medicine

## 2017-02-27 ENCOUNTER — Other Ambulatory Visit: Payer: Self-pay | Admitting: Internal Medicine

## 2017-03-27 ENCOUNTER — Other Ambulatory Visit: Payer: Self-pay | Admitting: Internal Medicine

## 2017-04-16 ENCOUNTER — Other Ambulatory Visit: Payer: Self-pay | Admitting: Internal Medicine

## 2017-06-15 ENCOUNTER — Ambulatory Visit (INDEPENDENT_AMBULATORY_CARE_PROVIDER_SITE_OTHER): Payer: BC Managed Care – PPO | Admitting: Internal Medicine

## 2017-06-15 ENCOUNTER — Encounter: Payer: Self-pay | Admitting: Internal Medicine

## 2017-06-15 VITALS — BP 128/78 | HR 56 | Temp 98.1°F | Resp 18 | Wt 163.0 lb

## 2017-06-15 DIAGNOSIS — R739 Hyperglycemia, unspecified: Secondary | ICD-10-CM

## 2017-06-15 DIAGNOSIS — R7989 Other specified abnormal findings of blood chemistry: Secondary | ICD-10-CM

## 2017-06-15 DIAGNOSIS — R945 Abnormal results of liver function studies: Secondary | ICD-10-CM

## 2017-06-15 DIAGNOSIS — E781 Pure hyperglyceridemia: Secondary | ICD-10-CM | POA: Diagnosis not present

## 2017-06-15 DIAGNOSIS — Z1239 Encounter for other screening for malignant neoplasm of breast: Secondary | ICD-10-CM

## 2017-06-15 DIAGNOSIS — Z0000 Encounter for general adult medical examination without abnormal findings: Secondary | ICD-10-CM

## 2017-06-15 DIAGNOSIS — I10 Essential (primary) hypertension: Secondary | ICD-10-CM | POA: Diagnosis not present

## 2017-06-15 DIAGNOSIS — Z1231 Encounter for screening mammogram for malignant neoplasm of breast: Secondary | ICD-10-CM | POA: Diagnosis not present

## 2017-06-15 DIAGNOSIS — E8881 Metabolic syndrome: Secondary | ICD-10-CM

## 2017-06-15 DIAGNOSIS — N184 Chronic kidney disease, stage 4 (severe): Secondary | ICD-10-CM

## 2017-06-15 NOTE — Assessment & Plan Note (Signed)
Physical today 06/15/17.  PAP 06/10/16 - negative with negative HPV.  colonoscopy 10/07/08 - recommended f/u in 10 years.  Mammogram 07/11/16 - Birads I.  Scheduled for f/u mammogram.

## 2017-06-15 NOTE — Progress Notes (Signed)
Patient ID: Angel Collins, female   DOB: 1960/12/30, 57 y.o.   MRN: [id number]   Subjective:    Patient ID: Angel Collins, female    DOB: Nov 28, 1960, 57 y.o.   MRN: [id number]  HPI  Patient here for her physical exam.  She is followed by nephrology for CKD.  Has been placed inactive on transplant list.  Kidney function has been stable.  Just evaluated by Dr Alfonse Alpers - 06/09/17.  Stable.  No changes made.  She is off victoza.  Weight has increased.  Discussed with her today.  She does not want to restart the medication.  Wants to work on diet and exercise.  Off prilosec.  She feels she is doing well off the medication.  [place] if she watches what she eats.  No chest pain.  No sob.  No abdominal pain.  Bowels moving.     Past Medical History:  Diagnosis Date  . Abnormal liver function    fatty liver  . 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 Gout   . Hypercholesterolemia   . Hyperglycemia   . Hypertension    Past Surgical History:  Procedure Laterality Date  . CESAREAN SECTION  1983  . ovarian cyst removed  1979  . TUBAL LIGATION  1987   Family History  Problem Relation Age of Onset  . Lung cancer Mother        died age 51  . Liver cancer Mother   . Liver cancer Father   . Hypertension Brother   . Hypercholesterolemia Brother   . Gout Brother   . Breast cancer Neg Hx    Social History   Socioeconomic History  . Marital status: Married    Spouse name: None  . Number of children: 3  . Years of education: None  . Highest education level: None  Social Needs  . Financial resource strain: None  . Food insecurity - worry: None  . Food insecurity - inability: None  . Transportation needs - medical: None  . Transportation needs - non-medical: None  Occupational History  . None  Tobacco Use  . Smoking status: Never Smoker  . Smokeless tobacco: Never Used  Substance and Sexual Activity  . Alcohol use: Yes    Alcohol/week: 0.0 oz    Comment: occasional  .  Drug use: No  . Sexual activity: None  Other Topics Concern  . None  Social History Narrative  . None    Outpatient Encounter Medications as of 06/15/2017  Medication Sig  . aspirin 81 MG tablet Take 81 mg by mouth daily.  . metoprolol tartrate (LOPRESSOR) 50 MG tablet TAKE 1 TABLET (50 MG TOTAL) BY MOUTH 2 (TWO) TIMES DAILY.  Marland Kitchen omega-3 acid ethyl esters (LOVAZA) 1 g capsule TAKE ONE CAPSULE BY MOUTH 4 TIMES A DAY  . omeprazole (PRILOSEC OTC) 20 MG tablet Take 1 tablet (20 mg total) by mouth daily. (Patient not taking: Reported on 06/15/2017)  . rosuvastatin (CRESTOR) 5 MG tablet TAKE 1 TABLET (5 MG TOTAL) BY MOUTH DAILY.  Marland Kitchen ULORIC 40 MG tablet TAKE 1 TABLET BY MOUTH DAILY  . VICTOZA 18 MG/3ML SOPN INJECT 0.2 ML (1.2 MG TOTAL) UNDER THE SKIN DAILY.  . vitamin E 400 UNIT capsule Take 400 Units by mouth 2 (two) times daily.   No facility-administered encounter medications on file as of 06/15/2017.     Review of Systems  Constitutional: Negative for appetite change and unexpected weight change.  HENT: Negative for congestion and sinus pressure.   Eyes: Negative for pain and visual disturbance.  Respiratory: Negative for cough, chest tightness and shortness of breath.   Cardiovascular: Negative for chest pain, palpitations and leg swelling.  Gastrointestinal: Negative for abdominal pain, diarrhea, nausea and vomiting.  Genitourinary: Negative for difficulty urinating and dysuria.  Musculoskeletal: Negative for joint swelling and myalgias.  Skin: Negative for color change and rash.  Neurological: Negative for dizziness, light-headedness and headaches.  Hematological: Negative for adenopathy. Does not bruise/bleed easily.  Psychiatric/Behavioral: Negative for agitation and dysphoric mood.       Objective:    Physical Exam  Constitutional: She is oriented to person, place, and time. She appears well-developed and well-nourished. No distress.  HENT:  Nose: Nose normal.  Mouth/Throat:  Oropharynx is clear and moist.  Eyes: Right eye exhibits no discharge. Left eye exhibits no discharge. No scleral icterus.  Neck: Neck supple. No thyromegaly present.  Cardiovascular: Normal rate and regular rhythm.  1/6 systolic murmur (faint)  Pulmonary/Chest: Breath sounds normal. No accessory muscle usage. No tachypnea. No respiratory distress. She has no decreased breath sounds. She has no wheezes. She has no rhonchi. Right breast exhibits no inverted nipple, no mass, no nipple discharge and no tenderness (no axillary adenopathy). Left breast exhibits no inverted nipple, no mass, no nipple discharge and no tenderness (no axilarry adenopathy).  Abdominal: Soft. Bowel sounds are normal. There is no tenderness.  Musculoskeletal: She exhibits no edema or tenderness.  DP pulses palpable and equal bilaterally.   Lymphadenopathy:    She has no cervical adenopathy.  Neurological: She is alert and oriented to person, place, and time.  Skin: Skin is warm. No rash noted. No erythema.  Psychiatric: She has a normal mood and affect. Her behavior is normal.    BP 128/78 (BP Location: Left Arm, Patient Position: Sitting, Cuff Size: Normal)   Pulse (!) 56   Temp 98.1 F (36.7 C) (Oral)   Resp 18   Wt 163 lb (73.9 kg)   SpO2 97%   BMI 30.77 kg/m  Wt Readings from Last 3 Encounters:  06/15/17 163 lb (73.9 kg)  12/13/16 152 lb 3.2 oz (69 kg)  06/10/16 152 lb 6.4 oz (69.1 kg)     Lab Results  Component Value Date   WBC 8.2 12/20/2016   HGB 12.6 12/20/2016   HCT 38.5 12/20/2016   PLT 199.0 12/20/2016   GLUCOSE 101 (H) 12/20/2016   CHOL 164 12/20/2016   TRIG 278.0 (H) 12/20/2016   HDL 54.10 12/20/2016   LDLDIRECT 57.0 12/20/2016   LDLCALC 61 06/24/2016   ALT 20 12/20/2016   AST 19 12/20/2016   NA 140 12/20/2016   K 4.2 12/20/2016   CL 106 12/20/2016   CREATININE 2.27 (H) 12/20/2016   BUN 47 (H) 12/20/2016   CO2 24 12/20/2016   TSH 2.73 06/24/2016   HGBA1C 5.5 12/20/2016    Mm  Digital Screening Bilateral  Result Date: 07/11/2016 CLINICAL DATA:  Screening. EXAM: DIGITAL SCREENING BILATERAL MAMMOGRAM WITH CAD COMPARISON:  Previous exam(s). ACR Breast Density Category b: There are scattered areas of fibroglandular density. FINDINGS: There are no findings suspicious for malignancy. Images were processed with CAD. IMPRESSION: No mammographic evidence of malignancy. A result letter of this screening mammogram will be mailed directly to the patient. RECOMMENDATION: Screening mammogram in one year. (Code:SM-B-01Y) BI-RADS CATEGORY  1: Negative. Electronically Signed   By: Ammie Ferrier M.D.   On: 07/11/2016 10:14  Assessment & Plan:   Problem List Items Addressed This Visit    Abnormal liver function tests    Followed by GI.  S/p liver biopsy.  Follow liver function tests.        Relevant Orders   Hepatic function panel   Chronic kidney disease (CKD), stage IV (severe) ([place])    Followed by nephrology.  Off transplant list.  Stable on recent check.        Relevant Orders   Basic metabolic panel   Health care maintenance    Physical today 06/15/17.  PAP 06/10/16 - negative with negative HPV.  colonoscopy 10/07/08 - recommended f/u in 10 years.  Mammogram 07/11/16 - Birads I.  Scheduled for f/u mammogram.        Hyperglycemia    Low carb diet and exercise.  Follow met b and a1c.        Relevant Orders   Hemoglobin A1c   Hypertension    Blood pressure under good control.  Continue same medication regimen.  Follow pressures.  Follow metabolic panel.        Relevant Orders   TSH   Hypertriglyceridemia    Low carb diet and exercise.  Follow lipid panel.  On crestor.        Relevant Orders   Lipid panel   Metabolic syndrome    Off victoza.  Weight has increased.  Discussed today.  Discussed diet and exercise.  She declines to restart victoza.  Follow weight.         Other Visit Diagnoses    Routine general medical examination at a health care facility     -  Primary   Screening for breast cancer       Relevant Orders   MM DIGITAL SCREENING BILATERAL       Einar Pheasant, MD

## 2017-06-17 ENCOUNTER — Encounter: Payer: Self-pay | Admitting: Internal Medicine

## 2017-06-17 NOTE — Assessment & Plan Note (Signed)
Followed by GI.  S/p liver biopsy.  Follow liver function tests.

## 2017-06-17 NOTE — Assessment & Plan Note (Signed)
Low carb diet and exercise.  Follow met b and a1c.

## 2017-06-17 NOTE — Assessment & Plan Note (Signed)
Off victoza.  Weight has increased.  Discussed today.  Discussed diet and exercise.  She declines to restart victoza.  Follow weight.

## 2017-06-17 NOTE — Assessment & Plan Note (Signed)
Followed by nephrology.  Off transplant list.  Stable on recent check.

## 2017-06-17 NOTE — Assessment & Plan Note (Signed)
Low carb diet and exercise.  Follow lipid panel.  On crestor 

## 2017-06-17 NOTE — Assessment & Plan Note (Signed)
Blood pressure under good control.  Continue same medication regimen.  Follow pressures.  Follow metabolic panel.   

## 2017-07-12 ENCOUNTER — Ambulatory Visit
Admission: RE | Admit: 2017-07-12 | Discharge: 2017-07-12 | Disposition: A | Discharge status: Home / Self Care | Payer: BC Managed Care – PPO | Source: Ambulatory Visit | Attending: Internal Medicine | Admitting: Internal Medicine

## 2017-07-12 DIAGNOSIS — Z1231 Encounter for screening mammogram for malignant neoplasm of breast: Secondary | ICD-10-CM | POA: Diagnosis present

## 2017-07-12 DIAGNOSIS — Z1239 Encounter for other screening for malignant neoplasm of breast: Secondary | ICD-10-CM

## 2017-08-08 ENCOUNTER — Other Ambulatory Visit: Payer: Self-pay | Admitting: Internal Medicine

## 2017-09-18 ENCOUNTER — Other Ambulatory Visit: Payer: Self-pay | Admitting: Internal Medicine

## 2017-10-12 ENCOUNTER — Other Ambulatory Visit: Payer: Self-pay | Admitting: Internal Medicine

## 2017-12-15 ENCOUNTER — Other Ambulatory Visit (INDEPENDENT_AMBULATORY_CARE_PROVIDER_SITE_OTHER): Payer: BC Managed Care – PPO

## 2017-12-15 DIAGNOSIS — R945 Abnormal results of liver function studies: Secondary | ICD-10-CM

## 2017-12-15 DIAGNOSIS — E781 Pure hyperglyceridemia: Secondary | ICD-10-CM | POA: Diagnosis not present

## 2017-12-15 DIAGNOSIS — I10 Essential (primary) hypertension: Secondary | ICD-10-CM

## 2017-12-15 DIAGNOSIS — N184 Chronic kidney disease, stage 4 (severe): Secondary | ICD-10-CM

## 2017-12-15 DIAGNOSIS — R739 Hyperglycemia, unspecified: Secondary | ICD-10-CM | POA: Diagnosis not present

## 2017-12-15 DIAGNOSIS — R7989 Other specified abnormal findings of blood chemistry: Secondary | ICD-10-CM

## 2017-12-15 LAB — HEPATIC FUNCTION PANEL
ALK PHOS: 73 U/L (ref 39–117)
ALT: 24 U/L (ref 0–35)
AST: 22 U/L (ref 0–37)
Albumin: 4.2 g/dL (ref 3.5–5.2)
BILIRUBIN DIRECT: 0.1 mg/dL (ref 0.0–0.3)
BILIRUBIN TOTAL: 0.5 mg/dL (ref 0.2–1.2)
TOTAL PROTEIN: 7.2 g/dL (ref 6.0–8.3)

## 2017-12-15 LAB — LIPID PANEL
Cholesterol: 159 mg/dL (ref 0–200)
HDL: 48.7 mg/dL (ref >39.00)
NonHDL: 109.82
Total CHOL/HDL Ratio: 3
Triglycerides: 325 mg/dL — ABNORMAL HIGH (ref 0.0–149.0)
VLDL: 65 mg/dL — ABNORMAL HIGH (ref 0.0–40.0)

## 2017-12-15 LAB — BASIC METABOLIC PANEL
BUN: 45 mg/dL — ABNORMAL HIGH (ref 6–23)
CALCIUM: 9.4 mg/dL (ref 8.4–10.5)
CHLORIDE: 106 meq/L (ref 96–112)
CO2: 24 meq/L (ref 19–32)
Creatinine, Ser: 2.61 mg/dL — ABNORMAL HIGH (ref 0.40–1.20)
GFR: 19.94 mL/min — ABNORMAL LOW (ref >60.00)
Glucose, Bld: 104 mg/dL — ABNORMAL HIGH (ref 70–99)
Potassium: 4.6 mEq/L (ref 3.5–5.1)
SODIUM: 139 meq/L (ref 135–145)

## 2017-12-15 LAB — LDL CHOLESTEROL, DIRECT: Direct LDL: 58 mg/dL

## 2017-12-15 LAB — TSH: TSH: 4.52 u[IU]/mL — AB (ref 0.35–4.50)

## 2017-12-15 LAB — HEMOGLOBIN A1C: HEMOGLOBIN A1C: 6 % (ref 4.6–6.5)

## 2017-12-18 ENCOUNTER — Ambulatory Visit: Payer: BC Managed Care – PPO | Admitting: Internal Medicine

## 2017-12-18 VITALS — BP 122/70 | HR 61 | Temp 98.7°F | Resp 16 | Wt 169.0 lb

## 2017-12-18 DIAGNOSIS — R945 Abnormal results of liver function studies: Secondary | ICD-10-CM

## 2017-12-18 DIAGNOSIS — N184 Chronic kidney disease, stage 4 (severe): Secondary | ICD-10-CM

## 2017-12-18 DIAGNOSIS — R739 Hyperglycemia, unspecified: Secondary | ICD-10-CM | POA: Diagnosis not present

## 2017-12-18 DIAGNOSIS — K219 Gastro-esophageal reflux disease without esophagitis: Secondary | ICD-10-CM

## 2017-12-18 DIAGNOSIS — I10 Essential (primary) hypertension: Secondary | ICD-10-CM | POA: Diagnosis not present

## 2017-12-18 DIAGNOSIS — Z23 Encounter for immunization: Secondary | ICD-10-CM | POA: Diagnosis not present

## 2017-12-18 DIAGNOSIS — R7989 Other specified abnormal findings of blood chemistry: Secondary | ICD-10-CM

## 2017-12-18 DIAGNOSIS — E781 Pure hyperglyceridemia: Secondary | ICD-10-CM

## 2017-12-18 DIAGNOSIS — E8881 Metabolic syndrome: Secondary | ICD-10-CM

## 2017-12-18 NOTE — Progress Notes (Signed)
Patient ID: Angel Collins, female   DOB: [birth year], 59 y.o.   MRN: [id number]   Subjective:    Patient ID: Angel Collins, female    DOB: 1960-07-14, 57 y.o.   MRN: [id number]  HPI  Patient here for a scheduled follow up.  She reports she is doing relatively well.  Has gained some weight. Not watching her diet.  Discussed diet and exercise.  No chest pain.  No sob.  Does report acid reflux.  Taking pepcid.  Discussed increasing dose to see if could get the acid reflux under better control.  No abdominal pain.  Bowels moving.  Discussed labs.  Renal function has decreased.     Past Medical History:  Diagnosis Date  . Abnormal liver function    fatty liver  . 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 Gout   . Hypercholesterolemia   . Hyperglycemia   . Hypertension    Past Surgical History:  Procedure Laterality Date  . CESAREAN SECTION  1983  . ovarian cyst removed  1979  . TUBAL LIGATION  1987   Family History  Problem Relation Age of Onset  . Lung cancer Mother        died age 93  . Liver cancer Mother   . Liver cancer Father   . Hypertension Brother   . Hypercholesterolemia Brother   . Gout Brother   . Breast cancer Neg Hx    Social History   Socioeconomic History  . Marital status: Married    Spouse name: Not on file  . Number of children: 3  . Years of education: Not on file  . Highest education level: Not on file  Occupational History  . Not on file  Social Needs  . Financial resource strain: Not on file  . Food insecurity:    Worry: Not on file    Inability: Not on file  . Transportation needs:    Medical: Not on file    Non-medical: Not on file  Tobacco Use  . Smoking status: Never Smoker  . Smokeless tobacco: Never Used  Substance and Sexual Activity  . Alcohol use: Yes    Alcohol/week: 0.0 standard drinks    Comment: occasional  . Drug use: No  . Sexual activity: Not on file  Lifestyle  . Physical activity:    Days per  week: Not on file    Minutes per session: Not on file  . Stress: Not on file  Relationships  . Social connections:    Talks on phone: Not on file    Gets together: Not on file    Attends religious service: Not on file    Active member of club or organization: Not on file    Attends meetings of clubs or organizations: Not on file    Relationship status: Not on file  Other Topics Concern  . Not on file  Social History Narrative  . Not on file    Outpatient Encounter Medications as of 12/18/2017  Medication Sig  . aspirin 81 MG tablet Take 81 mg by mouth daily.  . metoprolol tartrate (LOPRESSOR) 50 MG tablet TAKE 1 TABLET (50 MG TOTAL) BY MOUTH 2 (TWO) TIMES DAILY.  Marland Kitchen omega-3 acid ethyl esters (LOVAZA) 1 g capsule TAKE ONE CAPSULE BY MOUTH 4 TIMES A DAY  . rosuvastatin (CRESTOR) 5 MG tablet TAKE 1 TABLET (5 MG TOTAL) BY MOUTH DAILY.  Marland Kitchen ULORIC 40 MG tablet TAKE 1 TABLET BY  MOUTH DAILY  . vitamin E 400 UNIT capsule Take 400 Units by mouth 2 (two) times daily.  . [DISCONTINUED] omeprazole (PRILOSEC OTC) 20 MG tablet Take 1 tablet (20 mg total) by mouth daily. (Patient not taking: Reported on 06/15/2017)  . [DISCONTINUED] VICTOZA 18 MG/3ML SOPN INJECT 0.2 ML (1.2 MG TOTAL) UNDER THE SKIN DAILY.   No facility-administered encounter medications on file as of 12/18/2017.     Review of Systems  Constitutional: Negative for appetite change and unexpected weight change.  HENT: Negative for congestion and sinus pressure.   Respiratory: Negative for cough, chest tightness and shortness of breath.   Cardiovascular: Negative for chest pain, palpitations and leg swelling.  Gastrointestinal: Negative for abdominal pain, diarrhea, nausea and vomiting.  Genitourinary: Negative for difficulty urinating and dysuria.  Musculoskeletal: Negative for joint swelling and myalgias.  Skin: Negative for color change and rash.  Neurological: Negative for dizziness, light-headedness and headaches.    Psychiatric/Behavioral: Negative for agitation and dysphoric mood.       Objective:     Blood pressure rechecked by me:  128/78  Physical Exam  Constitutional: She appears well-developed and well-nourished. No distress.  HENT:  Nose: Nose normal.  Mouth/Throat: Oropharynx is clear and moist.  Neck: Neck supple. No thyromegaly present.  Cardiovascular: Normal rate and regular rhythm.  Pulmonary/Chest: Breath sounds normal. No respiratory distress. She has no wheezes.  Abdominal: Soft. Bowel sounds are normal. There is no tenderness.  Musculoskeletal: She exhibits no edema or tenderness.  Lymphadenopathy:    She has no cervical adenopathy.  Skin: No rash noted. No erythema.  Psychiatric: She has a normal mood and affect. Her behavior is normal.    BP 122/70 (BP Location: Left Arm, Patient Position: Sitting, Cuff Size: Normal)   Pulse 61   Temp 98.7 F (37.1 C) (Oral)   Resp 16   Wt 169 lb (76.7 kg)   SpO2 97%   BMI 31.91 kg/m  Wt Readings from Last 3 Encounters:  12/18/17 169 lb (76.7 kg)  06/15/17 163 lb (73.9 kg)  12/13/16 152 lb 3.2 oz (69 kg)     Lab Results  Component Value Date   WBC 8.2 12/20/2016   HGB 12.6 12/20/2016   HCT 38.5 12/20/2016   PLT 199.0 12/20/2016   GLUCOSE 104 (H) 12/15/2017   CHOL 159 12/15/2017   TRIG 325.0 (H) 12/15/2017   HDL 48.70 12/15/2017   LDLDIRECT 58.0 12/15/2017   LDLCALC 61 06/24/2016   ALT 24 12/15/2017   AST 22 12/15/2017   NA 139 12/15/2017   K 4.6 12/15/2017   CL 106 12/15/2017   CREATININE 2.61 (H) 12/15/2017   BUN 45 (H) 12/15/2017   CO2 24 12/15/2017   TSH 4.52 (H) 12/15/2017   HGBA1C 6.0 12/15/2017    Mm Digital Screening Bilateral  Result Date: 07/12/2017 CLINICAL DATA:  Screening. EXAM: DIGITAL SCREENING BILATERAL MAMMOGRAM WITH CAD COMPARISON:  Previous exam(s). ACR Breast Density Category b: There are scattered areas of fibroglandular density. FINDINGS: There are no findings suspicious for malignancy.  Images were processed with CAD. IMPRESSION: No mammographic evidence of malignancy. A result letter of this screening mammogram will be mailed directly to the patient. RECOMMENDATION: Screening mammogram in one year. (Code:SM-B-01Y) BI-RADS CATEGORY  1: Negative. Electronically Signed   By: Claudie Revering M.D.   On: 07/12/2017 12:20       Assessment & Plan:   Problem List Items Addressed This Visit    Abnormal liver function tests  Has been worked up by GI.  S/p liver biopsy.  Recent liver panel wnl.  Follow liver function tests.        Relevant Orders   Hepatic function panel   Chronic kidney disease (CKD), stage IV (severe) ([place])    Followed by nephrology.  Off transplant list.  Recent GFR decreased.  Forward labs to Dr Alfonse Alpers.  Has f/u soon.        Relevant Orders   Basic metabolic panel   GERD (gastroesophageal reflux disease)    Acid reflux as outlined.  On pepcid.  Will increase to bid.  Follow.  Notify me if persistent acid reflux.        Hyperglycemia    Low carb diet and exercise.  Follow met b and a1c.        Relevant Orders   Hemoglobin A1c   Hypertension    Blood pressure under good control.  Continue same medication regimen.  Follow pressures.  Follow metabolic panel.        Relevant Orders   CBC with Differential/Platelet   Hypertriglyceridemia    On crestor.  Low carb diet.  Follow lipid panel and liver function tests.        Relevant Orders   Lipid panel   Metabolic syndrome    Off victoza.  Weight has increased.  Discussed with her today.  Discussed the need for diet, exercise and weight loss.  Discussed possibly restarting victoza. She prefers to work on diet and exercise.  Follow.         Other Visit Diagnoses    Need for influenza vaccination    -  Primary   Relevant Orders   Flu Vaccine QUAD 6+ mos PF IM (Fluarix Quad PF) (Completed)   Elevated TSH       Relevant Orders   TSH       Einar Pheasant, MD

## 2017-12-23 ENCOUNTER — Encounter: Payer: Self-pay | Admitting: Internal Medicine

## 2017-12-23 DIAGNOSIS — K219 Gastro-esophageal reflux disease without esophagitis: Secondary | ICD-10-CM | POA: Insufficient documentation

## 2017-12-23 NOTE — Assessment & Plan Note (Signed)
Followed by nephrology.  Off transplant list.  Recent GFR decreased.  Forward labs to Dr Alfonse Alpers.  Has f/u soon.

## 2017-12-23 NOTE — Assessment & Plan Note (Signed)
Blood pressure under good control.  Continue same medication regimen.  Follow pressures.  Follow metabolic panel.   

## 2017-12-23 NOTE — Assessment & Plan Note (Signed)
Acid reflux as outlined.  On pepcid.  Will increase to bid.  Follow.  Notify me if persistent acid reflux.

## 2017-12-23 NOTE — Assessment & Plan Note (Signed)
On crestor.  Low carb diet.  Follow lipid panel and liver function tests.

## 2017-12-23 NOTE — Assessment & Plan Note (Signed)
Low carb diet and exercise.  Follow met b and a1c.   

## 2017-12-23 NOTE — Assessment & Plan Note (Signed)
Has been worked up by GI.  S/p liver biopsy.  Recent liver panel wnl.  Follow liver function tests.

## 2017-12-23 NOTE — Assessment & Plan Note (Signed)
Off victoza.  Weight has increased.  Discussed with her today.  Discussed the need for diet, exercise and weight loss.  Discussed possibly restarting victoza. She prefers to work on diet and exercise.  Follow.

## 2018-01-13 ENCOUNTER — Other Ambulatory Visit: Payer: Self-pay | Admitting: Internal Medicine

## 2018-01-14 ENCOUNTER — Other Ambulatory Visit: Payer: Self-pay | Admitting: Internal Medicine

## 2018-02-21 ENCOUNTER — Other Ambulatory Visit: Payer: Self-pay | Admitting: Internal Medicine

## 2018-03-13 ENCOUNTER — Other Ambulatory Visit: Payer: Self-pay | Admitting: Internal Medicine

## 2018-03-28 ENCOUNTER — Other Ambulatory Visit (INDEPENDENT_AMBULATORY_CARE_PROVIDER_SITE_OTHER): Payer: BC Managed Care – PPO

## 2018-03-28 DIAGNOSIS — R945 Abnormal results of liver function studies: Secondary | ICD-10-CM

## 2018-03-28 DIAGNOSIS — N184 Chronic kidney disease, stage 4 (severe): Secondary | ICD-10-CM | POA: Diagnosis not present

## 2018-03-28 DIAGNOSIS — R739 Hyperglycemia, unspecified: Secondary | ICD-10-CM | POA: Diagnosis not present

## 2018-03-28 DIAGNOSIS — I10 Essential (primary) hypertension: Secondary | ICD-10-CM

## 2018-03-28 DIAGNOSIS — E781 Pure hyperglyceridemia: Secondary | ICD-10-CM | POA: Diagnosis not present

## 2018-03-28 DIAGNOSIS — R7989 Other specified abnormal findings of blood chemistry: Secondary | ICD-10-CM

## 2018-03-28 LAB — LIPID PANEL
CHOLESTEROL: 157 mg/dL (ref 0–200)
HDL: 45.4 mg/dL (ref >39.00)
NonHDL: 111.7
TRIGLYCERIDES: 356 mg/dL — AB (ref 0.0–149.0)
Total CHOL/HDL Ratio: 3
VLDL: 71.2 mg/dL — ABNORMAL HIGH (ref 0.0–40.0)

## 2018-03-28 LAB — HEPATIC FUNCTION PANEL
ALK PHOS: 87 U/L (ref 39–117)
ALT: 29 U/L (ref 0–35)
AST: 23 U/L (ref 0–37)
Albumin: 4.6 g/dL (ref 3.5–5.2)
BILIRUBIN DIRECT: 0.1 mg/dL (ref 0.0–0.3)
BILIRUBIN TOTAL: 0.6 mg/dL (ref 0.2–1.2)
TOTAL PROTEIN: 7.7 g/dL (ref 6.0–8.3)

## 2018-03-28 LAB — CBC WITH DIFFERENTIAL/PLATELET
BASOS ABS: 0 10*3/uL (ref 0.0–0.1)
BASOS PCT: 0.6 % (ref 0.0–3.0)
EOS ABS: 0.3 10*3/uL (ref 0.0–0.7)
Eosinophils Relative: 3.6 % (ref 0.0–5.0)
HEMATOCRIT: 36.4 % (ref 36.0–46.0)
Hemoglobin: 12.2 g/dL (ref 12.0–15.0)
LYMPHS ABS: 2.7 10*3/uL (ref 0.7–4.0)
LYMPHS PCT: 37.7 % (ref 12.0–46.0)
MCHC: 33.6 g/dL (ref 30.0–36.0)
MCV: 95.5 fl (ref 78.0–100.0)
MONOS PCT: 9.8 % (ref 3.0–12.0)
Monocytes Absolute: 0.7 10*3/uL (ref 0.1–1.0)
NEUTROS PCT: 48.3 % (ref 43.0–77.0)
Neutro Abs: 3.4 10*3/uL (ref 1.4–7.7)
Platelets: 189 10*3/uL (ref 150.0–400.0)
RBC: 3.81 Mil/uL — ABNORMAL LOW (ref 3.87–5.11)
RDW: 12.9 % (ref 11.5–15.5)
WBC: 7.1 10*3/uL (ref 4.0–10.5)

## 2018-03-28 LAB — HEMOGLOBIN A1C: Hgb A1c MFr Bld: 5.9 % (ref 4.6–6.5)

## 2018-03-28 LAB — BASIC METABOLIC PANEL
BUN: 53 mg/dL — ABNORMAL HIGH (ref 6–23)
CALCIUM: 9.9 mg/dL (ref 8.4–10.5)
CO2: 21 mEq/L (ref 19–32)
CREATININE: 2.7 mg/dL — AB (ref 0.40–1.20)
Chloride: 108 mEq/L (ref 96–112)
GFR: 19.16 mL/min — ABNORMAL LOW (ref >60.00)
Glucose, Bld: 124 mg/dL — ABNORMAL HIGH (ref 70–99)
Potassium: 4.4 mEq/L (ref 3.5–5.1)
SODIUM: 140 meq/L (ref 135–145)

## 2018-03-28 LAB — LDL CHOLESTEROL, DIRECT: Direct LDL: 54 mg/dL

## 2018-03-28 LAB — TSH: TSH: 3.97 u[IU]/mL (ref 0.35–4.50)

## 2018-03-30 ENCOUNTER — Encounter: Payer: Self-pay | Admitting: Internal Medicine

## 2018-03-30 ENCOUNTER — Ambulatory Visit: Payer: BC Managed Care – PPO | Admitting: Internal Medicine

## 2018-03-30 DIAGNOSIS — E8881 Metabolic syndrome: Secondary | ICD-10-CM

## 2018-03-30 DIAGNOSIS — D649 Anemia, unspecified: Secondary | ICD-10-CM

## 2018-03-30 DIAGNOSIS — K219 Gastro-esophageal reflux disease without esophagitis: Secondary | ICD-10-CM

## 2018-03-30 DIAGNOSIS — N184 Chronic kidney disease, stage 4 (severe): Secondary | ICD-10-CM

## 2018-03-30 DIAGNOSIS — R945 Abnormal results of liver function studies: Secondary | ICD-10-CM | POA: Diagnosis not present

## 2018-03-30 DIAGNOSIS — E781 Pure hyperglyceridemia: Secondary | ICD-10-CM

## 2018-03-30 DIAGNOSIS — R7989 Other specified abnormal findings of blood chemistry: Secondary | ICD-10-CM

## 2018-03-30 DIAGNOSIS — I10 Essential (primary) hypertension: Secondary | ICD-10-CM

## 2018-03-30 DIAGNOSIS — R739 Hyperglycemia, unspecified: Secondary | ICD-10-CM

## 2018-03-30 NOTE — Progress Notes (Signed)
Patient ID: JENTRI AYE, female   DOB: 1960-07-15, 57 y.o.   MRN: [id number]   Subjective:    Patient ID: [name], female    DOB: 05/02/1960, 57 y.o.   MRN: [id number]  HPI  Patient here for a scheduled follow up.  Has CKD.  Followed by nephrology.  Recent decrease in renal function.  Discussed with her today.  Will forward labs to Dr Alfonse Alpers.  Staying active.  No chest pain.  No sob.  No acid reflux.  No abdominal pain.  Bowels moving.  No urine change.  Overall feels she is doing well.  Has gained weight.  Previously on saxenda.  Off now.  Discussed diet and exercise and need for weight loss.     Past Medical History:  Diagnosis Date  . Abnormal liver function    fatty liver  . 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 Gout   . Hypercholesterolemia   . Hyperglycemia   . Hypertension    Past Surgical History:  Procedure Laterality Date  . CESAREAN SECTION  1983  . ovarian cyst removed  1979  . TUBAL LIGATION  1987   Family History  Problem Relation Age of Onset  . Lung cancer Mother        died age 21  . Liver cancer Mother   . Liver cancer Father   . Hypertension Brother   . Hypercholesterolemia Brother   . Gout Brother   . Breast cancer Neg Hx    Social History   Socioeconomic History  . Marital status: Married    Spouse name: Not on file  . Number of children: 3  . Years of education: Not on file  . Highest education level: Not on file  Occupational History  . Not on file  Social Needs  . Financial resource strain: Not on file  . Food insecurity:    Worry: Not on file    Inability: Not on file  . Transportation needs:    Medical: Not on file    Non-medical: Not on file  Tobacco Use  . Smoking status: Never Smoker  . Smokeless tobacco: Never Used  Substance and Sexual Activity  . Alcohol use: Yes    Alcohol/week: 0.0 standard drinks    Comment: occasional  . Drug use: No  . Sexual activity: Not on file  Lifestyle  .  Physical activity:    Days per week: Not on file    Minutes per session: Not on file  . Stress: Not on file  Relationships  . Social connections:    Talks on phone: Not on file    Gets together: Not on file    Attends religious service: Not on file    Active member of club or organization: Not on file    Attends meetings of clubs or organizations: Not on file    Relationship status: Not on file  Other Topics Concern  . Not on file  Social History Narrative  . Not on file    Outpatient Encounter Medications as of 03/30/2018  Medication Sig  . aspirin 81 MG tablet Take 81 mg by mouth daily.  . famotidine (PEPCID) 20 MG tablet Take 20 mg by mouth daily.  . febuxostat (ULORIC) 40 MG tablet TAKE 1 TABLET BY MOUTH DAILY  . metoprolol tartrate (LOPRESSOR) 50 MG tablet TAKE 1 TABLET (50 MG TOTAL) BY MOUTH 2 (TWO) TIMES DAILY.  Marland Kitchen omega-3 acid ethyl esters (LOVAZA) 1  g capsule TAKE ONE CAPSULE BY MOUTH 4 TIMES A DAY  . rosuvastatin (CRESTOR) 5 MG tablet TAKE 1 TABLET (5 MG TOTAL) BY MOUTH DAILY.  . vitamin E 400 UNIT capsule Take 400 Units by mouth 2 (two) times daily.   No facility-administered encounter medications on file as of 03/30/2018.     Review of Systems  Constitutional: Negative for appetite change and unexpected weight change.  HENT: Negative for congestion and sinus pressure.   Respiratory: Negative for cough, chest tightness and shortness of breath.   Cardiovascular: Negative for chest pain, palpitations and leg swelling.  Gastrointestinal: Negative for abdominal pain, diarrhea, nausea and vomiting.  Genitourinary: Negative for difficulty urinating and dysuria.  Musculoskeletal: Negative for joint swelling and myalgias.  Skin: Negative for color change and rash.  Neurological: Negative for dizziness and headaches.  Psychiatric/Behavioral: Negative for agitation and dysphoric mood.       Objective:    Physical Exam Constitutional:      General: She is not in acute  distress.    Appearance: Normal appearance.  HENT:     Nose: Nose normal. No congestion.     Mouth/Throat:     Pharynx: No oropharyngeal exudate or posterior oropharyngeal erythema.  Neck:     Musculoskeletal: Neck supple. No muscular tenderness.     Thyroid: No thyromegaly.  Cardiovascular:     Rate and Rhythm: Normal rate and regular rhythm.  Pulmonary:     Effort: No respiratory distress.     Breath sounds: Normal breath sounds. No wheezing.  Abdominal:     General: Bowel sounds are normal.     Palpations: Abdomen is soft.     Tenderness: There is no abdominal tenderness.  Musculoskeletal:        General: No swelling or tenderness.  Lymphadenopathy:     Cervical: No cervical adenopathy.  Skin:    Findings: No erythema or rash.  Neurological:     Mental Status: She is alert.  Psychiatric:        Mood and Affect: Mood normal.        Behavior: Behavior normal.     BP 118/74 (BP Location: Left Arm, Patient Position: Sitting, Cuff Size: Normal)   Pulse 65   Temp 98.5 F (36.9 C) (Oral)   Resp 16   Wt 169 lb 12.8 oz (77 kg)   SpO2 97%   BMI 32.06 kg/m  Wt Readings from Last 3 Encounters:  03/30/18 169 lb 12.8 oz (77 kg)  12/18/17 169 lb (76.7 kg)  06/15/17 163 lb (73.9 kg)     Lab Results  Component Value Date   WBC 7.1 03/28/2018   HGB 12.2 03/28/2018   HCT 36.4 03/28/2018   PLT 189.0 03/28/2018   GLUCOSE 124 (H) 03/28/2018   CHOL 157 03/28/2018   TRIG 356.0 (H) 03/28/2018   HDL 45.40 03/28/2018   LDLDIRECT 54.0 03/28/2018   LDLCALC 61 06/24/2016   ALT 29 03/28/2018   AST 23 03/28/2018   NA 140 03/28/2018   K 4.4 03/28/2018   CL 108 03/28/2018   CREATININE 2.70 (H) 03/28/2018   BUN 53 (H) 03/28/2018   CO2 21 03/28/2018   TSH 3.97 03/28/2018   HGBA1C 5.9 03/28/2018    Mm Digital Screening Bilateral  Result Date: 07/12/2017 CLINICAL DATA:  Screening. EXAM: DIGITAL SCREENING BILATERAL MAMMOGRAM WITH CAD COMPARISON:  Previous exam(s). ACR Breast  Density Category b: There are scattered areas of fibroglandular density. FINDINGS: There are no findings suspicious for  malignancy. Images were processed with CAD. IMPRESSION: No mammographic evidence of malignancy. A result letter of this screening mammogram will be mailed directly to the patient. RECOMMENDATION: Screening mammogram in one year. (Code:SM-B-01Y) BI-RADS CATEGORY  1: Negative. Electronically Signed   By: Claudie Revering M.D.   On: 07/12/2017 12:20       Assessment & Plan:   Problem List Items Addressed This Visit    Abnormal liver function tests    Has been worked up by GI.  S/p liver biopsy.  Discussed diet and exercise.  Follow liver function tests.        Anemia    Follow cbc and ferritin.        Relevant Orders   Ferritin   Chronic kidney disease (CKD), stage IV (severe) ([place])    Has been followed by nephrology.  Has been off transplant list.  Recent GFR decreased.  Forward labs to Dr Alfonse Alpers.        GERD (gastroesophageal reflux disease)    Controlled on current regimen.  Follow.        Relevant Medications   famotidine (PEPCID) 20 MG tablet   Hyperglycemia    Low carb diet and exercise.  Follow met b and a1c.        Relevant Orders   Hemoglobin A1c   Hypertension    Blood pressure under good control.  Continue same medication regimen.  Follow pressures.  Follow metabolic panel.        Relevant Orders   Basic metabolic panel   Hypertriglyceridemia    Low carb diet and exercise.  Follow lipid panel.        Relevant Orders   Hepatic function panel   Lipid panel   Metabolic syndrome    Off saxenda.  Weight has increased.  Discussed diet and exercise.  Discussed restarting saxenda.  She wants to work on diet and exercise.  Follow.  If persistent increase, will discuss starting saxenda.            Einar Pheasant, MD

## 2018-04-07 ENCOUNTER — Encounter: Payer: Self-pay | Admitting: Internal Medicine

## 2018-04-07 NOTE — Assessment & Plan Note (Signed)
Has been followed by nephrology.  Has been off transplant list.  Recent GFR decreased.  Forward labs to Dr Alfonse Alpers.

## 2018-04-07 NOTE — Assessment & Plan Note (Signed)
Blood pressure under good control.  Continue same medication regimen.  Follow pressures.  Follow metabolic panel.   

## 2018-04-07 NOTE — Assessment & Plan Note (Signed)
Follow cbc and ferritin.  

## 2018-04-07 NOTE — Assessment & Plan Note (Signed)
Off saxenda.  Weight has increased.  Discussed diet and exercise.  Discussed restarting saxenda.  She wants to work on diet and exercise.  Follow.  If persistent increase, will discuss starting saxenda.

## 2018-04-07 NOTE — Assessment & Plan Note (Signed)
Low carb diet and exercise.  Follow lipid panel.  

## 2018-04-07 NOTE — Assessment & Plan Note (Signed)
Low carb diet and exercise.  Follow met b and a1c.   

## 2018-04-07 NOTE — Assessment & Plan Note (Signed)
Controlled on current regimen.  Follow.  

## 2018-04-07 NOTE — Assessment & Plan Note (Signed)
Has been worked up by GI.  S/p liver biopsy.  Discussed diet and exercise.  Follow liver function tests.

## 2018-07-03 ENCOUNTER — Other Ambulatory Visit: Payer: Self-pay | Admitting: Internal Medicine

## 2018-07-18 ENCOUNTER — Other Ambulatory Visit: Payer: Self-pay | Admitting: Internal Medicine

## 2018-08-03 ENCOUNTER — Other Ambulatory Visit (INDEPENDENT_AMBULATORY_CARE_PROVIDER_SITE_OTHER): Payer: BC Managed Care – PPO

## 2018-08-03 ENCOUNTER — Other Ambulatory Visit: Payer: Self-pay

## 2018-08-03 DIAGNOSIS — D649 Anemia, unspecified: Secondary | ICD-10-CM | POA: Diagnosis not present

## 2018-08-03 DIAGNOSIS — R739 Hyperglycemia, unspecified: Secondary | ICD-10-CM | POA: Diagnosis not present

## 2018-08-03 DIAGNOSIS — E781 Pure hyperglyceridemia: Secondary | ICD-10-CM

## 2018-08-03 DIAGNOSIS — I10 Essential (primary) hypertension: Secondary | ICD-10-CM | POA: Diagnosis not present

## 2018-08-03 LAB — HEPATIC FUNCTION PANEL
ALT: 35 U/L (ref 0–35)
AST: 29 U/L (ref 0–37)
Albumin: 4.5 g/dL (ref 3.5–5.2)
Alkaline Phosphatase: 92 U/L (ref 39–117)
Bilirubin, Direct: 0.1 mg/dL (ref 0.0–0.3)
Total Bilirubin: 0.5 mg/dL (ref 0.2–1.2)
Total Protein: 7.6 g/dL (ref 6.0–8.3)

## 2018-08-03 LAB — BASIC METABOLIC PANEL
BUN: 50 mg/dL — ABNORMAL HIGH (ref 6–23)
CO2: 23 mEq/L (ref 19–32)
Calcium: 9.7 mg/dL (ref 8.4–10.5)
Chloride: 108 mEq/L (ref 96–112)
Creatinine, Ser: 2.61 mg/dL — ABNORMAL HIGH (ref 0.40–1.20)
GFR: 18.72 mL/min — ABNORMAL LOW (ref >60.00)
Glucose, Bld: 120 mg/dL — ABNORMAL HIGH (ref 70–99)
Potassium: 4.5 mEq/L (ref 3.5–5.1)
Sodium: 141 mEq/L (ref 135–145)

## 2018-08-03 LAB — LDL CHOLESTEROL, DIRECT: Direct LDL: 51 mg/dL

## 2018-08-03 LAB — LIPID PANEL
Cholesterol: 165 mg/dL (ref 0–200)
HDL: 46.7 mg/dL (ref >39.00)
NonHDL: 118.38
Total CHOL/HDL Ratio: 4
Triglycerides: 354 mg/dL — ABNORMAL HIGH (ref 0.0–149.0)
VLDL: 70.8 mg/dL — ABNORMAL HIGH (ref 0.0–40.0)

## 2018-08-03 LAB — HEMOGLOBIN A1C: Hgb A1c MFr Bld: 6.1 % (ref 4.6–6.5)

## 2018-08-03 LAB — FERRITIN: Ferritin: 247.6 ng/mL (ref 10.0–291.0)

## 2018-08-07 ENCOUNTER — Ambulatory Visit (INDEPENDENT_AMBULATORY_CARE_PROVIDER_SITE_OTHER): Payer: BC Managed Care – PPO | Admitting: Internal Medicine

## 2018-08-07 ENCOUNTER — Other Ambulatory Visit: Payer: Self-pay

## 2018-08-07 ENCOUNTER — Encounter: Payer: Self-pay | Admitting: Internal Medicine

## 2018-08-07 DIAGNOSIS — R945 Abnormal results of liver function studies: Secondary | ICD-10-CM | POA: Diagnosis not present

## 2018-08-07 DIAGNOSIS — R7989 Other specified abnormal findings of blood chemistry: Secondary | ICD-10-CM

## 2018-08-07 DIAGNOSIS — K219 Gastro-esophageal reflux disease without esophagitis: Secondary | ICD-10-CM

## 2018-08-07 DIAGNOSIS — N184 Chronic kidney disease, stage 4 (severe): Secondary | ICD-10-CM

## 2018-08-07 DIAGNOSIS — R739 Hyperglycemia, unspecified: Secondary | ICD-10-CM

## 2018-08-07 DIAGNOSIS — E781 Pure hyperglyceridemia: Secondary | ICD-10-CM

## 2018-08-07 DIAGNOSIS — D649 Anemia, unspecified: Secondary | ICD-10-CM | POA: Diagnosis not present

## 2018-08-07 DIAGNOSIS — I10 Essential (primary) hypertension: Secondary | ICD-10-CM

## 2018-08-07 DIAGNOSIS — E8881 Metabolic syndrome: Secondary | ICD-10-CM

## 2018-08-07 NOTE — Progress Notes (Signed)
Patient ID: Angel Collins, female   DOB: 09-03-1960, 58 y.o.   MRN: [id number] Virtual Visit via video Note  This visit type was conducted due to national recommendations for restrictions regarding the COVID-19 pandemic (e.g. social distancing).  This format is felt to be most appropriate for this patient at this time.  All issues noted in this document were discussed and addressed.  No physical exam was performed (except for noted visual exam findings with Video Visits).   I connected with Angel Collins on 08/07/18 at  3:00 PM EDT by a video enabled telemedicine application and verified that I am speaking with the correct person using two identifiers. Location patient: home Location provider: work Persons participating in the virtual visit: patient, provider  I discussed the limitations, risks, security and privacy concerns of performing an evaluation and management service by video and the availability of in person appointments.  The patient expressed understanding and agreed to proceed.   Reason for visit: scheduled follow up.  HPI: She reports she is doing relatively well.  Feels good. Discussed diet and exercise.  She reports she has not gained weight.  states weight is stable from her last check.  She is due to f/u with nephrology this week.  Just had labs.  GFR 18.  Triglycerides still elevated.  a1c 6.1.  Discussed restarting saxenda.  No chest pain.  No sob.  Some acid reflux.  Discussed pepcid.  No abdominal pain.  Bowels moving.  Some congestion.  Relates to allergies.  No known COVID exposure.  No fever.  No cough, congestion or sob.      ROS: See pertinent positives and negatives per HPI.  Past Medical History:  Diagnosis Date  . Abnormal liver function    fatty liver  . 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 Gout   . Hypercholesterolemia   . Hyperglycemia   . Hypertension     Past Surgical History:  Procedure Laterality Date  . CESAREAN SECTION   1983  . ovarian cyst removed  1979  . TUBAL LIGATION  1987    Family History  Problem Relation Age of Onset  . Lung cancer Mother        died age 70  . Liver cancer Mother   . Liver cancer Father   . Hypertension Brother   . Hypercholesterolemia Brother   . Gout Brother   . Breast cancer Neg Hx     SOCIAL HX: reviewed.    Current Outpatient Medications:  .  aspirin 81 MG tablet, Take 81 mg by mouth daily., Disp: , Rfl:  .  famotidine (PEPCID) 20 MG tablet, Take 20 mg by mouth daily., Disp: , Rfl:  .  febuxostat (ULORIC) 40 MG tablet, TAKE 1 TABLET BY MOUTH DAILY, Disp: 90 tablet, Rfl: 1 .  metoprolol tartrate (LOPRESSOR) 50 MG tablet, TAKE 1 TABLET (50 MG TOTAL) BY MOUTH 2 (TWO) TIMES DAILY., Disp: 180 tablet, Rfl: 3 .  omega-3 acid ethyl esters (LOVAZA) 1 g capsule, TAKE ONE CAPSULE BY MOUTH 4 TIMES A DAY, Disp: 360 capsule, Rfl: 1 .  rosuvastatin (CRESTOR) 5 MG tablet, TAKE 1 TABLET (5 MG TOTAL) BY MOUTH DAILY., Disp: 90 tablet, Rfl: 1 .  vitamin E 400 UNIT capsule, Take 400 Units by mouth 2 (two) times daily., Disp: , Rfl:   EXAM:  GENERAL: alert, oriented, appears well and in no acute distress  HEENT: atraumatic, conjunttiva clear, no obvious abnormalities on inspection of  external nose and ears  NECK: normal movements of the head and neck  LUNGS: on inspection no signs of respiratory distress, breathing rate appears normal, no obvious gross SOB, gasping or wheezing  CV: no obvious cyanosis  PSYCH/NEURO: pleasant and cooperative, no obvious depression or anxiety, speech and thought processing grossly intact  ASSESSMENT AND PLAN:  Discussed the following assessment and plan:  Abnormal liver function tests  Anemia, unspecified type  Chronic kidney disease (CKD), stage IV (severe) ([facility])  Gastroesophageal reflux disease, esophagitis presence not specified  Hyperglycemia  Essential hypertension  Hypertriglyceridemia  Metabolic syndrome  Abnormal liver  function tests Has been worked up by GI.  S/p liver biopsy.  Discussed diet and exercise.  Follow liver function tests.  Recent liver check wnl.    Anemia Follow cbc.   Chronic kidney disease (CKD), stage IV (severe) ([facility]) Has been followed by nephrology.  Recent GFR 18.  Has f/u with nephrology this week.    GERD (gastroesophageal reflux disease) With acid reflux.  Pepcid.    Hyperglycemia Low carb diet and exercise.  Follow met b and a1c.  With metabolic syndrome, weight stable (per her report) from last check, but increased compared to previous checks.  Discussed restarting saxenda.    Hypertension Blood pressure has been under good control.  Continue same medication regimen.  Follow pressures.  Follow metabolic panel.   Hypertriglyceridemia Persistent increased triglycerides.  Low carb diet and exercise.  Follow lipid panel and liver function tests.  On crestor.    Metabolic syndrome Off saxenda.  Weight as outlined.  Discussed diet and exercise.  Follow.      I discussed the assessment and treatment plan with the patient. The patient was provided an opportunity to ask questions and all were answered. The patient agreed with the plan and demonstrated an understanding of the instructions.   The patient was advised to call back or seek an in-person evaluation if the symptoms worsen or if the condition fails to improve as anticipated.    Einar Pheasant, MD

## 2018-08-11 ENCOUNTER — Encounter: Payer: Self-pay | Admitting: Internal Medicine

## 2018-08-11 NOTE — Assessment & Plan Note (Signed)
Persistent increased triglycerides.  Low carb diet and exercise.  Follow lipid panel and liver function tests.  On crestor.

## 2018-08-11 NOTE — Assessment & Plan Note (Signed)
Has been worked up by GI.  S/p liver biopsy.  Discussed diet and exercise.  Follow liver function tests.  Recent liver check wnl.

## 2018-08-11 NOTE — Assessment & Plan Note (Signed)
Blood pressure has been under good control.  Continue same medication regimen.  Follow pressures.  Follow metabolic panel.   

## 2018-08-11 NOTE — Assessment & Plan Note (Signed)
Has been followed by nephrology.  Recent GFR 18.  Has f/u with nephrology this week.

## 2018-08-11 NOTE — Assessment & Plan Note (Signed)
Follow cbc.  

## 2018-08-11 NOTE — Assessment & Plan Note (Addendum)
Low carb diet and exercise.  Follow met b and a1c.  With metabolic syndrome, weight stable (per her report) from last check, but increased compared to previous checks.  Discussed restarting saxenda.

## 2018-08-11 NOTE — Assessment & Plan Note (Signed)
With acid reflux.  Pepcid.

## 2018-08-11 NOTE — Assessment & Plan Note (Signed)
Off saxenda.  Weight as outlined.  Discussed diet and exercise.  Follow.

## 2018-08-13 ENCOUNTER — Telehealth: Payer: Self-pay | Admitting: Internal Medicine

## 2018-08-13 NOTE — Telephone Encounter (Signed)
-----   Message from De Hollingshead, [facility] sent at 08/13/2018 12:47 PM EDT ----- Regarding: RE: medication question Restarting Angel Collins would be a good option. Since she has Pharmacist, community, she can use the copay card available on the Saxenda website (says it reduced copayment to $25/month). https://www.novoc[employer].com/saxenda/savings-card.html  I think this is the best option for weight loss at this point. I would just recommend her to be careful about staying adequately hydrated, and let us know if she has bad nausea/vomiting that is dehydrating her, to prevent renal insult.   Catie ----- Message ----- From: Einar Pheasant, MD Sent: 08/12/2018  12:02 AM EDT To: De Hollingshead, RPH Subject: medication question                            Angel Collins has a history of stage IV CKD.  Has been on the transplant list previously.  She has metabolic syndrome.  Was previously on saxenda.  Has been off for a while.  Weight has increased.  She is interested in restarting saxenda (or other medication).  I would like your input.  Cost effective options, etc.    Thanks you for your help.   Einar Pheasant

## 2018-08-13 NOTE — Telephone Encounter (Signed)
My chart message sent to pt regarding saxenda.

## 2018-08-24 ENCOUNTER — Telehealth: Payer: Self-pay

## 2018-08-24 NOTE — Telephone Encounter (Signed)
Angel Collins, this pt has been on victoza previously for metabolic syndrome.  Had CKD.  Off transplant list now secondary to kidney function stable.  Had some intolerance initially when started previously.  What dose would you recommend starting.

## 2018-08-24 NOTE — Telephone Encounter (Signed)
Patient is agreeable to start saxenda. She would like rx sent to [facility] in [place]

## 2018-08-24 NOTE — Telephone Encounter (Signed)
I think sending for Kirke Shaggy is appropriate.   Recommend 0.6 mg daily x 1 week, then increase to 1.2 mg daily if tolerated from a GI perspective. Would recommend continuing 1.2 mg daily for 2-4 weeks; if tolerated, can increase to 1.8 mg daily. Goal would be to work up to 3 mg daily, but this can be done over several weeks.   D/t hx of GI intolerances with Victoza, could be more conservative and stay at each dose for longer.   Saxenda savings card available here: https://www.novocare.com/saxenda/savings-card.html  If eventually determined that she cannot tolerate GLP1 at a high enough dose to achieve weight loss, would recommend switching to weekly Ozempic. I've had patients tolerate weekly formulations better. However, would use Saxenda as first line, as a higher relative dose is possible.

## 2018-08-25 MED ORDER — LIRAGLUTIDE 18 MG/3ML ~~LOC~~ SOPN: 0.6000 mg | PEN_INJECTOR | Freq: Every day | SUBCUTANEOUS | 1 refills | Status: DC

## 2018-08-25 NOTE — Telephone Encounter (Signed)
Notify pt that I sent in rx for victoza (saxenda).  I am starting at a low dose.  Tell her to call us over the next 2-3 weeks and let us know how tolerating.  If tolerating from GI standpoint, we will increase the dose before next refill.

## 2018-08-27 NOTE — Telephone Encounter (Signed)
Patient aware.

## 2018-09-08 ENCOUNTER — Other Ambulatory Visit: Payer: Self-pay | Admitting: Internal Medicine

## 2018-10-10 ENCOUNTER — Encounter: Payer: Self-pay | Admitting: Internal Medicine

## 2018-10-11 ENCOUNTER — Other Ambulatory Visit: Payer: Self-pay

## 2018-10-11 MED ORDER — PEN NEEDLES 32G X 4 MM MISC: 1.0000 "application " | 6 refills | Status: DC

## 2018-10-11 NOTE — Telephone Encounter (Signed)
Have her remain on this dose for now.  Confirm she is not having any GI issues.  See how her weight is doing.  Also, contact her pharmacy and refill her needles - like she was using previously.    Thanks  Dr Nicki Reaper

## 2018-10-11 NOTE — Telephone Encounter (Signed)
Patient has lost about 5 pounds on victoza. No side effects at all. This is a fyi for you. Nothing further needed.

## 2018-11-22 ENCOUNTER — Other Ambulatory Visit: Payer: Self-pay | Admitting: Internal Medicine

## 2018-12-10 ENCOUNTER — Other Ambulatory Visit: Payer: Self-pay

## 2018-12-10 ENCOUNTER — Encounter: Payer: Self-pay | Admitting: Internal Medicine

## 2018-12-10 ENCOUNTER — Ambulatory Visit (INDEPENDENT_AMBULATORY_CARE_PROVIDER_SITE_OTHER): Payer: BC Managed Care – PPO | Admitting: Internal Medicine

## 2018-12-10 VITALS — BP 124/70 | HR 65 | Temp 98.2°F | Resp 16 | Ht 61.0 in | Wt 167.4 lb

## 2018-12-10 DIAGNOSIS — Z1231 Encounter for screening mammogram for malignant neoplasm of breast: Secondary | ICD-10-CM | POA: Diagnosis not present

## 2018-12-10 DIAGNOSIS — R945 Abnormal results of liver function studies: Secondary | ICD-10-CM | POA: Diagnosis not present

## 2018-12-10 DIAGNOSIS — I10 Essential (primary) hypertension: Secondary | ICD-10-CM

## 2018-12-10 DIAGNOSIS — R739 Hyperglycemia, unspecified: Secondary | ICD-10-CM

## 2018-12-10 DIAGNOSIS — E8881 Metabolic syndrome: Secondary | ICD-10-CM

## 2018-12-10 DIAGNOSIS — K219 Gastro-esophageal reflux disease without esophagitis: Secondary | ICD-10-CM

## 2018-12-10 DIAGNOSIS — Z1211 Encounter for screening for malignant neoplasm of colon: Secondary | ICD-10-CM

## 2018-12-10 DIAGNOSIS — Z0000 Encounter for general adult medical examination without abnormal findings: Secondary | ICD-10-CM

## 2018-12-10 DIAGNOSIS — D649 Anemia, unspecified: Secondary | ICD-10-CM

## 2018-12-10 DIAGNOSIS — Z23 Encounter for immunization: Secondary | ICD-10-CM | POA: Diagnosis not present

## 2018-12-10 DIAGNOSIS — E781 Pure hyperglyceridemia: Secondary | ICD-10-CM

## 2018-12-10 DIAGNOSIS — R7989 Other specified abnormal findings of blood chemistry: Secondary | ICD-10-CM

## 2018-12-10 DIAGNOSIS — N184 Chronic kidney disease, stage 4 (severe): Secondary | ICD-10-CM

## 2018-12-10 NOTE — Progress Notes (Signed)
Patient ID: Angel Collins, female   DOB: 11/17/1960, 58 y.o.   MRN: [id number]   Subjective:    Patient ID: Angel Collins, female    DOB: 01-29-1961, 58 y.o.   MRN: [id number]  HPI  Patient here for her physical exam.  She reports she is doing relatively well.  Feels good.  Trying to stay active.  No chest pain.  No sob.  No abdominal pain.  Bowels moving.  On victoza.  Tolerating.  No nausea or vomiting.  Saw nephrology last week.  Discussed referral to GI for colonoscopy/EGD.     Past Medical History:  Diagnosis Date  . Abnormal liver function    fatty liver  . 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 Gout   . Hypercholesterolemia   . Hyperglycemia   . Hypertension    Past Surgical History:  Procedure Laterality Date  . CESAREAN SECTION  1983  . ovarian cyst removed  1979  . TUBAL LIGATION  1987   Family History  Problem Relation Age of Onset  . Lung cancer Mother        died age 49  . Liver cancer Mother   . Liver cancer Father   . Hypertension Brother   . Hypercholesterolemia Brother   . Gout Brother   . Breast cancer Neg Hx    Social History   Socioeconomic History  . Marital status: Married    Spouse name: Not on file  . Number of children: 3  . Years of education: Not on file  . Highest education level: Not on file  Occupational History  . Not on file  Social Needs  . Financial resource strain: Not on file  . Food insecurity    Worry: Not on file    Inability: Not on file  . Transportation needs    Medical: Not on file    Non-medical: Not on file  Tobacco Use  . Smoking status: Never Smoker  . Smokeless tobacco: Never Used  Substance and Sexual Activity  . Alcohol use: Yes    Alcohol/week: 0.0 standard drinks    Comment: occasional  . Drug use: No  . Sexual activity: Not on file  Lifestyle  . Physical activity    Days per week: Not on file    Minutes per session: Not on file  . Stress: Not on file  Relationships  .  Social Herbalist on phone: Not on file    Gets together: Not on file    Attends religious service: Not on file    Active member of club or organization: Not on file    Attends meetings of clubs or organizations: Not on file    Relationship status: Not on file  Other Topics Concern  . Not on file  Social History Narrative  . Not on file    Outpatient Encounter Medications as of 12/10/2018  Medication Sig  . aspirin 81 MG tablet Take 81 mg by mouth daily.  . famotidine (PEPCID) 20 MG tablet Take 20 mg by mouth daily.  . febuxostat (ULORIC) 40 MG tablet TAKE 1 TABLET BY MOUTH DAILY  . Insulin Pen Needle (PEN NEEDLES) 32G X 4 MM MISC Inject 1 application into the skin as directed. With Victoza  . metoprolol tartrate (LOPRESSOR) 50 MG tablet TAKE 1 TABLET (50 MG TOTAL) BY MOUTH 2 (TWO) TIMES DAILY.  Marland Kitchen omega-3 acid ethyl esters (LOVAZA) 1 g capsule TAKE 1 CAPSULE  BY MOUTH FOUR TIMES A DAY  . rosuvastatin (CRESTOR) 5 MG tablet TAKE 1 TABLET BY MOUTH EVERY DAY  . VICTOZA 18 MG/3ML SOPN INJECT 0.6 MG UNDER THE SKIN ONCE DAILY  . vitamin E 400 UNIT capsule Take 400 Units by mouth 2 (two) times daily.   No facility-administered encounter medications on file as of 12/10/2018.     Review of Systems  Constitutional: Negative for appetite change and unexpected weight change.  HENT: Negative for congestion and sinus pressure.   Eyes: Negative for pain and visual disturbance.  Respiratory: Negative for cough, chest tightness and shortness of breath.   Cardiovascular: Negative for chest pain, palpitations and leg swelling.  Gastrointestinal: Negative for abdominal pain, diarrhea, nausea and vomiting.  Genitourinary: Negative for difficulty urinating and dysuria.  Musculoskeletal: Negative for joint swelling and myalgias.  Skin: Negative for color change and rash.  Neurological: Negative for dizziness, light-headedness and headaches.  Hematological: Negative for adenopathy. Does not  bruise/bleed easily.  Psychiatric/Behavioral: Negative for agitation and dysphoric mood.       Objective:    Physical Exam Constitutional:      General: She is not in acute distress.    Appearance: Normal appearance. She is well-developed.  HENT:     Right Ear: External ear normal.     Left Ear: External ear normal.  Eyes:     General: No scleral icterus.       Right eye: No discharge.        Left eye: No discharge.     Conjunctiva/sclera: Conjunctivae normal.  Neck:     Musculoskeletal: Neck supple. No muscular tenderness.     Thyroid: No thyromegaly.  Cardiovascular:     Rate and Rhythm: Normal rate and regular rhythm.  Pulmonary:     Effort: No tachypnea, accessory muscle usage or respiratory distress.     Breath sounds: Normal breath sounds. No decreased breath sounds or wheezing.  Chest:     Breasts:        Right: No inverted nipple, mass, nipple discharge or tenderness (no axillary adenopathy).        Left: No inverted nipple, mass, nipple discharge or tenderness (no axilarry adenopathy).  Abdominal:     General: Bowel sounds are normal.     Palpations: Abdomen is soft.     Tenderness: There is no abdominal tenderness.  Musculoskeletal:        General: No swelling or tenderness.  Lymphadenopathy:     Cervical: No cervical adenopathy.  Skin:    Findings: No erythema or rash.  Neurological:     Mental Status: She is alert and oriented to person, place, and time.  Psychiatric:        Mood and Affect: Mood normal.        Behavior: Behavior normal.     BP 124/70   Pulse 65   Temp 98.2 F (36.8 C)   Resp 16   Ht '5\' 1"'  (1.549 m)   Wt 167 lb 6.4 oz (75.9 kg)   SpO2 97%   BMI 31.63 kg/m  Wt Readings from Last 3 Encounters:  12/10/18 167 lb 6.4 oz (75.9 kg)  03/30/18 169 lb 12.8 oz (77 kg)  12/18/17 169 lb (76.7 kg)     Lab Results  Component Value Date   WBC 7.1 03/28/2018   HGB 12.2 03/28/2018   HCT 36.4 03/28/2018   PLT 189.0 03/28/2018   GLUCOSE  120 (H) 08/03/2018   CHOL 165 08/03/2018  TRIG 354.0 (H) 08/03/2018   HDL 46.70 08/03/2018   LDLDIRECT 51.0 08/03/2018   LDLCALC 61 06/24/2016   ALT 35 08/03/2018   AST 29 08/03/2018   NA 141 08/03/2018   K 4.5 08/03/2018   CL 108 08/03/2018   CREATININE 2.61 (H) 08/03/2018   BUN 50 (H) 08/03/2018   CO2 23 08/03/2018   TSH 3.97 03/28/2018   HGBA1C 6.1 08/03/2018    Mm Digital Screening Bilateral  Result Date: 07/12/2017 CLINICAL DATA:  Screening. EXAM: DIGITAL SCREENING BILATERAL MAMMOGRAM WITH CAD COMPARISON:  Previous exam(s). ACR Breast Density Category b: There are scattered areas of fibroglandular density. FINDINGS: There are no findings suspicious for malignancy. Images were processed with CAD. IMPRESSION: No mammographic evidence of malignancy. A result letter of this screening mammogram will be mailed directly to the patient. RECOMMENDATION: Screening mammogram in one year. (Code:SM-B-01Y) BI-RADS CATEGORY  1: Negative. Electronically Signed   By: Claudie Revering M.D.   On: 07/12/2017 12:20       Assessment & Plan:   Problem List Items Addressed This Visit    Abnormal liver function tests    Has been worked up by GI.  S/p liver biopsy.  Discussed diet and exercise.  Follow liver function tests.        Relevant Orders   Hepatic function panel   Anemia    Follow cbc.       Chronic kidney disease (CKD), stage IV (severe) ([place])    Followed by nephrology - Dr Alfonse Alpers.  Avoid antiinflammatories.  Follow metabolic panel.       GERD (gastroesophageal reflux disease)    With acid reflux.  Pepcid.        Relevant Orders   Ambulatory referral to Gastroenterology   Health care maintenance    Physical today 12/10/18.  PAP 06/10/16 - negative with negative HPV.  Colonoscopy 2010.  Due colonoscopy.  Refer to GI.       Hyperglycemia    Low carb diet and exercise.  Follow met b and a1c.       Relevant Orders   Hemoglobin A1c   Hypertension    Blood pressure under good  control.  Continue same medication regimen.  Follow pressures.  Follow metabolic panel.        Relevant Orders   TSH   Basic metabolic panel   Hypertriglyceridemia    Low carb diet and exercise.  Follow lipid panel.        Relevant Orders   Lipid panel   Metabolic syndrome    On victoza.  Low carb diet and exercise.  Discussed weight loss.  Follow.         Other Visit Diagnoses    Colon cancer screening    -  Primary   Relevant Orders   Ambulatory referral to Gastroenterology   Visit for screening mammogram       Relevant Orders   MM 3D SCREEN BREAST BILATERAL   Need for immunization against influenza       Relevant Orders   Flu Vaccine QUAD 36+ mos IM (Completed)       Einar Pheasant, MD

## 2018-12-10 NOTE — Assessment & Plan Note (Addendum)
Physical today 12/10/18.  PAP 06/10/16 - negative with negative HPV.  Colonoscopy 2010.  Due colonoscopy.  Refer to GI.

## 2018-12-10 NOTE — Patient Instructions (Signed)
Increase pepcid to twice a day.  Take one 30 min before breakfast and one 30 min before your evening meal.

## 2018-12-15 ENCOUNTER — Encounter: Payer: Self-pay | Admitting: Internal Medicine

## 2018-12-15 NOTE — Assessment & Plan Note (Signed)
On victoza.  Low carb diet and exercise.  Discussed weight loss.  Follow.

## 2018-12-15 NOTE — Assessment & Plan Note (Signed)
Follow cbc.  

## 2018-12-15 NOTE — Assessment & Plan Note (Signed)
Has been worked up by GI.  S/p liver biopsy.  Discussed diet and exercise.  Follow liver function tests.   

## 2018-12-15 NOTE — Assessment & Plan Note (Signed)
With acid reflux.  Pepcid.   

## 2018-12-15 NOTE — Assessment & Plan Note (Signed)
Followed by nephrology - Dr Mottl.  Avoid antiinflammatories.  Follow metabolic panel.  

## 2018-12-15 NOTE — Assessment & Plan Note (Signed)
Low carb diet and exercise.  Follow lipid panel.  

## 2018-12-15 NOTE — Assessment & Plan Note (Signed)
Low carb diet and exercise.  Follow met b and a1c.  

## 2018-12-15 NOTE — Assessment & Plan Note (Signed)
Blood pressure under good control.  Continue same medication regimen.  Follow pressures.  Follow metabolic panel.   

## 2018-12-27 ENCOUNTER — Other Ambulatory Visit: Payer: Self-pay | Admitting: Internal Medicine

## 2018-12-27 ENCOUNTER — Other Ambulatory Visit (INDEPENDENT_AMBULATORY_CARE_PROVIDER_SITE_OTHER): Payer: BC Managed Care – PPO

## 2018-12-27 ENCOUNTER — Other Ambulatory Visit: Payer: Self-pay

## 2018-12-27 DIAGNOSIS — R7989 Other specified abnormal findings of blood chemistry: Secondary | ICD-10-CM

## 2018-12-27 DIAGNOSIS — E781 Pure hyperglyceridemia: Secondary | ICD-10-CM | POA: Diagnosis not present

## 2018-12-27 DIAGNOSIS — I10 Essential (primary) hypertension: Secondary | ICD-10-CM | POA: Diagnosis not present

## 2018-12-27 DIAGNOSIS — R739 Hyperglycemia, unspecified: Secondary | ICD-10-CM

## 2018-12-27 DIAGNOSIS — R945 Abnormal results of liver function studies: Secondary | ICD-10-CM

## 2018-12-27 LAB — LIPID PANEL
Cholesterol: 155 mg/dL (ref 0–200)
HDL: 47.6 mg/dL (ref >39.00)
NonHDL: 107.8
Total CHOL/HDL Ratio: 3
Triglycerides: 267 mg/dL — ABNORMAL HIGH (ref 0.0–149.0)
VLDL: 53.4 mg/dL — ABNORMAL HIGH (ref 0.0–40.0)

## 2018-12-27 LAB — HEPATIC FUNCTION PANEL
ALT: 38 U/L — ABNORMAL HIGH (ref 0–35)
AST: 27 U/L (ref 0–37)
Albumin: 4.4 g/dL (ref 3.5–5.2)
Alkaline Phosphatase: 85 U/L (ref 39–117)
Bilirubin, Direct: 0 mg/dL (ref 0.0–0.3)
Total Bilirubin: 0.5 mg/dL (ref 0.2–1.2)
Total Protein: 7.5 g/dL (ref 6.0–8.3)

## 2018-12-27 LAB — LDL CHOLESTEROL, DIRECT: Direct LDL: 59 mg/dL

## 2018-12-27 LAB — HEMOGLOBIN A1C: Hgb A1c MFr Bld: 6.1 % (ref 4.6–6.5)

## 2018-12-27 LAB — BASIC METABOLIC PANEL
BUN: 45 mg/dL — ABNORMAL HIGH (ref 6–23)
CO2: 23 mEq/L (ref 19–32)
Calcium: 10.2 mg/dL (ref 8.4–10.5)
Chloride: 105 mEq/L (ref 96–112)
Creatinine, Ser: 2.52 mg/dL — ABNORMAL HIGH (ref 0.40–1.20)
GFR: 19.47 mL/min — ABNORMAL LOW (ref >60.00)
Glucose, Bld: 110 mg/dL — ABNORMAL HIGH (ref 70–99)
Potassium: 4.6 mEq/L (ref 3.5–5.1)
Sodium: 138 mEq/L (ref 135–145)

## 2018-12-27 LAB — TSH: TSH: 3.97 u[IU]/mL (ref 0.35–4.50)

## 2018-12-27 NOTE — Progress Notes (Signed)
Order placed for f/u lab.   

## 2019-01-01 ENCOUNTER — Encounter: Payer: Self-pay | Admitting: *Deleted

## 2019-01-11 ENCOUNTER — Ambulatory Visit
Admission: RE | Admit: 2019-01-11 | Discharge: 2019-01-11 | Disposition: A | Discharge status: Home / Self Care | Payer: BC Managed Care – PPO | Source: Ambulatory Visit | Attending: Internal Medicine | Admitting: Internal Medicine

## 2019-01-11 DIAGNOSIS — Z1231 Encounter for screening mammogram for malignant neoplasm of breast: Secondary | ICD-10-CM | POA: Diagnosis present

## 2019-01-13 ENCOUNTER — Other Ambulatory Visit: Payer: Self-pay | Admitting: Internal Medicine

## 2019-01-16 ENCOUNTER — Encounter: Payer: Self-pay | Admitting: Internal Medicine

## 2019-01-29 ENCOUNTER — Other Ambulatory Visit (INDEPENDENT_AMBULATORY_CARE_PROVIDER_SITE_OTHER): Payer: BC Managed Care – PPO

## 2019-01-29 ENCOUNTER — Other Ambulatory Visit: Payer: Self-pay

## 2019-01-29 DIAGNOSIS — R945 Abnormal results of liver function studies: Secondary | ICD-10-CM

## 2019-01-29 DIAGNOSIS — R7989 Other specified abnormal findings of blood chemistry: Secondary | ICD-10-CM

## 2019-01-29 LAB — HEPATIC FUNCTION PANEL
ALT: 35 U/L (ref 0–35)
AST: 26 U/L (ref 0–37)
Albumin: 4.6 g/dL (ref 3.5–5.2)
Alkaline Phosphatase: 94 U/L (ref 39–117)
Bilirubin, Direct: 0.1 mg/dL (ref 0.0–0.3)
Total Bilirubin: 0.6 mg/dL (ref 0.2–1.2)
Total Protein: 7.4 g/dL (ref 6.0–8.3)

## 2019-01-30 ENCOUNTER — Encounter: Payer: Self-pay | Admitting: Internal Medicine

## 2019-03-19 ENCOUNTER — Other Ambulatory Visit: Payer: Self-pay | Admitting: Family Medicine

## 2019-03-25 ENCOUNTER — Encounter: Payer: Self-pay | Admitting: Internal Medicine

## 2019-04-01 ENCOUNTER — Other Ambulatory Visit: Payer: BC Managed Care – PPO

## 2019-04-24 ENCOUNTER — Ambulatory Visit (INDEPENDENT_AMBULATORY_CARE_PROVIDER_SITE_OTHER): Payer: BC Managed Care – PPO | Admitting: Internal Medicine

## 2019-04-24 DIAGNOSIS — I10 Essential (primary) hypertension: Secondary | ICD-10-CM

## 2019-04-24 DIAGNOSIS — K219 Gastro-esophageal reflux disease without esophagitis: Secondary | ICD-10-CM | POA: Diagnosis not present

## 2019-04-24 DIAGNOSIS — E8881 Metabolic syndrome: Secondary | ICD-10-CM

## 2019-04-24 DIAGNOSIS — N184 Chronic kidney disease, stage 4 (severe): Secondary | ICD-10-CM

## 2019-04-24 DIAGNOSIS — D649 Anemia, unspecified: Secondary | ICD-10-CM

## 2019-04-24 DIAGNOSIS — E781 Pure hyperglyceridemia: Secondary | ICD-10-CM

## 2019-04-24 DIAGNOSIS — R945 Abnormal results of liver function studies: Secondary | ICD-10-CM

## 2019-04-24 DIAGNOSIS — R7989 Other specified abnormal findings of blood chemistry: Secondary | ICD-10-CM

## 2019-04-24 DIAGNOSIS — R739 Hyperglycemia, unspecified: Secondary | ICD-10-CM | POA: Diagnosis not present

## 2019-04-24 NOTE — Progress Notes (Signed)
Patient ID: Angel Collins, female   DOB: [birth year], 59 y.o.   MRN: [id number]   Virtual Visit via video Note  This visit type was conducted due to national recommendations for restrictions regarding the COVID-19 pandemic (e.g. social distancing).  This format is felt to be most appropriate for this patient at this time.  All issues noted in this document were discussed and addressed.  No physical exam was performed (except for noted visual exam findings with Video Visits).   I connected with Angel Collins by a video enabled telemedicine application and verified that I am speaking with the correct person using two identifiers. Location patient: home Location provider: work  Persons participating in the virtual visit: patient, provider  The limitations, risks, security and privacy concerns of performing an evaluation and management service by video and the availability of in person appointments have been discussed.   The patient expressed understanding and agreed to proceed.   Reason for visit: scheduled follow up   HPI: She reports she is doing relatively well.  Diagnosed with covid 03/26/19.  Had sinus symptoms.  No sob.  No fever.  Light cough.  Loss of taste more than loss of smell.  Doing well now.  No increased congestion or sob.  No chest pain reported.  No abdominal pain.  Bowels stable.  Does report some reflux.  Taking pepcid.  Still having break through symptoms.  Taking TUMS.  Off PPI secondary to kidney disease.  Saw GI. Note reviewed.  GI to d/w nephrology regarding further treatment.  Has f/u with Dr Suezanne Cheshire next month.  Trying to watch her diet.  Tries to stay active.  On lovaza.  Insurance not going to cover.  Agreeable to meet with Catie to discuss.     ROS: See pertinent positives and negatives per HPI.  Past Medical History:  Diagnosis Date  . Abnormal liver function    fatty liver  . 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 Gout   .  Hypercholesterolemia   . Hyperglycemia   . Hypertension     Past Surgical History:  Procedure Laterality Date  . CESAREAN SECTION  1983  . ovarian cyst removed  1979  . TUBAL LIGATION  1987    Family History  Problem Relation Age of Onset  . Lung cancer Mother        died age 76  . Liver cancer Mother   . Liver cancer Father   . Hypertension Brother   . Hypercholesterolemia Brother   . Gout Brother   . Breast cancer Neg Hx     SOCIAL HX: reviewed.    Current Outpatient Medications:  .  aspirin 81 MG tablet, Take 81 mg by mouth daily., Disp: , Rfl:  .  famotidine (PEPCID) 20 MG tablet, Take 20 mg by mouth daily., Disp: , Rfl:  .  febuxostat (ULORIC) 40 MG tablet, TAKE 1 TABLET BY MOUTH EVERY DAY, Disp: 90 tablet, Rfl: 1 .  Insulin Pen Needle (PEN NEEDLES) 32G X 4 MM MISC, Inject 1 application into the skin as directed. With Victoza, Disp: 100 each, Rfl: 6 .  metoprolol tartrate (LOPRESSOR) 50 MG tablet, TAKE 1 TABLET (50 MG TOTAL) BY MOUTH 2 (TWO) TIMES DAILY., Disp: 180 tablet, Rfl: 3 .  omega-3 acid ethyl esters (LOVAZA) 1 g capsule, TAKE 1 CAPSULE BY MOUTH FOUR TIMES A DAY, Disp: 360 capsule, Rfl: 1 .  rosuvastatin (CRESTOR) 5 MG tablet, TAKE 1 TABLET BY  MOUTH EVERY DAY, Disp: 90 tablet, Rfl: 1 .  VICTOZA 18 MG/3ML SOPN, INJECT 0.6 MG UNDER THE SKIN ONCE DAILY, Disp: 6 pen, Rfl: 1 .  vitamin E 400 UNIT capsule, Take 400 Units by mouth 2 (two) times daily., Disp: , Rfl:   EXAM:  GENERAL: alert, oriented, appears well and in no acute distress  HEENT: atraumatic, conjunttiva clear, no obvious abnormalities on inspection of external nose and ears  NECK: normal movements of the head and neck  LUNGS: on inspection no signs of respiratory distress, breathing rate appears normal, no obvious gross SOB, gasping or wheezing  CV: no obvious cyanosis  PSYCH/NEURO: pleasant and cooperative, no obvious depression or anxiety, speech and thought processing grossly  intact  ASSESSMENT AND PLAN:  Discussed the following assessment and plan:  Abnormal liver function tests Has been worked up by GI.  S/p liver biopsy.  Diet, exercise and weight loss.  Follow liver function tests.    Anemia S/p EGD and colonoscopy - 01/2019.  Recommended f/u in 3 years.  Follow cbc.   Chronic kidney disease (CKD), stage IV (severe) ([place]) Followed by nephrology - Dr Alfonse Alpers.  Avoid antiinflammatories.  Follow metabolic panel.   GERD (gastroesophageal reflux disease) On pepcid.  Having break through symptoms.  Taking TUMS.  Reviewed GI note.  D/w nephrology - question of PPI use.    Hyperglycemia Low carb diet and exercise.  Follow met b and a1c.   Hypertension Blood pressure under good control.  Continue same medication regimen.  Follow pressures.  Follow metabolic panel.    Hypertriglyceridemia Low carb diet and exercise.  On lovaza.  Triglycerides better on lovaza.  Previous triglycerides >500.  Will place CCM referral - for medication management - help with cost.    Metabolic syndrome On victoza. Low carb diet and exercise.  Follow.     Orders Placed This Encounter  Procedures  . Hemoglobin A1c    Standing Status:   Future    Standing Expiration Date:   04/23/2020  . Hepatic function panel    Standing Status:   Future    Standing Expiration Date:   04/23/2020  . CBC with Differential/Platelet    Standing Status:   Future    Standing Expiration Date:   04/23/2020  . Lipid panel    Standing Status:   Future    Standing Expiration Date:   04/23/2020  . Basic metabolic panel (future)    Standing Status:   Future    Standing Expiration Date:   04/23/2020  . Ambulatory referral to Chronic Care Management Services    Referral Priority:   Routine    Referral Type:   Consultation    Referral Reason:   Care Coordination    Number of Visits Requested:   1     I discussed the assessment and treatment plan with the patient. The patient was provided an  opportunity to ask questions and all were answered. The patient agreed with the plan and demonstrated an understanding of the instructions.   The patient was advised to call back or seek an in-person evaluation if the symptoms worsen or if the condition fails to improve as anticipated.   Einar Pheasant, MD

## 2019-04-28 ENCOUNTER — Encounter: Payer: Self-pay | Admitting: Internal Medicine

## 2019-04-28 NOTE — Assessment & Plan Note (Signed)
On victoza. Low carb diet and exercise.  Follow.

## 2019-04-28 NOTE — Assessment & Plan Note (Signed)
Followed by nephrology - Dr Alfonse Alpers.  Avoid antiinflammatories.  Follow metabolic panel.

## 2019-04-28 NOTE — Assessment & Plan Note (Signed)
Has been worked up by GI.  S/p liver biopsy.  Diet, exercise and weight loss.  Follow liver function tests.

## 2019-04-28 NOTE — Assessment & Plan Note (Signed)
Low carb diet and exercise.  Follow met b and a1c.  

## 2019-04-28 NOTE — Assessment & Plan Note (Signed)
Blood pressure under good control.  Continue same medication regimen.  Follow pressures.  Follow metabolic panel.   

## 2019-04-28 NOTE — Assessment & Plan Note (Signed)
Low carb diet and exercise.  On lovaza.  Triglycerides better on lovaza.  Previous triglycerides >500.  Will place CCM referral - for medication management - help with cost.

## 2019-04-28 NOTE — Assessment & Plan Note (Signed)
On pepcid.  Having break through symptoms.  Taking TUMS.  Reviewed GI note.  D/w nephrology - question of PPI use.

## 2019-04-28 NOTE — Assessment & Plan Note (Signed)
S/p EGD and colonoscopy.  - 01/2019.  Recommended f/u in 3 years.  Follow cbc.  

## 2019-04-29 ENCOUNTER — Telehealth: Payer: Self-pay | Admitting: Internal Medicine

## 2019-04-29 NOTE — Chronic Care Management (AMB) (Signed)
  Care Management   Note  04/29/2019 Name: Angel Collins MRN: [id number] DOB: 1960/10/12  Angel Collins is a 59 y.o. year old female who is a primary care patient of Einar Pheasant, MD. I reached out to [name] by phone today in response to a referral sent by [name]'s health plan.    Ms. Kaercher was given information about care management services today including:  1. Care management services include personalized support from designated clinical staff supervised by her physician, including individualized plan of care and coordination with other care providers 2. 24/7 contact phone numbers for assistance for urgent and routine care needs. 3. The patient may stop care management services at any time by phone call to the office staff.  Patient agreed to services and verbal consent obtained.   Follow up plan: Telephone appointment with care management team member scheduled for:05/30/2019  Glenna Durand, LPN Health Advisor, [facility] ??[email or web address] ??[phone]

## 2019-05-07 ENCOUNTER — Other Ambulatory Visit: Payer: Self-pay

## 2019-05-09 ENCOUNTER — Other Ambulatory Visit (INDEPENDENT_AMBULATORY_CARE_PROVIDER_SITE_OTHER): Payer: BC Managed Care – PPO

## 2019-05-09 ENCOUNTER — Other Ambulatory Visit: Payer: Self-pay

## 2019-05-09 DIAGNOSIS — R739 Hyperglycemia, unspecified: Secondary | ICD-10-CM

## 2019-05-09 DIAGNOSIS — E781 Pure hyperglyceridemia: Secondary | ICD-10-CM | POA: Diagnosis not present

## 2019-05-09 DIAGNOSIS — N184 Chronic kidney disease, stage 4 (severe): Secondary | ICD-10-CM | POA: Diagnosis not present

## 2019-05-09 DIAGNOSIS — I10 Essential (primary) hypertension: Secondary | ICD-10-CM

## 2019-05-09 LAB — BASIC METABOLIC PANEL
BUN: 47 mg/dL — ABNORMAL HIGH (ref 6–23)
CO2: 26 mEq/L (ref 19–32)
Calcium: 9.7 mg/dL (ref 8.4–10.5)
Chloride: 104 mEq/L (ref 96–112)
Creatinine, Ser: 2.72 mg/dL — ABNORMAL HIGH (ref 0.40–1.20)
GFR: 17.8 mL/min — ABNORMAL LOW (ref >60.00)
Glucose, Bld: 107 mg/dL — ABNORMAL HIGH (ref 70–99)
Potassium: 4.5 mEq/L (ref 3.5–5.1)
Sodium: 138 mEq/L (ref 135–145)

## 2019-05-09 LAB — CBC WITH DIFFERENTIAL/PLATELET
Basophils Absolute: 0 10*3/uL (ref 0.0–0.1)
Basophils Relative: 0.5 % (ref 0.0–3.0)
Eosinophils Absolute: 0.2 10*3/uL (ref 0.0–0.7)
Eosinophils Relative: 2.8 % (ref 0.0–5.0)
HCT: 37.4 % (ref 36.0–46.0)
Hemoglobin: 12.3 g/dL (ref 12.0–15.0)
Lymphocytes Relative: 44 % (ref 12.0–46.0)
Lymphs Abs: 3.4 10*3/uL (ref 0.7–4.0)
MCHC: 32.9 g/dL (ref 30.0–36.0)
MCV: 96.4 fl (ref 78.0–100.0)
Monocytes Absolute: 0.7 10*3/uL (ref 0.1–1.0)
Monocytes Relative: 9.2 % (ref 3.0–12.0)
Neutro Abs: 3.4 10*3/uL (ref 1.4–7.7)
Neutrophils Relative %: 43.5 % (ref 43.0–77.0)
Platelets: 192 10*3/uL (ref 150.0–400.0)
RBC: 3.88 Mil/uL (ref 3.87–5.11)
RDW: 13.3 % (ref 11.5–15.5)
WBC: 7.8 10*3/uL (ref 4.0–10.5)

## 2019-05-09 LAB — LIPID PANEL
Cholesterol: 162 mg/dL (ref 0–200)
HDL: 49.3 mg/dL (ref >39.00)
NonHDL: 112.69
Total CHOL/HDL Ratio: 3
Triglycerides: 282 mg/dL — ABNORMAL HIGH (ref 0.0–149.0)
VLDL: 56.4 mg/dL — ABNORMAL HIGH (ref 0.0–40.0)

## 2019-05-09 LAB — HEPATIC FUNCTION PANEL
ALT: 28 U/L (ref 0–35)
AST: 25 U/L (ref 0–37)
Albumin: 4.4 g/dL (ref 3.5–5.2)
Alkaline Phosphatase: 83 U/L (ref 39–117)
Bilirubin, Direct: 0.1 mg/dL (ref 0.0–0.3)
Total Bilirubin: 0.6 mg/dL (ref 0.2–1.2)
Total Protein: 7.4 g/dL (ref 6.0–8.3)

## 2019-05-09 LAB — HEMOGLOBIN A1C: Hgb A1c MFr Bld: 5.9 % (ref 4.6–6.5)

## 2019-05-09 LAB — LDL CHOLESTEROL, DIRECT: Direct LDL: 65 mg/dL

## 2019-05-11 ENCOUNTER — Encounter: Payer: Self-pay | Admitting: Internal Medicine

## 2019-05-19 ENCOUNTER — Other Ambulatory Visit: Payer: Self-pay | Admitting: Internal Medicine

## 2019-05-30 ENCOUNTER — Ambulatory Visit: Payer: BC Managed Care – PPO | Admitting: Pharmacist

## 2019-05-30 ENCOUNTER — Encounter: Payer: Self-pay | Admitting: Pharmacist

## 2019-05-30 DIAGNOSIS — N184 Chronic kidney disease, stage 4 (severe): Secondary | ICD-10-CM

## 2019-05-30 DIAGNOSIS — R739 Hyperglycemia, unspecified: Secondary | ICD-10-CM

## 2019-05-30 DIAGNOSIS — E781 Pure hyperglyceridemia: Secondary | ICD-10-CM

## 2019-05-30 NOTE — Chronic Care Management (AMB) (Signed)
Chronic Care Management   Note  05/30/2019 Name: Angel Collins MRN: [id number] DOB: Nov 16, 1960   Subjective:  Angel Collins is a 59 y.o. year old female who is a primary care patient of Einar Pheasant, MD. The CCM team was consulted for assistance with chronic disease management and care coordination needs.    Contacted patient for medication management review.   Review of patient status, including review of consultants reports, laboratory and other test data, was performed as part of comprehensive evaluation and provision of chronic care management services.   SDOH (Social Determinants of Health) assessments and interventions performed:  SDOH Interventions     Most Recent Value  SDOH Interventions  SDOH Interventions for the Following Domains  Financial Strain  Financial Strain Interventions  Other (Comment) [Collaborated w/ office staff on PA,  copay card]       Objective:  Lab Results  Component Value Date   CREATININE 2.72 (H) 05/09/2019   CREATININE 2.52 (H) 12/27/2018   CREATININE 2.61 (H) 08/03/2018    Lab Results  Component Value Date   HGBA1C 5.9 05/09/2019       Component Value Date/Time   CHOL 162 05/09/2019 0958   TRIG 282.0 (H) 05/09/2019 0958   HDL 49.30 05/09/2019 0958   CHOLHDL 3 05/09/2019 0958   VLDL 56.4 (H) 05/09/2019 0958   LDLCALC 61 06/24/2016 0833   LDLDIRECT 65.0 05/09/2019 0958    Clinical ASCVD: No  The 10-year ASCVD risk score Mikey Bussing DC Jr., et al., 2013) is: 5%   Values used to calculate the score:     Age: 25 years     Sex: Female     Is Non-Hispanic African American: No     Diabetic: No     Tobacco smoker: No     Systolic Blood Pressure: 458 mmHg     Is BP treated: Yes     HDL Cholesterol: 49.3 mg/dL     Total Cholesterol: 162 mg/dL    BP Readings from Last 3 Encounters:  12/10/18 124/70  03/30/18 118/74  12/18/17 122/70    Allergies  Allergen Reactions  . Norvasc [Amlodipine Besylate]     Intolerance   . Sulfa  Antibiotics     Medications Reviewed Today    Reviewed by De Hollingshead, [facility] (Pharmacist) on 05/30/19 at 1318  Med List Status: <None>  Medication Order Taking? Sig Documenting Provider Last Dose Status Informant  aspirin 81 MG tablet [id number] Yes Take 81 mg by mouth daily. [provider] Taking Active   famotidine (PEPCID) 20 MG tablet [id number] Yes Take 20 mg by mouth 2 (two) times daily.  [provider] Taking Active   febuxostat (ULORIC) 40 MG tablet [id number] Yes TAKE 1 TABLET BY MOUTH EVERY DAY Einar Pheasant, MD Taking Active   furosemide (LASIX) 40 MG tablet [id number] Yes Take 40 mg by mouth daily. [provider] Taking Active   Insulin Pen Needle (PEN NEEDLES) 32G X 4 MM MISC [id number] Yes Inject 1 application into the skin as directed. With Tera Partridge, Randell Patient, MD Taking Active   metoprolol tartrate (LOPRESSOR) 50 MG tablet [id number] Yes TAKE 1 TABLET (50 MG TOTAL) BY MOUTH 2 (TWO) TIMES DAILY. Einar Pheasant, MD Taking Active   omega-3 acid ethyl esters (LOVAZA) 1 g capsule [id number] No TAKE 1 CAPSULE BY MOUTH FOUR TIMES A DAY  Patient not taking: Reported on 05/30/2019   Leone Haven, MD Not Taking Active   rosuvastatin (CRESTOR) 5 MG tablet  [id number] Yes TAKE 1 TABLET BY MOUTH EVERY DAY Einar Pheasant, MD Taking Active   VICTOZA 18 MG/3ML SOPN [id number] Yes INJECT 0.6 MG UNDER THE SKIN ONCE DAILY  Patient taking differently: 1.2 mg.    Leone Haven, MD Taking Active   vitamin E 400 UNIT capsule [id number] Yes Take 400 Units by mouth 2 (two) times daily. [provider] Taking Active            Assessment:   Goals Addressed            This Visit's Progress     Patient Stated   . PharmD "I cannot afford my medication" (pt-stated)       CARE PLAN ENTRY (see longtitudinal plan of care for additional care plan information)  Current Barriers:  . Polypharmacy; complex patient with multiple comorbidities  including CKD IV, hypertriglyceridemia, HTN, gout . Self-manages medications . Most recent eGFR: ~17 mL/min o Hypertriglyceridemia: Lovaza 4 g daily. Reports that medication is now not covered by her insurance, previously was $15 o CKD: furosemide 20 mg daily (swelling); febuxostat 40 mg daily (gout prevention) o HTN: metoprolol 50 mg BID o ASCVD risk reduction: rosuvastatin 5 mg daily, ASA 81 mg daily o Metabolic syndrome/weight loss: Victoza 1.2 mg daily; was increased last week by nephrologist, notes that this medication is also expensive  o GERD: famotidine 20 mg BID  Pharmacist Clinical Goal(s):  Marland Kitchen Over the next 90 days, patient will work with PharmD and provider towards optimized medication management  Interventions: . Comprehensive medication review performed; medication list updated in electronic medical record . [place]ed [place]. Omega 3 fatty acids appears to be covered, but requires PA. Febuxostat is covered, but Tier 2.  . Contacted [facility] pharmacy. They confirmed that a PA is required, and will send that to the office. Will collaborate w/ CMA on completion of this . Will send patient a MyChart message with link to [place] card website and update on PA  Patient Self Care Activities:  . Patient will take medications as prescribed  Initial goal documentation        Plan: - Will collaborate w/ LPN on completion of PA  Catie Darnelle Maffucci, PharmD, [place], [place] Pharmacist [place] [phone]

## 2019-05-30 NOTE — Patient Instructions (Signed)
Visit Information  Goals Addressed            This Visit's Progress     Patient Stated   . PharmD "I cannot afford my medication" (pt-stated)       CARE PLAN ENTRY (see longtitudinal plan of care for additional care plan information)  Current Barriers:  . Polypharmacy; complex patient with multiple comorbidities including CKD IV, hypertriglyceridemia, HTN, gout . Self-manages medications . Most recent eGFR: ~17 mL/min o Hypertriglyceridemia: Lovaza 4 g daily. Reports that medication is now not covered by her insurance, previously was $15 o CKD: furosemide 20 mg daily (swelling); febuxostat 40 mg daily (gout prevention) o HTN: metoprolol 50 mg BID o ASCVD risk reduction: rosuvastatin 5 mg daily, ASA 81 mg daily o Metabolic syndrome/weight loss: Victoza 1.2 mg daily; was increased last week by nephrologist, notes that this medication is also expensive  o GERD: famotidine 20 mg BID  Pharmacist Clinical Goal(s):  Marland Kitchen Over the next 90 days, patient will work with PharmD and provider towards optimized medication management  Interventions: . Comprehensive medication review performed; medication list updated in electronic medical record . Reviewed [place] formul[place]. Omega 3 fatty acids appears to be covered, but requires PA. Febuxostat is covered, but Tier 2.  . Contacted [facility] pharmacy. They confirmed that a PA is required, and will send that to the office. Will collaborate w/ CMA on completion of this . Will send patient a MyChart message with link to [place] card website and update on PA  Patient Self Care Activities:  . Patient will take medications as prescribed  Initial goal documentation        Patient verbalizes understanding of instructions provided today.   Plan: - Will collaborate w/ LPN on completion of PA  Catie Darnelle Maffucci, PharmD, [place], [place] Pharmacist [place] [phone]

## 2019-05-31 NOTE — Progress Notes (Signed)
Reviewed information.  Agree with assessment and plan.    Dr Abigail Marsiglia 

## 2019-08-26 ENCOUNTER — Encounter: Payer: Self-pay | Admitting: Internal Medicine

## 2019-08-26 ENCOUNTER — Ambulatory Visit (INDEPENDENT_AMBULATORY_CARE_PROVIDER_SITE_OTHER): Payer: BC Managed Care – PPO

## 2019-08-26 ENCOUNTER — Other Ambulatory Visit: Payer: Self-pay

## 2019-08-26 ENCOUNTER — Ambulatory Visit (INDEPENDENT_AMBULATORY_CARE_PROVIDER_SITE_OTHER): Payer: BC Managed Care – PPO | Admitting: Internal Medicine

## 2019-08-26 VITALS — BP 132/76 | HR 82 | Temp 97.7°F | Resp 16 | Ht 61.0 in | Wt 168.0 lb

## 2019-08-26 DIAGNOSIS — E8881 Metabolic syndrome: Secondary | ICD-10-CM

## 2019-08-26 DIAGNOSIS — M5442 Lumbago with sciatica, left side: Secondary | ICD-10-CM | POA: Diagnosis not present

## 2019-08-26 DIAGNOSIS — M5441 Lumbago with sciatica, right side: Secondary | ICD-10-CM

## 2019-08-26 DIAGNOSIS — D649 Anemia, unspecified: Secondary | ICD-10-CM

## 2019-08-26 DIAGNOSIS — E781 Pure hyperglyceridemia: Secondary | ICD-10-CM

## 2019-08-26 DIAGNOSIS — K219 Gastro-esophageal reflux disease without esophagitis: Secondary | ICD-10-CM

## 2019-08-26 DIAGNOSIS — M545 Low back pain, unspecified: Secondary | ICD-10-CM | POA: Insufficient documentation

## 2019-08-26 DIAGNOSIS — R739 Hyperglycemia, unspecified: Secondary | ICD-10-CM

## 2019-08-26 DIAGNOSIS — I10 Essential (primary) hypertension: Secondary | ICD-10-CM

## 2019-08-26 DIAGNOSIS — N184 Chronic kidney disease, stage 4 (severe): Secondary | ICD-10-CM

## 2019-08-26 DIAGNOSIS — K760 Fatty (change of) liver, not elsewhere classified: Secondary | ICD-10-CM

## 2019-08-26 LAB — BASIC METABOLIC PANEL
BUN: 49 mg/dL — ABNORMAL HIGH (ref 6–23)
CO2: 25 mEq/L (ref 19–32)
Calcium: 9.4 mg/dL (ref 8.4–10.5)
Chloride: 100 mEq/L (ref 96–112)
Creatinine, Ser: 2.49 mg/dL — ABNORMAL HIGH (ref 0.40–1.20)
GFR: 19.7 mL/min — ABNORMAL LOW (ref >60.00)
Glucose, Bld: 106 mg/dL — ABNORMAL HIGH (ref 70–99)
Potassium: 3.9 mEq/L (ref 3.5–5.1)
Sodium: 136 mEq/L (ref 135–145)

## 2019-08-26 LAB — LIPID PANEL
Cholesterol: 155 mg/dL (ref 0–200)
HDL: 48.7 mg/dL (ref >39.00)
NonHDL: 106.48
Total CHOL/HDL Ratio: 3
Triglycerides: 342 mg/dL — ABNORMAL HIGH (ref 0.0–149.0)
VLDL: 68.4 mg/dL — ABNORMAL HIGH (ref 0.0–40.0)

## 2019-08-26 LAB — HEMOGLOBIN A1C: Hgb A1c MFr Bld: 5.8 % (ref 4.6–6.5)

## 2019-08-26 LAB — HEPATIC FUNCTION PANEL
ALT: 31 U/L (ref 0–35)
AST: 26 U/L (ref 0–37)
Albumin: 4.3 g/dL (ref 3.5–5.2)
Alkaline Phosphatase: 116 U/L (ref 39–117)
Bilirubin, Direct: 0.1 mg/dL (ref 0.0–0.3)
Total Bilirubin: 0.6 mg/dL (ref 0.2–1.2)
Total Protein: 7.3 g/dL (ref 6.0–8.3)

## 2019-08-26 LAB — LDL CHOLESTEROL, DIRECT: Direct LDL: 49 mg/dL

## 2019-08-26 NOTE — Progress Notes (Signed)
Patient ID: Angel Collins, female   DOB: 1961-02-13, 59 y.o.   MRN: [id number]   Subjective:    Patient ID: Angel Collins, female    DOB: 1960-12-17, 59 y.o.   MRN: [id number]  HPI This visit occurred during the SARS-CoV-2 public health emergency.  Safety protocols were in place, including screening questions prior to the visit, additional usage of staff PPE, and extensive cleaning of exam room while observing appropriate contact time as indicated for disinfecting solutions.  Patient here for a scheduled follow up.  She reports she is doing relatively well.  Has been staying in due to covid restrictions.  Tries to stay active.  Does report low back pain that extends into her hip.  Previously extended into her leg.  Present for 2-3 months.  Worse when first gets up.  Lying flat - aggravates.  No chest pain or sob.  No acid reflux.  No abdominal pain.  Bowels moving.  Taking lavaza 2/day.  Saw Dr Alfonse Alpers - 05/2019.  victoza increased.  Started her on lasix.  Overall she feels things are stable.     Past Medical History:  Diagnosis Date  . Abnormal liver function    fatty liver  . 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 Gout   . Hypercholesterolemia   . Hyperglycemia   . Hypertension    Past Surgical History:  Procedure Laterality Date  . CESAREAN SECTION  1983  . ovarian cyst removed  1979  . TUBAL LIGATION  1987   Family History  Problem Relation Age of Onset  . Lung cancer Mother        died age 19  . Liver cancer Mother   . Liver cancer Father   . Hypertension Brother   . Hypercholesterolemia Brother   . Gout Brother   . Breast cancer Neg Hx    Social History   Socioeconomic History  . Marital status: Married    Spouse name: Not on file  . Number of children: 3  . Years of education: Not on file  . Highest education level: Not on file  Occupational History  . Not on file  Tobacco Use  . Smoking status: Never Smoker  . Smokeless tobacco: Never Used   Substance and Sexual Activity  . Alcohol use: Yes    Alcohol/week: 0.0 standard drinks    Comment: occasional  . Drug use: No  . Sexual activity: Not on file  Other Topics Concern  . Not on file  Social History Narrative  . Not on file   Social Determinants of Health   Financial Resource Strain: Medium Risk  . Difficulty of Paying Living Expenses: Somewhat hard  Food Insecurity:   . Worried About Charity fundraiser in the Last Year:   . Arboriculturist in the Last Year:   Transportation Needs:   . Film/video editor (Medical):   Marland Kitchen Lack of Transportation (Non-Medical):   Physical Activity:   . Days of Exercise per Week:   . Minutes of Exercise per Session:   Stress:   . Feeling of Stress :   Social Connections:   . Frequency of Communication with Friends and Family:   . Frequency of Social Gatherings with Friends and Family:   . Attends Religious Services:   . Active Member of Clubs or Organizations:   . Attends Archivist Meetings:   Marland Kitchen Marital Status:     Outpatient Encounter Medications  as of 08/26/2019  Medication Sig  . aspirin 81 MG tablet Take 81 mg by mouth daily.  . famotidine (PEPCID) 20 MG tablet Take 20 mg by mouth 2 (two) times daily.   . febuxostat (ULORIC) 40 MG tablet TAKE 1 TABLET BY MOUTH EVERY DAY  . furosemide (LASIX) 40 MG tablet Take 40 mg by mouth daily.  . Insulin Pen Needle (PEN NEEDLES) 32G X 4 MM MISC Inject 1 application into the skin as directed. With Victoza  . metoprolol tartrate (LOPRESSOR) 50 MG tablet TAKE 1 TABLET (50 MG TOTAL) BY MOUTH 2 (TWO) TIMES DAILY.  Marland Kitchen omega-3 acid ethyl esters (LOVAZA) 1 g capsule TAKE 1 CAPSULE BY MOUTH FOUR TIMES A DAY (Patient not taking: Reported on 05/30/2019)  . rosuvastatin (CRESTOR) 5 MG tablet TAKE 1 TABLET BY MOUTH EVERY DAY  . VICTOZA 18 MG/3ML SOPN INJECT 0.6 MG UNDER THE SKIN ONCE DAILY (Patient taking differently: 1.2 mg. )  . vitamin E 400 UNIT capsule Take 400 Units by mouth 2 (two)  times daily.   No facility-administered encounter medications on file as of 08/26/2019.    Review of Systems  Constitutional: Negative for appetite change and unexpected weight change.  HENT: Negative for congestion and sinus pressure.   Respiratory: Negative for cough, chest tightness and shortness of breath.   Cardiovascular: Negative for chest pain, palpitations and leg swelling.  Gastrointestinal: Negative for abdominal pain, diarrhea, nausea and vomiting.  Genitourinary: Negative for difficulty urinating and dysuria.  Musculoskeletal: Positive for back pain. Negative for joint swelling and myalgias.  Skin: Negative for color change and rash.  Neurological: Negative for dizziness, light-headedness and headaches.  Psychiatric/Behavioral: Negative for agitation and dysphoric mood.       Objective:    Physical Exam Constitutional:      General: She is not in acute distress.    Appearance: Normal appearance.  HENT:     Head: Normocephalic and atraumatic.     Right Ear: External ear normal.     Left Ear: External ear normal.  Eyes:     General:        Right eye: No discharge.        Left eye: No discharge.     Conjunctiva/sclera: Conjunctivae normal.  Neck:     Thyroid: No thyromegaly.  Cardiovascular:     Rate and Rhythm: Normal rate and regular rhythm.  Pulmonary:     Effort: No respiratory distress.     Breath sounds: Normal breath sounds. No wheezing.  Abdominal:     General: Bowel sounds are normal.     Palpations: Abdomen is soft.     Tenderness: There is no abdominal tenderness.  Musculoskeletal:        General: No swelling or tenderness.     Cervical back: Neck supple. No tenderness.  Lymphadenopathy:     Cervical: No cervical adenopathy.  Skin:    Findings: No erythema or rash.  Neurological:     Mental Status: She is alert.  Psychiatric:        Mood and Affect: Mood normal.        Behavior: Behavior normal.     BP 132/76   Pulse 82   Temp 97.7 F  (36.5 C)   Resp 16   Ht '5\' 1"'  (1.549 m)   Wt 168 lb (76.2 kg)   SpO2 97%   BMI 31.74 kg/m  Wt Readings from Last 3 Encounters:  08/26/19 168 lb (76.2 kg)  12/10/18 167  lb 6.4 oz (75.9 kg)  03/30/18 169 lb 12.8 oz (77 kg)     Lab Results  Component Value Date   WBC 7.8 05/09/2019   HGB 12.3 05/09/2019   HCT 37.4 05/09/2019   PLT 192.0 05/09/2019   GLUCOSE 106 (H) 08/26/2019   CHOL 155 08/26/2019   TRIG 342.0 (H) 08/26/2019   HDL 48.70 08/26/2019   LDLDIRECT 49.0 08/26/2019   LDLCALC 61 06/24/2016   ALT 31 08/26/2019   AST 26 08/26/2019   NA 136 08/26/2019   K 3.9 08/26/2019   CL 100 08/26/2019   CREATININE 2.49 (H) 08/26/2019   BUN 49 (H) 08/26/2019   CO2 25 08/26/2019   TSH 3.97 12/27/2018   HGBA1C 5.8 08/26/2019    MM 3D SCREEN BREAST BILATERAL  Result Date: 01/11/2019 CLINICAL DATA:  Screening. EXAM: DIGITAL SCREENING BILATERAL MAMMOGRAM WITH TOMO AND CAD COMPARISON:  Previous exam(s). ACR Breast Density Category c: The breast tissue is heterogeneously dense, which may obscure small masses. FINDINGS: There are no findings suspicious for malignancy. Images were processed with CAD. IMPRESSION: No mammographic evidence of malignancy. A result letter of this screening mammogram will be mailed directly to the patient. RECOMMENDATION: Screening mammogram in one year. (Code:SM-B-01Y) BI-RADS CATEGORY  1: Negative. Electronically Signed   By: Everlean Alstrom M.D.   On: 01/11/2019 09:21       Assessment & Plan:   Problem List Items Addressed This Visit    Anemia    S/p EGD and colonoscopy.  - 01/2019.  Recommended f/u in 3 years.  Follow cbc.       Chronic kidney disease (CKD), stage IV (severe) (HCC)    Followed by Dr Alfonse Alpers.  Avoid antiinflammatories.  Follow metabolic panel. On lasix.        Relevant Orders   Basic metabolic panel (Completed)   Fatty liver    Low carb diet and exercise.  Follow liver function tests.        GERD (gastroesophageal reflux  disease)    On omeprazole now.  Acid reflux controlled.  Follow.        Hyperglycemia - Primary    Low carb diet and exercise.  Follow met b and a1c.  On victoza.  Just increased dose.        Relevant Orders   Hemoglobin A1c (Completed)   Hypertension    Blood pressure under good control.  Continue same medication regimen - metoprolol.  Follow pressures.  Follow metabolic panel.         Hypertriglyceridemia    Low carb diet and exercise.  On lovaza.  Follow lipid panel.        Relevant Orders   Hepatic function panel (Completed)   Lipid panel (Completed)   Low back pain    Low back pain and pain into hip and previously down leg.  Persistent for the last 2-3 months.  Check xray.  May need therapy.  Follow.        Relevant Orders   DG Lumbar Spine 2-3 Views (Completed)   Metabolic syndrome    Low carb diet and exercise.  Weight loss.  On victoza.  Follow.            Einar Pheasant, MD

## 2019-08-28 ENCOUNTER — Other Ambulatory Visit: Payer: Self-pay | Admitting: Internal Medicine

## 2019-08-28 DIAGNOSIS — M5441 Lumbago with sciatica, right side: Secondary | ICD-10-CM

## 2019-08-28 NOTE — Progress Notes (Signed)
Order placed for Physical Therapy.

## 2019-09-08 ENCOUNTER — Encounter: Payer: Self-pay | Admitting: Internal Medicine

## 2019-09-08 NOTE — Assessment & Plan Note (Signed)
S/p EGD and colonoscopy.  - 01/2019.  Recommended f/u in 3 years.  Follow cbc.

## 2019-09-08 NOTE — Assessment & Plan Note (Signed)
Low carb diet and exercise.  Follow liver function tests.

## 2019-09-08 NOTE — Assessment & Plan Note (Addendum)
Followed by Dr Alfonse Alpers.  Avoid antiinflammatories.  Follow metabolic panel. On lasix.

## 2019-09-08 NOTE — Assessment & Plan Note (Signed)
Low back pain and pain into hip and previously down leg.  Persistent for the last 2-3 months.  Check xray.  May need therapy.  Follow.

## 2019-09-08 NOTE — Assessment & Plan Note (Signed)
Blood pressure under good control.  Continue same medication regimen - metoprolol.  Follow pressures.  Follow metabolic panel.

## 2019-09-08 NOTE — Assessment & Plan Note (Signed)
On omeprazole now.  Acid reflux controlled.  Follow.

## 2019-09-08 NOTE — Assessment & Plan Note (Signed)
Low carb diet and exercise.  Weight loss.  On victoza.  Follow.

## 2019-09-08 NOTE — Assessment & Plan Note (Signed)
Low carb diet and exercise.  On lovaza.  Follow lipid panel.

## 2019-09-08 NOTE — Assessment & Plan Note (Signed)
Low carb diet and exercise.  Follow met b and a1c.  On victoza.  Just increased dose.

## 2019-11-07 ENCOUNTER — Other Ambulatory Visit: Payer: Self-pay | Admitting: Internal Medicine

## 2020-01-01 ENCOUNTER — Other Ambulatory Visit: Payer: Self-pay

## 2020-01-01 ENCOUNTER — Other Ambulatory Visit (HOSPITAL_COMMUNITY)
Admission: RE | Admit: 2020-01-01 | Discharge: 2020-01-01 | Disposition: A | Discharge status: Home / Self Care | Payer: BC Managed Care – PPO | Source: Ambulatory Visit | Attending: Internal Medicine | Admitting: Internal Medicine

## 2020-01-01 ENCOUNTER — Encounter: Payer: Self-pay | Admitting: Internal Medicine

## 2020-01-01 ENCOUNTER — Ambulatory Visit (INDEPENDENT_AMBULATORY_CARE_PROVIDER_SITE_OTHER): Payer: BC Managed Care – PPO | Admitting: Internal Medicine

## 2020-01-01 VITALS — BP 136/78 | HR 70 | Temp 98.4°F | Resp 16 | Ht 61.0 in | Wt 171.0 lb

## 2020-01-01 DIAGNOSIS — Z124 Encounter for screening for malignant neoplasm of cervix: Secondary | ICD-10-CM | POA: Insufficient documentation

## 2020-01-01 DIAGNOSIS — Z0000 Encounter for general adult medical examination without abnormal findings: Secondary | ICD-10-CM

## 2020-01-01 DIAGNOSIS — Z23 Encounter for immunization: Secondary | ICD-10-CM

## 2020-01-01 DIAGNOSIS — K760 Fatty (change of) liver, not elsewhere classified: Secondary | ICD-10-CM

## 2020-01-01 DIAGNOSIS — I10 Essential (primary) hypertension: Secondary | ICD-10-CM

## 2020-01-01 DIAGNOSIS — D649 Anemia, unspecified: Secondary | ICD-10-CM

## 2020-01-01 DIAGNOSIS — E781 Pure hyperglyceridemia: Secondary | ICD-10-CM

## 2020-01-01 DIAGNOSIS — Z1231 Encounter for screening mammogram for malignant neoplasm of breast: Secondary | ICD-10-CM | POA: Diagnosis not present

## 2020-01-01 DIAGNOSIS — N184 Chronic kidney disease, stage 4 (severe): Secondary | ICD-10-CM

## 2020-01-01 DIAGNOSIS — E8881 Metabolic syndrome: Secondary | ICD-10-CM

## 2020-01-01 DIAGNOSIS — R739 Hyperglycemia, unspecified: Secondary | ICD-10-CM

## 2020-01-01 NOTE — Progress Notes (Signed)
Patient ID: Angel Collins, female   DOB: 1961/02/21, 59 y.o.   MRN: [id number]   Subjective:    Patient ID: Angel Collins, female    DOB: [birth year], 59 y.o.   MRN: [id number]  HPI This visit occurred during the SARS-CoV-2 public health emergency.  Safety protocols were in place, including screening questions prior to the visit, additional usage of staff PPE, and extensive cleaning of exam room while observing appropriate contact time as indicated for disinfecting solutions.  Patient here for her physical exam.  She reports she is doing relatively well.  Seeing Dr Alfonse Alpers for CKD.  Just evaluated 11/26/19 - stable.  Recommended f/u in 6 months.  Requested [place] to add on labs to next lab draw.  victoza increased to 1.8.  Tries to stay active. No chest pain or sob reported.  Discussed diet and exercise.  No abdominal pain.  Bowels moving.  Blood pressure doing well.  Insurance not Financial trader.     Past Medical History:  Diagnosis Date  . Abnormal liver function    fatty liver  . 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 Gout   . Hypercholesterolemia   . Hyperglycemia   . Hypertension    Past Surgical History:  Procedure Laterality Date  . CESAREAN SECTION  1983  . ovarian cyst removed  1979  . TUBAL LIGATION  1987   Family History  Problem Relation Age of Onset  . Lung cancer Mother        died age 52  . Liver cancer Mother   . Liver cancer Father   . Hypertension Brother   . Hypercholesterolemia Brother   . Gout Brother   . Breast cancer Neg Hx    Social History   Socioeconomic History  . Marital status: Married    Spouse name: Not on file  . Number of children: 3  . Years of education: Not on file  . Highest education level: Not on file  Occupational History  . Not on file  Tobacco Use  . Smoking status: Never Smoker  . Smokeless tobacco: Never Used  Substance and Sexual Activity  . Alcohol use: Yes    Alcohol/week: 0.0 standard drinks     Comment: occasional  . Drug use: No  . Sexual activity: Not on file  Other Topics Concern  . Not on file  Social History Narrative  . Not on file   Social Determinants of Health   Financial Resource Strain: Medium Risk  . Difficulty of Paying Living Expenses: Somewhat hard  Food Insecurity:   . Worried About Charity fundraiser in the Last Year: Not on file  . Ran Out of Food in the Last Year: Not on file  Transportation Needs:   . Lack of Transportation (Medical): Not on file  . Lack of Transportation (Non-Medical): Not on file  Physical Activity:   . Days of Exercise per Week: Not on file  . Minutes of Exercise per Session: Not on file  Stress:   . Feeling of Stress : Not on file  Social Connections:   . Frequency of Communication with Friends and Family: Not on file  . Frequency of Social Gatherings with Friends and Family: Not on file  . Attends Religious Services: Not on file  . Active Member of Clubs or Organizations: Not on file  . Attends Archivist Meetings: Not on file  . Marital Status: Not on file  Outpatient Encounter Medications as of 01/01/2020  Medication Sig  . aspirin 81 MG tablet Take 81 mg by mouth daily.  . famotidine (PEPCID) 20 MG tablet Take 20 mg by mouth 2 (two) times daily.   . febuxostat (ULORIC) 40 MG tablet TAKE 1 TABLET BY MOUTH EVERY DAY  . furosemide (LASIX) 40 MG tablet Take 40 mg by mouth daily.  . Insulin Pen Needle (PEN NEEDLES) 32G X 4 MM MISC Inject 1 application into the skin as directed. With Victoza  . metoprolol tartrate (LOPRESSOR) 50 MG tablet TAKE 1 TABLET (50 MG TOTAL) BY MOUTH 2 (TWO) TIMES DAILY.  Marland Kitchen omega-3 acid ethyl esters (LOVAZA) 1 g capsule TAKE 1 CAPSULE BY MOUTH FOUR TIMES A DAY (Patient not taking: Reported on 05/30/2019)  . rosuvastatin (CRESTOR) 5 MG tablet TAKE 1 TABLET BY MOUTH EVERY DAY  . VICTOZA 18 MG/3ML SOPN INJECT 0.6 MG UNDER THE SKIN ONCE DAILY (Patient taking differently: 1.2 mg. )  . vitamin E  400 UNIT capsule Take 400 Units by mouth 2 (two) times daily.   No facility-administered encounter medications on file as of 01/01/2020.    Review of Systems  Constitutional: Negative for appetite change and unexpected weight change.  HENT: Negative for congestion and sinus pressure.   Eyes: Negative for pain and visual disturbance.  Respiratory: Negative for cough, chest tightness and shortness of breath.   Cardiovascular: Negative for chest pain, palpitations and leg swelling.  Gastrointestinal: Negative for abdominal pain, diarrhea, nausea and vomiting.  Genitourinary: Negative for difficulty urinating and dysuria.  Musculoskeletal: Negative for joint swelling and myalgias.  Skin: Negative for color change and rash.  Neurological: Negative for dizziness, light-headedness and headaches.  Hematological: Negative for adenopathy. Does not bruise/bleed easily.  Psychiatric/Behavioral: Negative for agitation and dysphoric mood.       Objective:    Physical Exam Vitals reviewed.  Constitutional:      General: She is not in acute distress.    Appearance: Normal appearance. She is well-developed.  HENT:     Head: Normocephalic and atraumatic.     Right Ear: External ear normal.     Left Ear: External ear normal.  Eyes:     General: No scleral icterus.       Right eye: No discharge.        Left eye: No discharge.     Conjunctiva/sclera: Conjunctivae normal.  Neck:     Thyroid: No thyromegaly.  Cardiovascular:     Rate and Rhythm: Normal rate and regular rhythm.  Pulmonary:     Effort: No tachypnea, accessory muscle usage or respiratory distress.     Breath sounds: Normal breath sounds. No decreased breath sounds or wheezing.  Chest:     Breasts:        Right: No inverted nipple, mass, nipple discharge or tenderness (no axillary adenopathy).        Left: No inverted nipple, mass, nipple discharge or tenderness (no axilarry adenopathy).  Abdominal:     General: Bowel sounds are  normal.     Palpations: Abdomen is soft.     Tenderness: There is no abdominal tenderness.  Genitourinary:    Comments: Normal external genitalia.  Vaginal vault without lesions.  Cervix identified.  Pap smear performed.  Could not appreciate any adnexal masses or tenderness.   Musculoskeletal:        General: No swelling or tenderness.     Cervical back: Neck supple. No tenderness.  Lymphadenopathy:  Cervical: No cervical adenopathy.  Skin:    Findings: No erythema or rash.  Neurological:     Mental Status: She is alert and oriented to person, place, and time.  Psychiatric:        Mood and Affect: Mood normal.        Behavior: Behavior normal.     BP 136/78   Pulse 70   Temp 98.4 F (36.9 C) (Oral)   Resp 16   Ht '5\' 1"'  (1.549 m)   Wt 171 lb (77.6 kg)   SpO2 98%   BMI 32.31 kg/m  Wt Readings from Last 3 Encounters:  01/01/20 171 lb (77.6 kg)  08/26/19 168 lb (76.2 kg)  12/10/18 167 lb 6.4 oz (75.9 kg)     Lab Results  Component Value Date   WBC 7.8 05/09/2019   HGB 12.3 05/09/2019   HCT 37.4 05/09/2019   PLT 192.0 05/09/2019   GLUCOSE 106 (H) 08/26/2019   CHOL 155 08/26/2019   TRIG 342.0 (H) 08/26/2019   HDL 48.70 08/26/2019   LDLDIRECT 49.0 08/26/2019   LDLCALC 61 06/24/2016   ALT 31 08/26/2019   AST 26 08/26/2019   NA 136 08/26/2019   K 3.9 08/26/2019   CL 100 08/26/2019   CREATININE 2.49 (H) 08/26/2019   BUN 49 (H) 08/26/2019   CO2 25 08/26/2019   TSH 3.97 12/27/2018   HGBA1C 5.8 08/26/2019    MM 3D SCREEN BREAST BILATERAL  Result Date: 01/11/2019 CLINICAL DATA:  Screening. EXAM: DIGITAL SCREENING BILATERAL MAMMOGRAM WITH TOMO AND CAD COMPARISON:  Previous exam(s). ACR Breast Density Category c: The breast tissue is heterogeneously dense, which may obscure small masses. FINDINGS: There are no findings suspicious for malignancy. Images were processed with CAD. IMPRESSION: No mammographic evidence of malignancy. A result letter of this screening  mammogram will be mailed directly to the patient. RECOMMENDATION: Screening mammogram in one year. (Code:SM-B-01Y) BI-RADS CATEGORY  1: Negative. Electronically Signed   By: Everlean Alstrom M.D.   On: 01/11/2019 09:21       Assessment & Plan:   Problem List Items Addressed This Visit    Metabolic syndrome    Low carb diet and exercise.  Weight loss. On victoza.  Follow.       Hypertriglyceridemia    Low carb diet and exercise.  Has been on lovaza. Cost is an issue.  D/w pharmacy.       Relevant Orders   Lipid panel   Hypertension    Blood pressure under good control.  Continue metoprolol.  Follow pressures.  Follow metabolic panel.       Relevant Orders   TSH   Hyperglycemia    Low carb diet and exercise.  Continue on victoza.  Does just increased to 1.8.  Follow met b and a1c.       Relevant Orders   Hemoglobin A1c   Health care maintenance    Physical today 01/01/20.  PAP 06/2016 - negative with negative HPV.  Repeat pap today.  Colonoscopy 01/16/19 - recommended f/u in 3 years.        Fatty liver    Low carb diet, exercise and weight loss.  Follow liver function tests.        Relevant Orders   Hepatic function panel   Chronic kidney disease (CKD), stage IV (severe) ([facility])    Followed by Dr Alfonse Alpers.  Avoid antiinflammatories.  Check PTH, phosphorus level and hgb with next labs.  Relevant Orders   Basic metabolic panel   Phosphorus   PTH, intact (no Ca)   Anemia    S/p EGD and colonoscopy 01/2019. Follow [facility].       Relevant Orders   CBC with Differential/Platelet    Other Visit Diagnoses    Visit for screening mammogram    -  Primary   Relevant Orders   MM 3D SCREEN BREAST BILATERAL   Cervical cancer screening       Relevant Orders   Cytology - PAP( [facility]) (Completed)   Need for immunization against influenza       Relevant Orders   Flu Vaccine QUAD 36+ mos IM (Completed)       Einar Pheasant, MD

## 2020-01-01 NOTE — Assessment & Plan Note (Addendum)
Physical today 01/01/20.  PAP 06/2016 - negative with negative HPV.  Repeat pap today.  Colonoscopy 01/16/19 - recommended f/u in 3 years.

## 2020-01-02 LAB — CYTOLOGY - PAP
Comment: NEGATIVE
Diagnosis: NEGATIVE
High risk HPV: NEGATIVE

## 2020-01-06 ENCOUNTER — Encounter: Payer: Self-pay | Admitting: Internal Medicine

## 2020-01-06 NOTE — Assessment & Plan Note (Signed)
Low carb diet and exercise.  Weight loss. On victoza.  Follow.

## 2020-01-06 NOTE — Assessment & Plan Note (Signed)
Followed by Dr Alfonse Alpers.  Avoid antiinflammatories.  Check PTH, phosphorus level and hgb with next labs.

## 2020-01-06 NOTE — Assessment & Plan Note (Signed)
S/p EGD and colonoscopy 01/2019. Follow cbc.

## 2020-01-06 NOTE — Assessment & Plan Note (Signed)
Low carb diet, exercise and weight loss.  Follow liver function tests.

## 2020-01-06 NOTE — Assessment & Plan Note (Signed)
Low carb diet and exercise.  Has been on lovaza. Cost is an issue.  D/w pharmacy.

## 2020-01-06 NOTE — Assessment & Plan Note (Signed)
Low carb diet and exercise.  Continue on victoza.  Does just increased to 1.8.  Follow met b and a1c.

## 2020-01-06 NOTE — Assessment & Plan Note (Signed)
Blood pressure under good control.  Continue metoprolol.  Follow pressures.  Follow metabolic panel.

## 2020-01-17 ENCOUNTER — Ambulatory Visit
Admission: RE | Admit: 2020-01-17 | Discharge: 2020-01-17 | Disposition: A | Discharge status: Home / Self Care | Payer: BC Managed Care – PPO | Source: Ambulatory Visit | Attending: Internal Medicine | Admitting: Internal Medicine

## 2020-01-17 ENCOUNTER — Other Ambulatory Visit: Payer: Self-pay

## 2020-01-17 DIAGNOSIS — Z1231 Encounter for screening mammogram for malignant neoplasm of breast: Secondary | ICD-10-CM | POA: Diagnosis not present

## 2020-01-21 ENCOUNTER — Other Ambulatory Visit: Payer: Self-pay | Admitting: Internal Medicine

## 2020-01-22 ENCOUNTER — Other Ambulatory Visit: Payer: Self-pay

## 2020-01-22 ENCOUNTER — Other Ambulatory Visit (INDEPENDENT_AMBULATORY_CARE_PROVIDER_SITE_OTHER): Payer: BC Managed Care – PPO

## 2020-01-22 DIAGNOSIS — K760 Fatty (change of) liver, not elsewhere classified: Secondary | ICD-10-CM | POA: Diagnosis not present

## 2020-01-22 DIAGNOSIS — E781 Pure hyperglyceridemia: Secondary | ICD-10-CM | POA: Diagnosis not present

## 2020-01-22 DIAGNOSIS — I10 Essential (primary) hypertension: Secondary | ICD-10-CM | POA: Diagnosis not present

## 2020-01-22 DIAGNOSIS — D649 Anemia, unspecified: Secondary | ICD-10-CM

## 2020-01-22 DIAGNOSIS — R739 Hyperglycemia, unspecified: Secondary | ICD-10-CM

## 2020-01-22 DIAGNOSIS — N184 Chronic kidney disease, stage 4 (severe): Secondary | ICD-10-CM | POA: Diagnosis not present

## 2020-01-22 LAB — HEPATIC FUNCTION PANEL
ALT: 42 U/L — ABNORMAL HIGH (ref 0–35)
AST: 26 U/L (ref 0–37)
Albumin: 3.8 g/dL (ref 3.5–5.2)
Alkaline Phosphatase: 100 U/L (ref 39–117)
Bilirubin, Direct: 0.1 mg/dL (ref 0.0–0.3)
Total Bilirubin: 0.5 mg/dL (ref 0.2–1.2)
Total Protein: 6.3 g/dL (ref 6.0–8.3)

## 2020-01-22 LAB — LIPID PANEL
Cholesterol: 139 mg/dL (ref 0–200)
HDL: 56 mg/dL (ref >39.00)
LDL Cholesterol: 44 mg/dL (ref 0–99)
NonHDL: 83.36
Total CHOL/HDL Ratio: 2
Triglycerides: 199 mg/dL — ABNORMAL HIGH (ref 0.0–149.0)
VLDL: 39.8 mg/dL (ref 0.0–40.0)

## 2020-01-22 LAB — CBC WITH DIFFERENTIAL/PLATELET
Basophils Absolute: 0 10*3/uL (ref 0.0–0.1)
Basophils Relative: 0.5 % (ref 0.0–3.0)
Eosinophils Absolute: 0 10*3/uL (ref 0.0–0.7)
Eosinophils Relative: 0.2 % (ref 0.0–5.0)
HCT: 32.9 % — ABNORMAL LOW (ref 36.0–46.0)
Hemoglobin: 10.9 g/dL — ABNORMAL LOW (ref 12.0–15.0)
Lymphocytes Relative: 28.5 % (ref 12.0–46.0)
Lymphs Abs: 2.8 10*3/uL (ref 0.7–4.0)
MCHC: 33.2 g/dL (ref 30.0–36.0)
MCV: 95.7 fl (ref 78.0–100.0)
Monocytes Absolute: 0.9 10*3/uL (ref 0.1–1.0)
Monocytes Relative: 8.6 % (ref 3.0–12.0)
Neutro Abs: 6.2 10*3/uL (ref 1.4–7.7)
Neutrophils Relative %: 62.2 % (ref 43.0–77.0)
Platelets: 177 10*3/uL (ref 150.0–400.0)
RBC: 3.44 Mil/uL — ABNORMAL LOW (ref 3.87–5.11)
RDW: 13.8 % (ref 11.5–15.5)
WBC: 9.9 10*3/uL (ref 4.0–10.5)

## 2020-01-22 LAB — HEMOGLOBIN A1C: Hgb A1c MFr Bld: 6.1 % (ref 4.6–6.5)

## 2020-01-22 LAB — BASIC METABOLIC PANEL
BUN: 47 mg/dL — ABNORMAL HIGH (ref 6–23)
CO2: 29 mEq/L (ref 19–32)
Calcium: 8.6 mg/dL (ref 8.4–10.5)
Chloride: 104 mEq/L (ref 96–112)
Creatinine, Ser: 2.46 mg/dL — ABNORMAL HIGH (ref 0.40–1.20)
GFR: 20.46 mL/min — ABNORMAL LOW (ref >60.00)
Glucose, Bld: 104 mg/dL — ABNORMAL HIGH (ref 70–99)
Potassium: 3.6 mEq/L (ref 3.5–5.1)
Sodium: 141 mEq/L (ref 135–145)

## 2020-01-22 LAB — PHOSPHORUS: Phosphorus: 3.6 mg/dL (ref 2.3–4.6)

## 2020-01-22 LAB — TSH: TSH: 1.25 u[IU]/mL (ref 0.35–4.50)

## 2020-01-23 ENCOUNTER — Other Ambulatory Visit: Payer: Self-pay | Admitting: Internal Medicine

## 2020-01-23 DIAGNOSIS — D649 Anemia, unspecified: Secondary | ICD-10-CM

## 2020-01-23 LAB — PARATHYROID HORMONE, INTACT (NO CA): PTH: 407 pg/mL — ABNORMAL HIGH (ref 14–64)

## 2020-01-23 LAB — EXTRA SPECIMEN

## 2020-01-23 NOTE — Progress Notes (Signed)
Orders placed for f/u labs.  

## 2020-01-29 ENCOUNTER — Other Ambulatory Visit: Payer: Self-pay

## 2020-01-29 ENCOUNTER — Other Ambulatory Visit (INDEPENDENT_AMBULATORY_CARE_PROVIDER_SITE_OTHER): Payer: BC Managed Care – PPO

## 2020-01-29 DIAGNOSIS — D649 Anemia, unspecified: Secondary | ICD-10-CM | POA: Diagnosis not present

## 2020-01-30 LAB — CBC WITH DIFFERENTIAL/PLATELET
Basophils Absolute: 0 10*3/uL (ref 0.0–0.1)
Basophils Relative: 0.6 % (ref 0.0–3.0)
Eosinophils Absolute: 0.2 10*3/uL (ref 0.0–0.7)
Eosinophils Relative: 2.6 % (ref 0.0–5.0)
HCT: 38.3 % (ref 36.0–46.0)
Hemoglobin: 12.6 g/dL (ref 12.0–15.0)
Lymphocytes Relative: 39.3 % (ref 12.0–46.0)
Lymphs Abs: 3.2 10*3/uL (ref 0.7–4.0)
MCHC: 33.1 g/dL (ref 30.0–36.0)
MCV: 95 fl (ref 78.0–100.0)
Monocytes Absolute: 1.1 10*3/uL — ABNORMAL HIGH (ref 0.1–1.0)
Monocytes Relative: 12.9 % — ABNORMAL HIGH (ref 3.0–12.0)
Neutro Abs: 3.7 10*3/uL (ref 1.4–7.7)
Neutrophils Relative %: 44.6 % (ref 43.0–77.0)
Platelets: 188 10*3/uL (ref 150.0–400.0)
RBC: 4.03 Mil/uL (ref 3.87–5.11)
RDW: 13.9 % (ref 11.5–15.5)
WBC: 8.2 10*3/uL (ref 4.0–10.5)

## 2020-01-30 LAB — IBC + FERRITIN
Ferritin: 389.8 ng/mL — ABNORMAL HIGH (ref 10.0–291.0)
Iron: 76 ug/dL (ref 42–145)
Saturation Ratios: 18.3 % — ABNORMAL LOW (ref 20.0–50.0)
Transferrin: 297 mg/dL (ref 212.0–360.0)

## 2020-01-30 LAB — VITAMIN B12: Vitamin B-12: 200 pg/mL — ABNORMAL LOW (ref 211–911)

## 2020-02-05 ENCOUNTER — Ambulatory Visit (INDEPENDENT_AMBULATORY_CARE_PROVIDER_SITE_OTHER): Payer: BC Managed Care – PPO

## 2020-02-05 ENCOUNTER — Other Ambulatory Visit: Payer: Self-pay

## 2020-02-05 DIAGNOSIS — E538 Deficiency of other specified B group vitamins: Secondary | ICD-10-CM | POA: Diagnosis not present

## 2020-02-05 MED ORDER — CYANOCOBALAMIN 1000 MCG/ML IJ SOLN
1000.0000 ug | Freq: Once | INTRAMUSCULAR | Status: AC
  Administered 2020-02-05: 1000 ug via INTRAMUSCULAR

## 2020-02-05 NOTE — Progress Notes (Addendum)
Patient presented for B 12 injection to left deltoid, patient voiced no concerns nor showed any signs of distress during injection.  Reviewed.  Dr Scott 

## 2020-02-12 ENCOUNTER — Other Ambulatory Visit: Payer: Self-pay

## 2020-02-12 ENCOUNTER — Ambulatory Visit (INDEPENDENT_AMBULATORY_CARE_PROVIDER_SITE_OTHER): Payer: BC Managed Care – PPO

## 2020-02-12 DIAGNOSIS — E538 Deficiency of other specified B group vitamins: Secondary | ICD-10-CM | POA: Diagnosis not present

## 2020-02-12 MED ORDER — CYANOCOBALAMIN 1000 MCG/ML IJ SOLN
1000.0000 ug | Freq: Once | INTRAMUSCULAR | Status: AC
  Administered 2020-02-12: 1000 ug via INTRAMUSCULAR

## 2020-02-12 NOTE — Progress Notes (Signed)
Patient presented for B 12 injection to left deltoid, patient voiced no concerns nor showed any signs of distress during injection. 

## 2020-02-19 ENCOUNTER — Ambulatory Visit (INDEPENDENT_AMBULATORY_CARE_PROVIDER_SITE_OTHER): Payer: BC Managed Care – PPO

## 2020-02-19 ENCOUNTER — Other Ambulatory Visit: Payer: Self-pay

## 2020-02-19 DIAGNOSIS — E538 Deficiency of other specified B group vitamins: Secondary | ICD-10-CM | POA: Diagnosis not present

## 2020-02-19 MED ORDER — CYANOCOBALAMIN 1000 MCG/ML IJ SOLN
1000.0000 ug | Freq: Once | INTRAMUSCULAR | Status: AC
  Administered 2020-02-19: 1000 ug via INTRAMUSCULAR

## 2020-02-19 NOTE — Progress Notes (Addendum)
Patient presented for B 12 injection to left deltoid, patient voiced no concerns nor showed any signs of distress during injection.  Reviewed.  Dr Scott 

## 2020-02-26 ENCOUNTER — Ambulatory Visit (INDEPENDENT_AMBULATORY_CARE_PROVIDER_SITE_OTHER): Payer: BC Managed Care – PPO

## 2020-02-26 ENCOUNTER — Other Ambulatory Visit: Payer: Self-pay

## 2020-02-26 DIAGNOSIS — E538 Deficiency of other specified B group vitamins: Secondary | ICD-10-CM | POA: Diagnosis not present

## 2020-02-26 MED ORDER — CYANOCOBALAMIN 1000 MCG/ML IJ SOLN
1000.0000 ug | Freq: Once | INTRAMUSCULAR | Status: AC
  Administered 2020-02-26: 1000 ug via INTRAMUSCULAR

## 2020-02-26 NOTE — Progress Notes (Signed)
Patient presented for B 12 injection to left deltoid, patient voiced no concerns nor showed any signs of distress during injection. 

## 2020-03-04 ENCOUNTER — Other Ambulatory Visit: Payer: Self-pay | Admitting: Internal Medicine

## 2020-03-27 ENCOUNTER — Other Ambulatory Visit: Payer: Self-pay

## 2020-03-27 ENCOUNTER — Ambulatory Visit (INDEPENDENT_AMBULATORY_CARE_PROVIDER_SITE_OTHER): Payer: BC Managed Care – PPO

## 2020-03-27 DIAGNOSIS — E538 Deficiency of other specified B group vitamins: Secondary | ICD-10-CM | POA: Diagnosis not present

## 2020-03-27 MED ORDER — CYANOCOBALAMIN 1000 MCG/ML IJ SOLN
1000.0000 ug | Freq: Once | INTRAMUSCULAR | Status: AC
  Administered 2020-03-27: 09:00:00 1000 ug via INTRAMUSCULAR

## 2020-03-27 NOTE — Progress Notes (Signed)
Patient presented for B 12 injection to left deltoid, patient voiced no concerns nor showed any signs of distress during injection. 

## 2020-05-05 ENCOUNTER — Other Ambulatory Visit: Payer: BC Managed Care – PPO

## 2020-05-07 ENCOUNTER — Encounter: Payer: Self-pay | Admitting: Internal Medicine

## 2020-05-07 ENCOUNTER — Other Ambulatory Visit: Payer: Self-pay

## 2020-05-07 ENCOUNTER — Ambulatory Visit: Payer: BC Managed Care – PPO | Admitting: Internal Medicine

## 2020-05-07 VITALS — BP 128/72 | HR 87 | Temp 98.2°F | Resp 16 | Ht 61.0 in | Wt 173.4 lb

## 2020-05-07 DIAGNOSIS — E8881 Metabolic syndrome: Secondary | ICD-10-CM

## 2020-05-07 DIAGNOSIS — R945 Abnormal results of liver function studies: Secondary | ICD-10-CM | POA: Diagnosis not present

## 2020-05-07 DIAGNOSIS — E781 Pure hyperglyceridemia: Secondary | ICD-10-CM | POA: Diagnosis not present

## 2020-05-07 DIAGNOSIS — R739 Hyperglycemia, unspecified: Secondary | ICD-10-CM | POA: Diagnosis not present

## 2020-05-07 DIAGNOSIS — K760 Fatty (change of) liver, not elsewhere classified: Secondary | ICD-10-CM

## 2020-05-07 DIAGNOSIS — I10 Essential (primary) hypertension: Secondary | ICD-10-CM | POA: Diagnosis not present

## 2020-05-07 DIAGNOSIS — N184 Chronic kidney disease, stage 4 (severe): Secondary | ICD-10-CM

## 2020-05-07 DIAGNOSIS — R7989 Other specified abnormal findings of blood chemistry: Secondary | ICD-10-CM

## 2020-05-07 LAB — HEMOGLOBIN A1C: Hgb A1c MFr Bld: 6 % (ref 4.6–6.5)

## 2020-05-07 LAB — BASIC METABOLIC PANEL
BUN: 63 mg/dL — ABNORMAL HIGH (ref 6–23)
CO2: 25 mEq/L (ref 19–32)
Calcium: 10.2 mg/dL (ref 8.4–10.5)
Chloride: 103 mEq/L (ref 96–112)
Creatinine, Ser: 2.49 mg/dL — ABNORMAL HIGH (ref 0.40–1.20)
GFR: 20.37 mL/min — ABNORMAL LOW (ref >60.00)
Glucose, Bld: 100 mg/dL — ABNORMAL HIGH (ref 70–99)
Potassium: 4.1 mEq/L (ref 3.5–5.1)
Sodium: 138 mEq/L (ref 135–145)

## 2020-05-07 LAB — LIPID PANEL
Cholesterol: 157 mg/dL (ref 0–200)
HDL: 46.3 mg/dL (ref >39.00)
NonHDL: 110.67
Total CHOL/HDL Ratio: 3
Triglycerides: 272 mg/dL — ABNORMAL HIGH (ref 0.0–149.0)
VLDL: 54.4 mg/dL — ABNORMAL HIGH (ref 0.0–40.0)

## 2020-05-07 LAB — HEPATIC FUNCTION PANEL
ALT: 56 U/L — ABNORMAL HIGH (ref 0–35)
AST: 50 U/L — ABNORMAL HIGH (ref 0–37)
Albumin: 4.4 g/dL (ref 3.5–5.2)
Alkaline Phosphatase: 125 U/L — ABNORMAL HIGH (ref 39–117)
Bilirubin, Direct: 0.1 mg/dL (ref 0.0–0.3)
Total Bilirubin: 0.6 mg/dL (ref 0.2–1.2)
Total Protein: 7.2 g/dL (ref 6.0–8.3)

## 2020-05-07 LAB — LDL CHOLESTEROL, DIRECT: Direct LDL: 67 mg/dL

## 2020-05-07 NOTE — Progress Notes (Signed)
Patient ID: Angel Collins, female   DOB: 12-17-1960, 60 y.o.   MRN: [id number]   Subjective:    Patient ID: Angel Collins, female    DOB: Jun 23, 1960, 60 y.o.   MRN: [id number]  HPI This visit occurred during the SARS-CoV-2 public health emergency.  Safety protocols were in place, including screening questions prior to the visit, additional usage of staff PPE, and extensive cleaning of exam room while observing appropriate contact time as indicated for disinfecting solutions.  Patient here for a scheduled follow up. Here to follow up regarding her cholesterol and blood pressure.  Also f/u regarding her kidney function.  She is doing well.  Feels good.  Trying to stay active. No chest pain or sob with increased activity or exertion.  No acid reflux.  No abdominal pain.  Bowels moving.  She will call and schedule f/u with Dr Mathis Fare regarding her kidneys.     Past Medical History:  Diagnosis Date  . Abnormal liver function    fatty liver  . 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 Gout   . Hypercholesterolemia   . Hyperglycemia   . Hypertension    Past Surgical History:  Procedure Laterality Date  . CESAREAN SECTION  1983  . ovarian cyst removed  1979  . TUBAL LIGATION  1987   Family History  Problem Relation Age of Onset  . Lung cancer Mother        died age 55  . Liver cancer Mother   . Liver cancer Father   . Hypertension Brother   . Hypercholesterolemia Brother   . Gout Brother   . Breast cancer Neg Hx    Social History   Socioeconomic History  . Marital status: Married    Spouse name: Not on file  . Number of children: 3  . Years of education: Not on file  . Highest education level: Not on file  Occupational History  . Not on file  Tobacco Use  . Smoking status: Never Smoker  . Smokeless tobacco: Never Used  Substance and Sexual Activity  . Alcohol use: Yes    Alcohol/week: 0.0 standard drinks    Comment: occasional  . Drug use: No  .  Sexual activity: Not on file  Other Topics Concern  . Not on file  Social History Narrative  . Not on file   Social Determinants of Health   Financial Resource Strain: Medium Risk  . Difficulty of Paying Living Expenses: Somewhat hard  Food Insecurity: Not on file  Transportation Needs: Not on file  Physical Activity: Not on file  Stress: Not on file  Social Connections: Not on file    Outpatient Encounter Medications as of 05/07/2020  Medication Sig  . aspirin 81 MG tablet Take 81 mg by mouth daily.  . BD PEN NEEDLE NANO 2ND GEN 32G X 4 MM MISC INJECT 1 APPLICATION INTO THE SKIN AS DIRECTED. WITH VICTOZA  . famotidine (PEPCID) 20 MG tablet Take 20 mg by mouth 2 (two) times daily.   . febuxostat (ULORIC) 40 MG tablet TAKE 1 TABLET BY MOUTH EVERY DAY  . furosemide (LASIX) 40 MG tablet Take 40 mg by mouth daily.  . metoprolol tartrate (LOPRESSOR) 50 MG tablet TAKE 1 TABLET (50 MG TOTAL) BY MOUTH 2 (TWO) TIMES DAILY.  Marland Kitchen omega-3 acid ethyl esters (LOVAZA) 1 g capsule TAKE 1 CAPSULE BY MOUTH FOUR TIMES A DAY (Patient not taking: Reported on 05/30/2019)  . rosuvastatin (  CRESTOR) 5 MG tablet TAKE 1 TABLET BY MOUTH EVERY DAY  . VICTOZA 18 MG/3ML SOPN INJECT 0.6 MG UNDER THE SKIN ONCE DAILY (Patient taking differently: 1.2 mg. )  . vitamin E 400 UNIT capsule Take 400 Units by mouth 2 (two) times daily.   No facility-administered encounter medications on file as of 05/07/2020.    Review of Systems  Constitutional: Negative for appetite change and unexpected weight change.  HENT: Negative for congestion and sinus pressure.   Respiratory: Negative for cough, chest tightness and shortness of breath.   Cardiovascular: Negative for chest pain, palpitations and leg swelling.  Gastrointestinal: Negative for abdominal pain, diarrhea, nausea and vomiting.  Genitourinary: Negative for difficulty urinating and dysuria.  Musculoskeletal: Negative for joint swelling and myalgias.  Skin: Negative for  color change and rash.  Neurological: Negative for dizziness, light-headedness and headaches.  Psychiatric/Behavioral: Negative for agitation and dysphoric mood.       Objective:    Physical Exam Vitals reviewed.  Constitutional:      General: She is not in acute distress.    Appearance: Normal appearance.  HENT:     Head: Normocephalic and atraumatic.     Right Ear: External ear normal.     Left Ear: External ear normal.     Mouth/Throat:     Mouth: Oropharynx is clear and moist.  Eyes:     General: No scleral icterus.       Right eye: No discharge.        Left eye: No discharge.     Conjunctiva/sclera: Conjunctivae normal.  Neck:     Thyroid: No thyromegaly.  Cardiovascular:     Rate and Rhythm: Normal rate and regular rhythm.  Pulmonary:     Effort: No respiratory distress.     Breath sounds: Normal breath sounds. No wheezing.  Abdominal:     General: Bowel sounds are normal.     Palpations: Abdomen is soft.     Tenderness: There is no abdominal tenderness.  Musculoskeletal:        General: No swelling, tenderness or edema.     Cervical back: Neck supple. No tenderness.  Lymphadenopathy:     Cervical: No cervical adenopathy.  Skin:    Findings: No erythema or rash.  Neurological:     Mental Status: She is alert.  Psychiatric:        Mood and Affect: Mood normal.        Behavior: Behavior normal.     BP 128/72   Pulse 87   Temp 98.2 F (36.8 C) (Oral)   Resp 16   Ht '5\' 1"'  (1.549 m)   Wt 173 lb 6.4 oz (78.7 kg)   SpO2 94%   BMI 32.76 kg/m  Wt Readings from Last 3 Encounters:  05/07/20 173 lb 6.4 oz (78.7 kg)  01/01/20 171 lb (77.6 kg)  08/26/19 168 lb (76.2 kg)     Lab Results  Component Value Date   WBC 8.2 01/29/2020   HGB 12.6 01/29/2020   HCT 38.3 01/29/2020   PLT 188.0 01/29/2020   GLUCOSE 100 (H) 05/07/2020   CHOL 157 05/07/2020   TRIG 272.0 (H) 05/07/2020   HDL 46.30 05/07/2020   LDLDIRECT 67.0 05/07/2020   LDLCALC 44 01/22/2020    ALT 56 (H) 05/07/2020   AST 50 (H) 05/07/2020   NA 138 05/07/2020   K 4.1 05/07/2020   CL 103 05/07/2020   CREATININE 2.49 (H) 05/07/2020   BUN 63 (H) 05/07/2020  CO2 25 05/07/2020   TSH 1.25 01/22/2020   HGBA1C 6.0 05/07/2020    MM 3D SCREEN BREAST BILATERAL  Result Date: 01/17/2020 CLINICAL DATA:  Screening. EXAM: DIGITAL SCREENING BILATERAL MAMMOGRAM WITH TOMO AND CAD COMPARISON:  Previous exam(s). ACR Breast Density Category b: There are scattered areas of fibroglandular density. FINDINGS: There are no findings suspicious for malignancy. Images were processed with CAD. IMPRESSION: No mammographic evidence of malignancy. A result letter of this screening mammogram will be mailed directly to the patient. RECOMMENDATION: Screening mammogram in one year. (Code:SM-B-01Y) BI-RADS CATEGORY  1: Negative. Electronically Signed   By: Margarette Canada M.D.   On: 01/17/2020 14:03       Assessment & Plan:   Problem List Items Addressed This Visit    Abnormal liver function tests    Has been worked up by GI.  S/p liver biopsy.  Discussed diet and exercise.  Weight loss.  Follow liver function tests.        Chronic kidney disease (CKD), stage IV (severe) ([facility])    Followed by Dr Alfonse Alpers.  Avoid antiinflammatories.  Recheck metabolic panel.       Relevant Orders   Basic metabolic panel (Completed)   Fatty liver    Low carb diet and exercise.  Follow liver function tests.        Hyperglycemia - Primary    Low carb diet and exercise. On victoza.  Follow met b and a1c.       Relevant Orders   Hemoglobin A1c (Completed)   Hypertension    Continue metoprolol.  Pressures under good control.  Follow pressures.  Follow metabolic panel.       Hypertriglyceridemia    Low carb diet and exercise.  Follow lipid panel.  On crestor      Relevant Orders   Hepatic function panel (Completed)   Lipid panel (Completed)   Metabolic syndrome    Low carb diet and exercise.  Weight loss.  On victoza.   Follow.            Einar Pheasant, MD

## 2020-05-10 ENCOUNTER — Encounter: Payer: Self-pay | Admitting: Internal Medicine

## 2020-05-10 NOTE — Assessment & Plan Note (Signed)
Low carb diet and exercise.  Follow lipid panel.  On crestor

## 2020-05-10 NOTE — Assessment & Plan Note (Signed)
Followed by Dr Alfonse Alpers.  Avoid antiinflammatories.  Recheck metabolic panel.

## 2020-05-10 NOTE — Assessment & Plan Note (Signed)
Low carb diet and exercise.  Weight loss.  On victoza.  Follow.

## 2020-05-10 NOTE — Assessment & Plan Note (Signed)
Continue metoprolol.  Pressures under good control.  Follow pressures.  Follow metabolic panel.

## 2020-05-10 NOTE — Assessment & Plan Note (Signed)
Has been worked up by GI.  S/p liver biopsy.  Discussed diet and exercise.  Weight loss.  Follow liver function tests.

## 2020-05-10 NOTE — Assessment & Plan Note (Signed)
Low carb diet and exercise. On victoza.  Follow met b and a1c.

## 2020-05-10 NOTE — Assessment & Plan Note (Signed)
Low carb diet and exercise.  Follow liver function tests.

## 2020-05-27 ENCOUNTER — Other Ambulatory Visit: Payer: Self-pay | Admitting: Internal Medicine

## 2020-05-27 DIAGNOSIS — N83209 Unspecified ovarian cyst, unspecified side: Secondary | ICD-10-CM

## 2020-06-03 ENCOUNTER — Ambulatory Visit
Admission: RE | Admit: 2020-06-03 | Discharge: 2020-06-03 | Disposition: A | Discharge status: Home / Self Care | Payer: BC Managed Care – PPO | Source: Ambulatory Visit | Attending: Internal Medicine | Admitting: Internal Medicine

## 2020-06-03 ENCOUNTER — Other Ambulatory Visit: Payer: Self-pay

## 2020-06-03 DIAGNOSIS — N83209 Unspecified ovarian cyst, unspecified side: Secondary | ICD-10-CM | POA: Diagnosis not present

## 2020-07-01 ENCOUNTER — Other Ambulatory Visit: Payer: Self-pay | Admitting: Internal Medicine

## 2020-07-01 DIAGNOSIS — R9389 Abnormal findings on diagnostic imaging of other specified body structures: Secondary | ICD-10-CM

## 2020-07-01 NOTE — Progress Notes (Signed)
Order placed for GYN referral.

## 2020-07-13 ENCOUNTER — Other Ambulatory Visit: Payer: Self-pay | Admitting: Internal Medicine

## 2020-09-04 ENCOUNTER — Ambulatory Visit: Payer: BC Managed Care – PPO | Admitting: Internal Medicine

## 2020-09-04 ENCOUNTER — Other Ambulatory Visit: Payer: Self-pay

## 2020-09-04 DIAGNOSIS — N184 Chronic kidney disease, stage 4 (severe): Secondary | ICD-10-CM

## 2020-09-04 DIAGNOSIS — K760 Fatty (change of) liver, not elsewhere classified: Secondary | ICD-10-CM | POA: Diagnosis not present

## 2020-09-04 DIAGNOSIS — D649 Anemia, unspecified: Secondary | ICD-10-CM

## 2020-09-04 DIAGNOSIS — N83209 Unspecified ovarian cyst, unspecified side: Secondary | ICD-10-CM

## 2020-09-04 DIAGNOSIS — K219 Gastro-esophageal reflux disease without esophagitis: Secondary | ICD-10-CM | POA: Diagnosis not present

## 2020-09-04 DIAGNOSIS — E781 Pure hyperglyceridemia: Secondary | ICD-10-CM

## 2020-09-04 DIAGNOSIS — I10 Essential (primary) hypertension: Secondary | ICD-10-CM | POA: Diagnosis not present

## 2020-09-04 DIAGNOSIS — E8881 Metabolic syndrome: Secondary | ICD-10-CM

## 2020-09-04 DIAGNOSIS — R739 Hyperglycemia, unspecified: Secondary | ICD-10-CM

## 2020-09-04 NOTE — Progress Notes (Signed)
Patient ID: Angel Collins, female   DOB: [birth year], 60 y.o.   MRN: [id number]   Subjective:    Patient ID: Angel Collins, female    DOB: Feb 28, 1961, 60 y.o.   MRN: [id number]  HPI This visit occurred during the SARS-CoV-2 public health emergency.  Safety protocols were in place, including screening questions prior to the visit, additional usage of staff PPE, and extensive cleaning of exam room while observing appropriate contact time as indicated for disinfecting solutions.  Patient here for scheduled follow up.  Here to follow up regarding her blood pressure, cholesterol and kidney function.  She reports she is doing well.  Feels good.  Trying to stay active.  Discussed diet and exercise.  No chest pain or sob reported.  No abdominal pain or bowel change reported.  bllod pressure doing well.  Seeing nephrology.  Kidney function stable - last check.  Due to see Dr Leafy Ro - f/u ovarian cyst.    Past Medical History:  Diagnosis Date  . Abnormal liver function    fatty liver  . 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 Gout   . Hypercholesterolemia   . Hyperglycemia   . Hypertension    Past Surgical History:  Procedure Laterality Date  . CESAREAN SECTION  1983  . ovarian cyst removed  1979  . TUBAL LIGATION  1987   Family History  Problem Relation Age of Onset  . Lung cancer Mother        died age 48  . Liver cancer Mother   . Liver cancer Father   . Hypertension Brother   . Hypercholesterolemia Brother   . Gout Brother   . Breast cancer Neg Hx    Social History   Socioeconomic History  . Marital status: Married    Spouse name: Not on file  . Number of children: 3  . Years of education: Not on file  . Highest education level: Not on file  Occupational History  . Not on file  Tobacco Use  . Smoking status: Never Smoker  . Smokeless tobacco: Never Used  Substance and Sexual Activity  . Alcohol use: Yes    Alcohol/week: 0.0 standard drinks     Comment: occasional  . Drug use: No  . Sexual activity: Not on file  Other Topics Concern  . Not on file  Social History Narrative  . Not on file   Social Determinants of Health   Financial Resource Strain: Not on file  Food Insecurity: Not on file  Transportation Needs: Not on file  Physical Activity: Not on file  Stress: Not on file  Social Connections: Not on file    Outpatient Encounter Medications as of 09/04/2020  Medication Sig  . aspirin 81 MG tablet Take 81 mg by mouth daily.  . BD PEN NEEDLE NANO 2ND GEN 32G X 4 MM MISC INJECT 1 APPLICATION INTO THE SKIN AS DIRECTED. WITH VICTOZA  . famotidine (PEPCID) 20 MG tablet Take 20 mg by mouth 2 (two) times daily.   . febuxostat (ULORIC) 40 MG tablet TAKE 1 TABLET BY MOUTH EVERY DAY  . furosemide (LASIX) 40 MG tablet Take 40 mg by mouth daily.  . metoprolol tartrate (LOPRESSOR) 50 MG tablet TAKE 1 TABLET (50 MG TOTAL) BY MOUTH 2 (TWO) TIMES DAILY.  . rosuvastatin (CRESTOR) 5 MG tablet TAKE 1 TABLET BY MOUTH EVERY DAY  . VICTOZA 18 MG/3ML SOPN INJECT 0.6 MG UNDER THE SKIN ONCE DAILY (Patient  taking differently: 1.2 mg. )  . vitamin E 400 UNIT capsule Take 400 Units by mouth 2 (two) times daily.  . [DISCONTINUED] omega-3 acid ethyl esters (LOVAZA) 1 g capsule TAKE 1 CAPSULE BY MOUTH FOUR TIMES A DAY (Patient not taking: No sig reported)   No facility-administered encounter medications on file as of 09/04/2020.    Review of Systems  Constitutional: Negative for appetite change and unexpected weight change.  HENT: Negative for congestion and sinus pressure.   Respiratory: Negative for cough, chest tightness and shortness of breath.   Cardiovascular: Negative for chest pain, palpitations and leg swelling.  Gastrointestinal: Negative for abdominal pain, diarrhea, nausea and vomiting.  Genitourinary: Negative for difficulty urinating and dysuria.  Musculoskeletal: Negative for joint swelling and myalgias.  Skin: Negative for color  change and rash.  Neurological: Negative for dizziness, light-headedness and headaches.  Psychiatric/Behavioral: Negative for agitation and dysphoric mood.       Objective:    Physical Exam Vitals reviewed.  Constitutional:      General: She is not in acute distress.    Appearance: Normal appearance.  HENT:     Head: Normocephalic and atraumatic.     Right Ear: External ear normal.     Left Ear: External ear normal.  Eyes:     General: No scleral icterus.       Right eye: No discharge.        Left eye: No discharge.     Conjunctiva/sclera: Conjunctivae normal.  Neck:     Thyroid: No thyromegaly.  Cardiovascular:     Rate and Rhythm: Normal rate and regular rhythm.  Pulmonary:     Effort: No respiratory distress.     Breath sounds: Normal breath sounds. No wheezing.  Abdominal:     General: Bowel sounds are normal.     Palpations: Abdomen is soft.     Tenderness: There is no abdominal tenderness.  Musculoskeletal:        General: No swelling or tenderness.     Cervical back: Neck supple. No tenderness.  Lymphadenopathy:     Cervical: No cervical adenopathy.  Skin:    Findings: No erythema or rash.  Neurological:     Mental Status: She is alert.  Psychiatric:        Mood and Affect: Mood normal.        Behavior: Behavior normal.     BP 128/80   Pulse 80   Temp (!) 96.7 F (35.9 C)   Resp 16   Ht '5\' 1"'  (1.549 m)   Wt 175 lb (79.4 kg)   SpO2 98%   BMI 33.07 kg/m  Wt Readings from Last 3 Encounters:  09/04/20 175 lb (79.4 kg)  05/07/20 173 lb 6.4 oz (78.7 kg)  01/01/20 171 lb (77.6 kg)     Lab Results  Component Value Date   WBC 8.2 01/29/2020   HGB 12.6 01/29/2020   HCT 38.3 01/29/2020   PLT 188.0 01/29/2020   GLUCOSE 100 (H) 05/07/2020   CHOL 157 05/07/2020   TRIG 272.0 (H) 05/07/2020   HDL 46.30 05/07/2020   LDLDIRECT 67.0 05/07/2020   LDLCALC 44 01/22/2020   ALT 56 (H) 05/07/2020   AST 50 (H) 05/07/2020   NA 138 05/07/2020   K 4.1  05/07/2020   CL 103 05/07/2020   CREATININE 2.49 (H) 05/07/2020   BUN 63 (H) 05/07/2020   CO2 25 05/07/2020   TSH 1.25 01/22/2020   HGBA1C 6.0 05/07/2020  US PELVIC COMPLETE WITH TRANSVAGINAL  Result Date: 06/04/2020 CLINICAL DATA:  History of ovarian cysts EXAM: TRANSABDOMINAL AND TRANSVAGINAL ULTRASOUND OF PELVIS TECHNIQUE: Both transabdominal and transvaginal ultrasound examinations of the pelvis were performed. Transabdominal technique was performed for global imaging of the pelvis including uterus, ovaries, adnexal regions, and pelvic cul-de-sac. It was necessary to proceed with endovaginal exam following the transabdominal exam to visualize the uterus endometrium ovaries. COMPARISON:  01/15/2014 FINDINGS: Uterus Measurements: 5.9 x 2.9 x 3.4 cm = volume: 67.5 mL. Negative for focal mass. Endometrium Thickness: 5.8 mm. Focal cystic area within the lower uterine segment measuring 2 x 1.2 x 1 cm. Right ovary Not seen Left ovary Measurements: 2.9 x 1.8 x 2.2 cm = volume: 6 mL. Anechoic cyst measuring 2 cm. Other findings No abnormal free fluid. IMPRESSION: 1. 2 cm left ovarian cyst. No followup imaging recommended. Note: This recommendation does not apply to premenarchal patients or to those with increased risk (genetic, family history, elevated tumor markers or other high-risk factors) of ovarian cancer. Reference: Radiology 2019 Nov; 293(2):359-371. 2. 2 cm cystic area within the lower uterine segment endometrium. A focal endometrial lesion is suspected. Consider sonohysterogram for further evaluation, prior to hysteroscopy or endometrial biopsy. 3. Nonvisualized right ovary Electronically Signed   By: Donavan Foil M.D.   On: 06/04/2020 20:08       Assessment & Plan:   Problem List Items Addressed This Visit    Anemia    Follow cbc.       Chronic kidney disease (CKD), stage IV (severe) (HCC)    Followed by Dr Alfonse Alpers.  Avoid antiinflammatories.  Has been stable.  Recheck metabolic panel.        Fatty liver    Diet, exercise and weight loss.  Follow liver function tests.        Relevant Orders   Hepatic function panel   GERD (gastroesophageal reflux disease)    Acid reflux controlled.  Omeprazole.       Hyperglycemia    Low carb diet and exercise.  Follow met b and a1c.       Relevant Orders   Hemoglobin A1c   Hypertension    Blood pressure doing well.  Continue metoprolol.  Follow pressures.  Follow metabolic panel.       Relevant Orders   Basic metabolic panel   Hypertriglyceridemia    Low carb diet and exercise.  Follow lipid panel and liver function tests.       Relevant Orders   Lipid panel   Metabolic syndrome    Low carb diet, exercise and weight loss.  Follow.       Ovarian cyst    Due to see Dr Leafy Ro.  F/u ultrasound as outlined.  Keep f/u.           Einar Pheasant, MD

## 2020-09-05 ENCOUNTER — Encounter: Payer: Self-pay | Admitting: Internal Medicine

## 2020-09-05 NOTE — Assessment & Plan Note (Signed)
Low carb diet and exercise.  Follow lipid panel and liver function tests.

## 2020-09-05 NOTE — Assessment & Plan Note (Signed)
Followed by Dr Alfonse Alpers.  Avoid antiinflammatories.  Has been stable.  Recheck metabolic panel.

## 2020-09-05 NOTE — Assessment & Plan Note (Signed)
Low carb diet, exercise and weight loss.  Follow.

## 2020-09-05 NOTE — Assessment & Plan Note (Signed)
Blood pressure doing well.  Continue metoprolol.  Follow pressures.  Follow metabolic panel.

## 2020-09-05 NOTE — Assessment & Plan Note (Signed)
Acid reflux controlled.  Omeprazole.

## 2020-09-05 NOTE — Assessment & Plan Note (Signed)
Low carb diet and exercise.  Follow met b and a1c.  

## 2020-09-05 NOTE — Assessment & Plan Note (Signed)
Due to see Dr Leafy Ro.  F/u ultrasound as outlined.  Keep f/u.

## 2020-09-05 NOTE — Assessment & Plan Note (Signed)
Follow cbc.  

## 2020-09-05 NOTE — Assessment & Plan Note (Signed)
Diet, exercise and weight loss. Follow liver function tests.   

## 2020-10-09 ENCOUNTER — Other Ambulatory Visit: Payer: Self-pay | Admitting: Internal Medicine

## 2020-11-21 ENCOUNTER — Other Ambulatory Visit: Payer: Self-pay | Admitting: Internal Medicine

## 2020-12-23 ENCOUNTER — Other Ambulatory Visit: Payer: Self-pay

## 2020-12-23 ENCOUNTER — Other Ambulatory Visit (INDEPENDENT_AMBULATORY_CARE_PROVIDER_SITE_OTHER): Payer: BC Managed Care – PPO

## 2020-12-23 DIAGNOSIS — I10 Essential (primary) hypertension: Secondary | ICD-10-CM

## 2020-12-23 DIAGNOSIS — E781 Pure hyperglyceridemia: Secondary | ICD-10-CM

## 2020-12-23 DIAGNOSIS — R739 Hyperglycemia, unspecified: Secondary | ICD-10-CM | POA: Diagnosis not present

## 2020-12-23 DIAGNOSIS — K760 Fatty (change of) liver, not elsewhere classified: Secondary | ICD-10-CM | POA: Diagnosis not present

## 2020-12-23 LAB — BASIC METABOLIC PANEL
BUN: 61 mg/dL — ABNORMAL HIGH (ref 6–23)
CO2: 24 mEq/L (ref 19–32)
Calcium: 9.9 mg/dL (ref 8.4–10.5)
Chloride: 102 mEq/L (ref 96–112)
Creatinine, Ser: 2.72 mg/dL — ABNORMAL HIGH (ref 0.40–1.20)
GFR: 18.24 mL/min — ABNORMAL LOW (ref >60.00)
Glucose, Bld: 107 mg/dL — ABNORMAL HIGH (ref 70–99)
Potassium: 4 mEq/L (ref 3.5–5.1)
Sodium: 137 mEq/L (ref 135–145)

## 2020-12-23 LAB — HEPATIC FUNCTION PANEL
ALT: 47 U/L — ABNORMAL HIGH (ref 0–35)
AST: 44 U/L — ABNORMAL HIGH (ref 0–37)
Albumin: 4.1 g/dL (ref 3.5–5.2)
Alkaline Phosphatase: 128 U/L — ABNORMAL HIGH (ref 39–117)
Bilirubin, Direct: 0.1 mg/dL (ref 0.0–0.3)
Total Bilirubin: 0.6 mg/dL (ref 0.2–1.2)
Total Protein: 7.4 g/dL (ref 6.0–8.3)

## 2020-12-23 LAB — LIPID PANEL
Cholesterol: 164 mg/dL (ref 0–200)
HDL: 52 mg/dL (ref >39.00)
NonHDL: 111.85
Total CHOL/HDL Ratio: 3
Triglycerides: 297 mg/dL — ABNORMAL HIGH (ref 0.0–149.0)
VLDL: 59.4 mg/dL — ABNORMAL HIGH (ref 0.0–40.0)

## 2020-12-23 LAB — LDL CHOLESTEROL, DIRECT: Direct LDL: 62 mg/dL

## 2020-12-23 LAB — HEMOGLOBIN A1C: Hgb A1c MFr Bld: 6 % (ref 4.6–6.5)

## 2020-12-25 ENCOUNTER — Other Ambulatory Visit: Payer: Self-pay

## 2020-12-25 ENCOUNTER — Encounter: Payer: Self-pay | Admitting: Internal Medicine

## 2020-12-25 ENCOUNTER — Ambulatory Visit (INDEPENDENT_AMBULATORY_CARE_PROVIDER_SITE_OTHER): Payer: BC Managed Care – PPO | Admitting: Internal Medicine

## 2020-12-25 VITALS — BP 120/80 | HR 70 | Temp 98.9°F | Ht 61.0 in | Wt 169.2 lb

## 2020-12-25 DIAGNOSIS — K219 Gastro-esophageal reflux disease without esophagitis: Secondary | ICD-10-CM

## 2020-12-25 DIAGNOSIS — N184 Chronic kidney disease, stage 4 (severe): Secondary | ICD-10-CM

## 2020-12-25 DIAGNOSIS — I10 Essential (primary) hypertension: Secondary | ICD-10-CM

## 2020-12-25 DIAGNOSIS — Z1231 Encounter for screening mammogram for malignant neoplasm of breast: Secondary | ICD-10-CM | POA: Diagnosis not present

## 2020-12-25 DIAGNOSIS — E781 Pure hyperglyceridemia: Secondary | ICD-10-CM

## 2020-12-25 DIAGNOSIS — Z23 Encounter for immunization: Secondary | ICD-10-CM | POA: Diagnosis not present

## 2020-12-25 DIAGNOSIS — H938X2 Other specified disorders of left ear: Secondary | ICD-10-CM

## 2020-12-25 DIAGNOSIS — R7989 Other specified abnormal findings of blood chemistry: Secondary | ICD-10-CM

## 2020-12-25 DIAGNOSIS — Z0000 Encounter for general adult medical examination without abnormal findings: Secondary | ICD-10-CM | POA: Diagnosis not present

## 2020-12-25 DIAGNOSIS — K760 Fatty (change of) liver, not elsewhere classified: Secondary | ICD-10-CM

## 2020-12-25 DIAGNOSIS — R945 Abnormal results of liver function studies: Secondary | ICD-10-CM

## 2020-12-25 DIAGNOSIS — E8881 Metabolic syndrome: Secondary | ICD-10-CM

## 2020-12-25 DIAGNOSIS — N83209 Unspecified ovarian cyst, unspecified side: Secondary | ICD-10-CM

## 2020-12-25 DIAGNOSIS — R739 Hyperglycemia, unspecified: Secondary | ICD-10-CM

## 2020-12-25 DIAGNOSIS — D649 Anemia, unspecified: Secondary | ICD-10-CM

## 2020-12-25 NOTE — Progress Notes (Signed)
Patient ID: Angel Collins, female   DOB: Jul 02, 1960, 60 y.o.   MRN: [id number]   Subjective:    Patient ID: Angel Collins, female    DOB: 04-13-1960, 60 y.o.   MRN: [id number]  This visit occurred during the SARS-CoV-2 public health emergency.  Safety protocols were in place, including screening questions prior to the visit, additional usage of staff PPE, and extensive cleaning of exam room while observing appropriate contact time as indicated for disinfecting solutions.   Patient here for  Chief Complaint  Patient presents with   Annual Exam   .   HPI States she is doing well.  Feels good.  Increased stress recently with her job, but reports things are better now.  Overall she feels she is handling things relatively well now.  No chest pain or sob reported.  Has started walking daily.  Saw Dr Leafy Ro.  S/p pelvic ultrasound and endometrial biopsy.  States everything ok.  No abdominal pain.  Bowels moving.  Has noticed left ear popping.  No hearing change.    Past Medical History:  Diagnosis Date   Abnormal liver function    fatty liver   Chronic kidney disease    medullary cystic kidney disease   Gout    Hypercholesterolemia    Hyperglycemia    Hypertension    Past Surgical History:  Procedure Laterality Date   CESAREAN SECTION  1983   ovarian cyst removed  1979   TUBAL LIGATION  1987   Family History  Problem Relation Age of Onset   Lung cancer Mother        died age 26   Liver cancer Mother    Liver cancer Father    Hypertension Brother    Hypercholesterolemia Brother    Gout Brother    Breast cancer Neg Hx    Social History   Socioeconomic History   Marital status: Married    Spouse name: Not on file   Number of children: 3   Years of education: Not on file   Highest education level: Not on file  Occupational History   Not on file  Tobacco Use   Smoking status: Never   Smokeless tobacco: Never  Substance and Sexual Activity   Alcohol use: Yes     Alcohol/week: 0.0 standard drinks    Comment: occasional   Drug use: No   Sexual activity: Not on file  Other Topics Concern   Not on file  Social History Narrative   Not on file   Social Determinants of Health   Financial Resource Strain: Not on file  Food Insecurity: Not on file  Transportation Needs: Not on file  Physical Activity: Not on file  Stress: Not on file  Social Connections: Not on file     Review of Systems  Constitutional:  Negative for appetite change and unexpected weight change.  HENT:  Negative for congestion and sinus pressure.   Respiratory:  Negative for cough, chest tightness and shortness of breath.   Cardiovascular:  Negative for chest pain, palpitations and leg swelling.  Gastrointestinal:  Negative for abdominal pain, diarrhea, nausea and vomiting.  Genitourinary:  Negative for difficulty urinating and dysuria.  Musculoskeletal:  Negative for joint swelling and myalgias.  Skin:  Negative for color change and rash.  Neurological:  Negative for dizziness, light-headedness and headaches.  Psychiatric/Behavioral:  Negative for agitation and dysphoric mood.       Objective:     BP 120/80   Pulse 70  Temp 98.9 F (37.2 C) (Oral)   Ht _0  (1.549 m)   Wt 169 lb 3.2 oz (76.7 kg)   SpO2 96%   BMI 31.97 kg/m  Wt Readings from Last 3 Encounters:  12/25/20 169 lb 3.2 oz (76.7 kg)  09/04/20 175 lb (79.4 kg)  05/07/20 173 lb 6.4 oz (78.7 kg)    Physical Exam Vitals reviewed.  Constitutional:      General: She is not in acute distress.    Appearance: Normal appearance. She is well-developed.  HENT:     Head: Normocephalic and atraumatic.     Right Ear: External ear normal.     Left Ear: External ear normal.  Eyes:     General: No scleral icterus.       Right eye: No discharge.        Left eye: No discharge.     Conjunctiva/sclera: Conjunctivae normal.  Neck:     Thyroid: No thyromegaly.  Cardiovascular:     Rate and Rhythm: Normal rate  and regular rhythm.  Pulmonary:     Effort: No tachypnea, accessory muscle usage or respiratory distress.     Breath sounds: Normal breath sounds. No decreased breath sounds or wheezing.  Chest:  Breasts:    Right: No inverted nipple, mass, nipple discharge or tenderness (no axillary adenopathy).     Left: No inverted nipple, mass, nipple discharge or tenderness (no axilarry adenopathy).  Abdominal:     General: Bowel sounds are normal.     Palpations: Abdomen is soft.     Tenderness: There is no abdominal tenderness.  Musculoskeletal:        General: No swelling or tenderness.     Cervical back: Neck supple. No tenderness.  Lymphadenopathy:     Cervical: No cervical adenopathy.  Skin:    Findings: No erythema or rash.  Neurological:     Mental Status: She is alert and oriented to person, place, and time.  Psychiatric:        Mood and Affect: Mood normal.        Behavior: Behavior normal.     Outpatient Encounter Medications as of 12/25/2020  Medication Sig   aspirin 81 MG tablet Take 81 mg by mouth daily.   BD PEN NEEDLE NANO 2ND GEN 32G X 4 MM MISC INJECT 1 APPLICATION INTO THE SKIN AS DIRECTED. WITH VICTOZA   famotidine (PEPCID) 20 MG tablet Take 20 mg by mouth 2 (two) times daily.    febuxostat (ULORIC) 40 MG tablet TAKE 1 TABLET BY MOUTH EVERY DAY   furosemide (LASIX) 40 MG tablet Take 40 mg by mouth daily.   metoprolol tartrate (LOPRESSOR) 50 MG tablet TAKE 1 TABLET (50 MG TOTAL) BY MOUTH 2 (TWO) TIMES DAILY.   rosuvastatin (CRESTOR) 5 MG tablet TAKE 1 TABLET BY MOUTH EVERY DAY   VICTOZA 18 MG/3ML SOPN INJECT 0.6 MG UNDER THE SKIN ONCE DAILY (Patient taking differently: 1.2 mg.)   vitamin E 400 UNIT capsule Take 400 Units by mouth 2 (two) times daily.   No facility-administered encounter medications on file as of 12/25/2020.     Lab Results  Component Value Date   WBC 8.2 01/29/2020   HGB 12.6 01/29/2020   HCT 38.3 01/29/2020   PLT 188.0 01/29/2020   GLUCOSE 107  (H) 12/23/2020   CHOL 164 12/23/2020   TRIG 297.0 (H) 12/23/2020   HDL 52.00 12/23/2020   LDLDIRECT 62.0 12/23/2020   LDLCALC 44 01/22/2020   ALT 47 (H)  12/23/2020   AST 44 (H) 12/23/2020   NA 137 12/23/2020   K 4.0 12/23/2020   CL 102 12/23/2020   CREATININE 2.72 (H) 12/23/2020   BUN 61 (H) 12/23/2020   CO2 24 12/23/2020   TSH 1.25 01/22/2020   HGBA1C 6.0 12/23/2020    US PELVIC COMPLETE WITH TRANSVAGINAL  Result Date: 06/04/2020 CLINICAL DATA:  History of ovarian cysts EXAM: TRANSABDOMINAL AND TRANSVAGINAL ULTRASOUND OF PELVIS TECHNIQUE: Both transabdominal and transvaginal ultrasound examinations of the pelvis were performed. Transabdominal technique was performed for global imaging of the pelvis including uterus, ovaries, adnexal regions, and pelvic cul-de-sac. It was necessary to proceed with endovaginal exam following the transabdominal exam to visualize the uterus endometrium ovaries. COMPARISON:  01/15/2014 FINDINGS: Uterus Measurements: 5.9 x 2.9 x 3.4 cm = volume: 67.5 mL. Negative for focal mass. Endometrium Thickness: 5.8 mm. Focal cystic area within the lower uterine segment measuring 2 x 1.2 x 1 cm. Right ovary Not seen Left ovary Measurements: 2.9 x 1.8 x 2.2 cm = volume: 6 mL. Anechoic cyst measuring 2 cm. Other findings No abnormal free fluid. IMPRESSION: 1. 2 cm left ovarian cyst. No followup imaging recommended. Note: This recommendation does not apply to premenarchal patients or to those with increased risk (genetic, family history, elevated tumor markers or other high-risk factors) of ovarian cancer. Reference: Radiology 2019 Nov; 293(2):359-371. 2. 2 cm cystic area within the lower uterine segment endometrium. A focal endometrial lesion is suspected. Consider sonohysterogram for further evaluation, prior to hysteroscopy or endometrial biopsy. 3. Nonvisualized right ovary Electronically Signed   By: Donavan Foil M.D.   On: 06/04/2020 20:08       Assessment & Plan:    Problem List Items Addressed This Visit     Abnormal liver function tests    Has been worked up by GI.  S/p liver biopsy.  Discussed diet and exercise.  Weight loss.  Follow liver function tests.  Recent liver function tests elevated.  Refer back to GI for further evaluation and treatment.       Anemia    Follow cbc.       Chronic kidney disease (CKD), stage IV (severe) (HCC)    Followed by Dr Alfonse Alpers.  GFR 18.  Has f/u soon with nephrology.  Avoid nephrotoxic medications.  Follow.       Ear popping, left    TM clear.  No cerumen impaction.  Steroid nasal spray as directed.  Follow.        Fatty liver    Diet, exercise and weight loss.  Follow liver function tests.        Relevant Orders   Hepatic function panel   GERD (gastroesophageal reflux disease)    Acid reflux controlled.  Omeprazole.       Health care maintenance    Physical today 12/25/20.  Just saw gyn.  Colonoscopy 01/2019.  Recommended f/u in 3 years.  Mammogram 01/17/20 - [facility].  Schedule f/u mammogram.       Hyperglycemia    Low carb diet and exercise.  Follow met b and a1c.       Relevant Orders   Hemoglobin A1c   Hypertension    Blood pressure doing well.  Continue metoprolol.  Follow pressures.  Follow metabolic panel.       Relevant Orders   Basic metabolic panel   Hypertriglyceridemia    Low carb diet and exercise.  Follow lipid panel and liver function tests.  Relevant Orders   CBC with Differential/Platelet   TSH   Lipid panel   Metabolic syndrome    Low carb diet and weight loss.  Follow.       Ovarian cyst    Saw Dr Leafy Ro.  States everything checked out ok.  Follow.        Other Visit Diagnoses     Routine general medical examination at a health care facility    -  Primary   Encounter for screening mammogram for malignant neoplasm of breast       Relevant Orders   MM 3D SCREEN BREAST BILATERAL   Need for immunization against influenza       Relevant Orders   Flu  Vaccine QUAD 41moIM (Fluarix, Fluzone & Alfiuria Quad PF) (Completed)        CEinar Pheasant MD

## 2020-12-25 NOTE — Assessment & Plan Note (Addendum)
Physical today 12/25/20.  Just saw gyn.  Colonoscopy 01/2019.  Recommended f/u in 3 years.  Mammogram 01/17/20 - [facility].  Schedule f/u mammogram.

## 2020-12-25 NOTE — Patient Instructions (Signed)
Nasacort nasal spray - 2 sprays each nostril one time per day.  Do this in the evening.   

## 2021-01-02 ENCOUNTER — Other Ambulatory Visit: Payer: Self-pay | Admitting: Internal Medicine

## 2021-01-02 ENCOUNTER — Encounter: Payer: Self-pay | Admitting: Internal Medicine

## 2021-01-02 ENCOUNTER — Telehealth: Payer: Self-pay | Admitting: Internal Medicine

## 2021-01-02 DIAGNOSIS — H938X2 Other specified disorders of left ear: Secondary | ICD-10-CM | POA: Insufficient documentation

## 2021-01-02 NOTE — Assessment & Plan Note (Signed)
Low carb diet and exercise.  Follow met b and a1c.  

## 2021-01-02 NOTE — Telephone Encounter (Signed)
She has seen [place] previously for abnormal liver function tests.  Needs f/u.  Has not seen since 2020.  Does not need new referral.  Just needs a f/u appt.  Please call and schedule.

## 2021-01-02 NOTE — Assessment & Plan Note (Signed)
Saw Dr Leafy Ro.  States everything checked out ok.  Follow.

## 2021-01-02 NOTE — Assessment & Plan Note (Signed)
Low carb diet and weight loss.  Follow.

## 2021-01-02 NOTE — Assessment & Plan Note (Signed)
Follow cbc.  

## 2021-01-02 NOTE — Assessment & Plan Note (Signed)
Blood pressure doing well.  Continue metoprolol.  Follow pressures.  Follow metabolic panel.

## 2021-01-02 NOTE — Assessment & Plan Note (Addendum)
Has been worked up by GI.  S/p liver biopsy.  Discussed diet and exercise.  Weight loss.  Follow liver function tests.  Recent liver function tests elevated.  Refer back to GI for further evaluation and treatment.

## 2021-01-02 NOTE — Assessment & Plan Note (Signed)
Low carb diet and exercise.  Follow lipid panel and liver function tests.

## 2021-01-02 NOTE — Assessment & Plan Note (Signed)
Followed by Dr Alfonse Alpers.  GFR 18.  Has f/u soon with nephrology.  Avoid nephrotoxic medications.  Follow.

## 2021-01-02 NOTE — Assessment & Plan Note (Signed)
TM clear.  No cerumen impaction.  Steroid nasal spray as directed.  Follow.

## 2021-01-02 NOTE — Assessment & Plan Note (Signed)
Diet, exercise and weight loss. Follow liver function tests.   

## 2021-01-02 NOTE — Assessment & Plan Note (Signed)
Acid reflux controlled.  Omeprazole.

## 2021-01-04 NOTE — Telephone Encounter (Signed)
Called to schedule GI appt. First available is end of December/beginning of January. Ok for her to wait until then or do you want her to be seen sooner?

## 2021-01-05 NOTE — Telephone Encounter (Signed)
Disregard. Called GI back and patient was already scheduled for 10/19 with [facility] in [place]. Will not need labs in 3 weeks.

## 2021-01-05 NOTE — Telephone Encounter (Signed)
Please go ahead and schedule first available appt.  Have her recheck liver panel in 3-4 weeks.

## 2021-01-18 ENCOUNTER — Other Ambulatory Visit: Payer: Self-pay

## 2021-01-18 ENCOUNTER — Ambulatory Visit
Admission: RE | Admit: 2021-01-18 | Discharge: 2021-01-18 | Disposition: A | Discharge status: Home / Self Care | Payer: BC Managed Care – PPO | Source: Ambulatory Visit | Attending: Internal Medicine | Admitting: Internal Medicine

## 2021-01-18 DIAGNOSIS — Z1231 Encounter for screening mammogram for malignant neoplasm of breast: Secondary | ICD-10-CM | POA: Diagnosis not present

## 2021-01-27 ENCOUNTER — Other Ambulatory Visit: Payer: Self-pay | Admitting: Gastroenterology

## 2021-01-27 DIAGNOSIS — R748 Abnormal levels of other serum enzymes: Secondary | ICD-10-CM

## 2021-02-03 ENCOUNTER — Other Ambulatory Visit: Payer: Self-pay

## 2021-02-03 ENCOUNTER — Ambulatory Visit
Admission: RE | Admit: 2021-02-03 | Discharge: 2021-02-03 | Disposition: A | Discharge status: Home / Self Care | Payer: BC Managed Care – PPO | Source: Ambulatory Visit | Attending: Gastroenterology | Admitting: Gastroenterology

## 2021-02-03 DIAGNOSIS — R748 Abnormal levels of other serum enzymes: Secondary | ICD-10-CM | POA: Insufficient documentation

## 2021-02-14 ENCOUNTER — Other Ambulatory Visit: Payer: Self-pay | Admitting: Internal Medicine

## 2021-03-26 ENCOUNTER — Encounter: Payer: Self-pay | Admitting: Internal Medicine

## 2021-03-26 ENCOUNTER — Ambulatory Visit: Payer: BC Managed Care – PPO | Admitting: Internal Medicine

## 2021-03-26 ENCOUNTER — Other Ambulatory Visit: Payer: Self-pay

## 2021-03-26 DIAGNOSIS — K760 Fatty (change of) liver, not elsewhere classified: Secondary | ICD-10-CM

## 2021-03-26 DIAGNOSIS — R7989 Other specified abnormal findings of blood chemistry: Secondary | ICD-10-CM

## 2021-03-26 DIAGNOSIS — K219 Gastro-esophageal reflux disease without esophagitis: Secondary | ICD-10-CM | POA: Diagnosis not present

## 2021-03-26 DIAGNOSIS — I10 Essential (primary) hypertension: Secondary | ICD-10-CM | POA: Diagnosis not present

## 2021-03-26 DIAGNOSIS — D649 Anemia, unspecified: Secondary | ICD-10-CM

## 2021-03-26 DIAGNOSIS — N184 Chronic kidney disease, stage 4 (severe): Secondary | ICD-10-CM

## 2021-03-26 DIAGNOSIS — N83209 Unspecified ovarian cyst, unspecified side: Secondary | ICD-10-CM | POA: Diagnosis not present

## 2021-03-26 DIAGNOSIS — E781 Pure hyperglyceridemia: Secondary | ICD-10-CM

## 2021-03-26 DIAGNOSIS — R739 Hyperglycemia, unspecified: Secondary | ICD-10-CM

## 2021-03-26 DIAGNOSIS — E8881 Metabolic syndrome: Secondary | ICD-10-CM

## 2021-03-26 NOTE — Progress Notes (Signed)
Patient ID: VIRTIE BUNGERT, female   DOB: July 18, 1960, 60 y.o.   MRN: [id number]   Subjective:    Patient ID: [name], female    DOB: January 16, 1961, 60 y.o.   MRN: [id number]  This visit occurred during the SARS-CoV-2 public health emergency.  Safety protocols were in place, including screening questions prior to the visit, additional usage of staff PPE, and extensive cleaning of exam room while observing appropriate contact time as indicated for disinfecting solutions.   Patient here for scheduled follow up.   Chief Complaint  Patient presents with   Hyperlipidemia   Gastroesophageal Reflux   .   HPI Reports she is doing well.  Sees Dr Alfonse Alpers - f/u CKD.  Recent decrease in GFR.  Following.  Trying to stay active.  Discussed diet and exercise.  No chest pain or sob reported.  No cough or congestion.  No acid reflux reported.  No abdominal pain.  Bowels moving.  Discussed due colonoscopy next year. Discussed shingles vaccine and Tdap.     Past Medical History:  Diagnosis Date   Abnormal liver function    fatty liver   Chronic kidney disease    medullary cystic kidney disease   Gout    Hypercholesterolemia    Hyperglycemia    Hypertension    Past Surgical History:  Procedure Laterality Date   CESAREAN SECTION  1983   ovarian cyst removed  1979   TUBAL LIGATION  1987   Family History  Problem Relation Age of Onset   Lung cancer Mother        died age 58   Liver cancer Mother    Liver cancer Father    Hypertension Brother    Hypercholesterolemia Brother    Gout Brother    Breast cancer Neg Hx    Social History   Socioeconomic History   Marital status: Married    Spouse name: Not on file   Number of children: 3   Years of education: Not on file   Highest education level: Not on file  Occupational History   Not on file  Tobacco Use   Smoking status: Never   Smokeless tobacco: Never  Substance and Sexual Activity   Alcohol use: Yes    Alcohol/week: 0.0 standard  drinks    Comment: occasional   Drug use: No   Sexual activity: Not on file  Other Topics Concern   Not on file  Social History Narrative   Not on file   Social Determinants of Health   Financial Resource Strain: Not on file  Food Insecurity: Not on file  Transportation Needs: Not on file  Physical Activity: Not on file  Stress: Not on file  Social Connections: Not on file     Review of Systems  Constitutional:  Negative for appetite change and unexpected weight change.  HENT:  Negative for congestion and sinus pressure.   Respiratory:  Negative for cough, chest tightness and shortness of breath.   Cardiovascular:  Negative for chest pain, palpitations and leg swelling.  Gastrointestinal:  Negative for abdominal pain, diarrhea, nausea and vomiting.  Genitourinary:  Negative for difficulty urinating and dysuria.  Musculoskeletal:  Negative for joint swelling and myalgias.  Skin:  Negative for color change and rash.  Neurological:  Negative for dizziness, light-headedness and headaches.  Psychiatric/Behavioral:  Negative for agitation and dysphoric mood.       Objective:     BP 130/82    Pulse 70  Temp 97.9 F (36.6 C)    Resp 16    Ht _0  (1.549 m)    Wt 172 lb 6.4 oz (78.2 kg)    SpO2 98%    BMI 32.57 kg/m  Wt Readings from Last 3 Encounters:  03/26/21 172 lb 6.4 oz (78.2 kg)  12/25/20 169 lb 3.2 oz (76.7 kg)  09/04/20 175 lb (79.4 kg)    Physical Exam Vitals reviewed.  Constitutional:      General: She is not in acute distress.    Appearance: Normal appearance.  HENT:     Head: Normocephalic and atraumatic.     Right Ear: External ear normal.     Left Ear: External ear normal.  Eyes:     General: No scleral icterus.       Right eye: No discharge.        Left eye: No discharge.     Conjunctiva/sclera: Conjunctivae normal.  Neck:     Thyroid: No thyromegaly.  Cardiovascular:     Rate and Rhythm: Normal rate and regular rhythm.  Pulmonary:     Effort:  No respiratory distress.     Breath sounds: Normal breath sounds. No wheezing.  Abdominal:     General: Bowel sounds are normal.     Palpations: Abdomen is soft.     Tenderness: There is no abdominal tenderness.  Musculoskeletal:        General: No swelling or tenderness.     Cervical back: Neck supple. No tenderness.  Lymphadenopathy:     Cervical: No cervical adenopathy.  Skin:    Findings: No erythema or rash.  Neurological:     Mental Status: She is alert.  Psychiatric:        Mood and Affect: Mood normal.        Behavior: Behavior normal.     Outpatient Encounter Medications as of 03/26/2021  Medication Sig   aspirin 81 MG tablet Take 81 mg by mouth daily.   BD PEN NEEDLE NANO 2ND GEN 32G X 4 MM MISC INJECT 1 APPLICATION INTO THE SKIN AS DIRECTED. WITH VICTOZA   famotidine (PEPCID) 20 MG tablet Take 20 mg by mouth 2 (two) times daily.    febuxostat (ULORIC) 40 MG tablet TAKE 1 TABLET BY MOUTH EVERY DAY   furosemide (LASIX) 40 MG tablet Take 40 mg by mouth daily.   metoprolol tartrate (LOPRESSOR) 50 MG tablet TAKE 1 TABLET (50 MG TOTAL) BY MOUTH 2 (TWO) TIMES DAILY.   rosuvastatin (CRESTOR) 5 MG tablet TAKE 1 TABLET BY MOUTH EVERY DAY   VICTOZA 18 MG/3ML SOPN INJECT 0.6 MG UNDER THE SKIN ONCE DAILY (Patient taking differently: 1.2 mg.)   vitamin E 400 UNIT capsule Take 400 Units by mouth 2 (two) times daily.   No facility-administered encounter medications on file as of 03/26/2021.     Lab Results  Component Value Date   WBC 8.2 01/29/2020   HGB 12.6 01/29/2020   HCT 38.3 01/29/2020   PLT 188.0 01/29/2020   GLUCOSE 107 (H) 12/23/2020   CHOL 164 12/23/2020   TRIG 297.0 (H) 12/23/2020   HDL 52.00 12/23/2020   LDLDIRECT 62.0 12/23/2020   LDLCALC 44 01/22/2020   ALT 47 (H) 12/23/2020   AST 44 (H) 12/23/2020   NA 137 12/23/2020   K 4.0 12/23/2020   CL 102 12/23/2020   CREATININE 2.72 (H) 12/23/2020   BUN 61 (H) 12/23/2020   CO2 24 12/23/2020   TSH 1.25  01/22/2020  HGBA1C 6.0 12/23/2020    US Abdomen Complete  Result Date: 02/04/2021 CLINICAL DATA:  Elevated liver enzymes. EXAM: ABDOMEN ULTRASOUND COMPLETE COMPARISON:  Ultrasound dated 02/11/2015. FINDINGS: Gallbladder: No gallstones or wall thickening visualized. No sonographic Murphy sign noted by sonographer. Common bile duct: Diameter: 4 mm Liver: There is diffuse increased liver echogenicity most commonly seen in the setting of fatty infiltration. Superimposed inflammation or fibrosis is not excluded. Clinical correlation is recommended. Portal vein is patent on color Doppler imaging with normal direction of blood flow towards the liver. IVC: No abnormality visualized. Pancreas: Visualized portion unremarkable. Spleen: Size and appearance within normal limits. Right Kidney: Length: 10.3 cm. There is diffuse increased renal parenchymal echogenicity. No hydronephrosis or shadowing stone. There is a 1 cm upper pole cyst. Left Kidney: Length: 9.1 cm. Moderate parenchyma atrophy. Increased echogenicity. No hydronephrosis or shadowing stone. Abdominal aorta: No aneurysm visualized. Other findings: None. IMPRESSION: 1. Fatty liver. 2. Echogenic kidneys consistent with chronic kidney disease. No hydronephrosis or shadowing stone. Electronically Signed   By: Anner Crete M.D.   On: 02/04/2021 21:46       Assessment & Plan:   Problem List Items Addressed This Visit     Abnormal liver function tests    Has been worked up by GI.  S/p liver biopsy.  Discussed diet and exercise.  Weight loss.  Follow liver function tests.       Anemia    Most recent cbc - normal hgb.  EGD and colonoscopy 01/2019 - results (per overview)      Chronic kidney disease (CKD), stage IV (severe) ([place])    Followed by Dr Alfonse Alpers.  GFR 18.  Has f/u soon with nephrology.  Avoid nephrotoxic medications.  Follow. Recheck met b.       Fatty liver    Diet, exercise and weight loss.  Follow liver function tests.         GERD (gastroesophageal reflux disease)    Acid reflux controlled.  Omeprazole.       Hyperglycemia    Low carb diet and exercise.  Follow met b and a1c.       Hypertension    Blood pressure doing well.  Continue metoprolol.  Follow pressures.  Follow metabolic panel.       Hypertriglyceridemia    Low carb diet and exercise.  Follow lipid panel and liver function tests.       Metabolic syndrome    Low carb diet and weight loss.  Follow.       Ovarian cyst    Reviewed note 09/2020 - per note, f/u ultrasound revealed no ovarian cyst.  S/p endometrial biopsy.  Per pt - ok.  F/u with gyn to confirm no further w/up warranted.         Einar Pheasant, MD

## 2021-03-27 ENCOUNTER — Encounter: Payer: Self-pay | Admitting: Internal Medicine

## 2021-03-27 NOTE — Assessment & Plan Note (Signed)
Diet, exercise and weight loss. Follow liver function tests.   

## 2021-03-27 NOTE — Assessment & Plan Note (Signed)
Low carb diet and weight loss.  Follow.

## 2021-03-27 NOTE — Assessment & Plan Note (Signed)
Acid reflux controlled.  Omeprazole.

## 2021-03-27 NOTE — Assessment & Plan Note (Signed)
Low carb diet and exercise.  Follow met b and a1c.

## 2021-03-27 NOTE — Assessment & Plan Note (Signed)
Most recent cbc - normal hgb.  EGD and colonoscopy 01/2019 - results (per overview)

## 2021-03-27 NOTE — Assessment & Plan Note (Signed)
Followed by Dr Alfonse Alpers.  GFR 18.  Has f/u soon with nephrology.  Avoid nephrotoxic medications.  Follow. Recheck met b.

## 2021-03-27 NOTE — Assessment & Plan Note (Signed)
Reviewed note 09/2020 - per note, f/u ultrasound revealed no ovarian cyst.  S/p endometrial biopsy.  Per pt - ok.  F/u with gyn to confirm no further w/up warranted.

## 2021-03-27 NOTE — Assessment & Plan Note (Signed)
Blood pressure doing well.  Continue metoprolol.  Follow pressures.  Follow metabolic panel.

## 2021-03-27 NOTE — Assessment & Plan Note (Signed)
Has been worked up by GI.  S/p liver biopsy.  Discussed diet and exercise.  Weight loss.  Follow liver function tests.

## 2021-03-27 NOTE — Assessment & Plan Note (Signed)
Low carb diet and exercise.  Follow lipid panel and liver function tests.

## 2021-04-25 IMAGING — MG DIGITAL SCREENING BILAT W/ TOMO W/ CAD
8 series · 8 of 24 positions shown · non-contrast
Comparison: Previous exam(s).

CLINICAL DATA: Screening.

EXAM:
DIGITAL SCREENING BILATERAL MAMMOGRAM WITH TOMO AND CAD

[R MLO synth-2D]
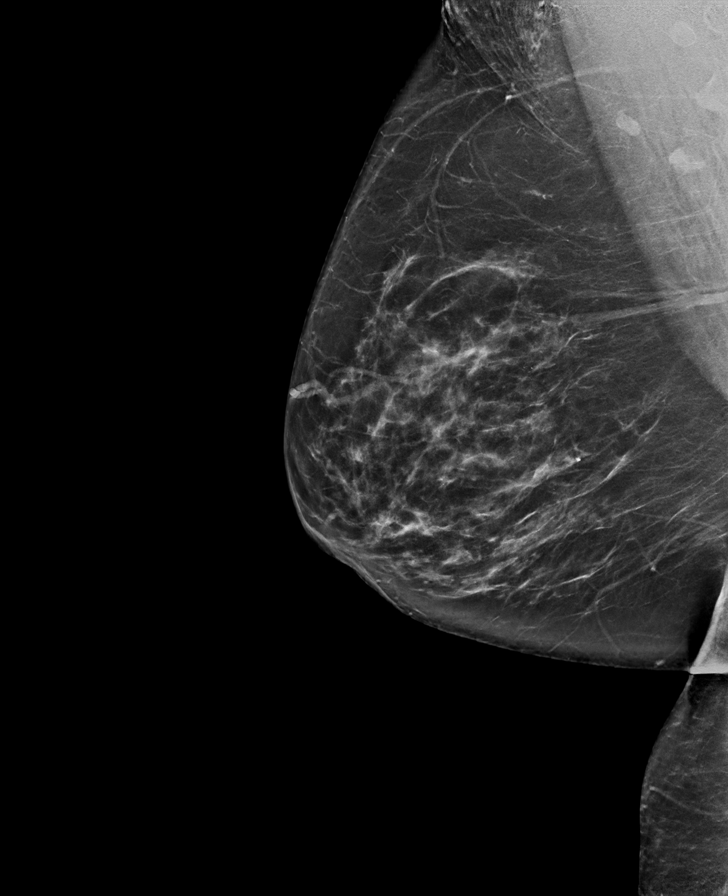

[L CC synth-2D]
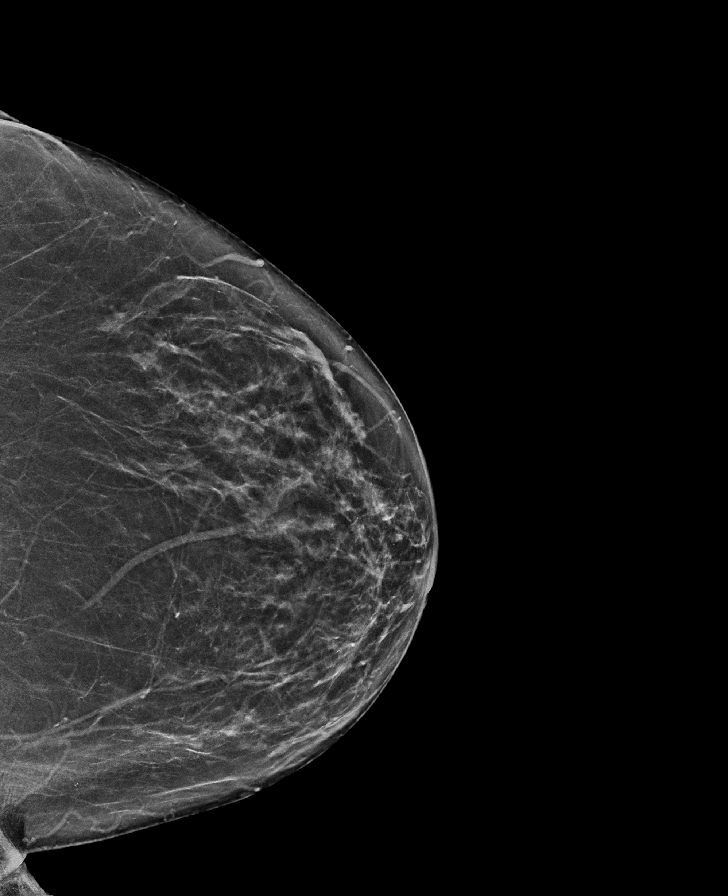

[L MLO synth-2D]
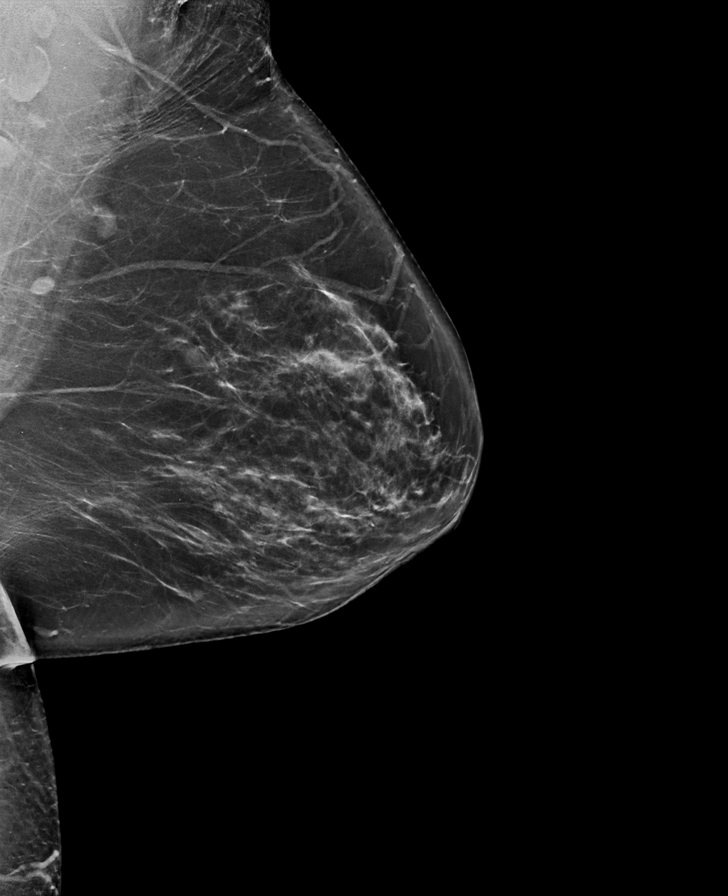

[R CC synth-2D]
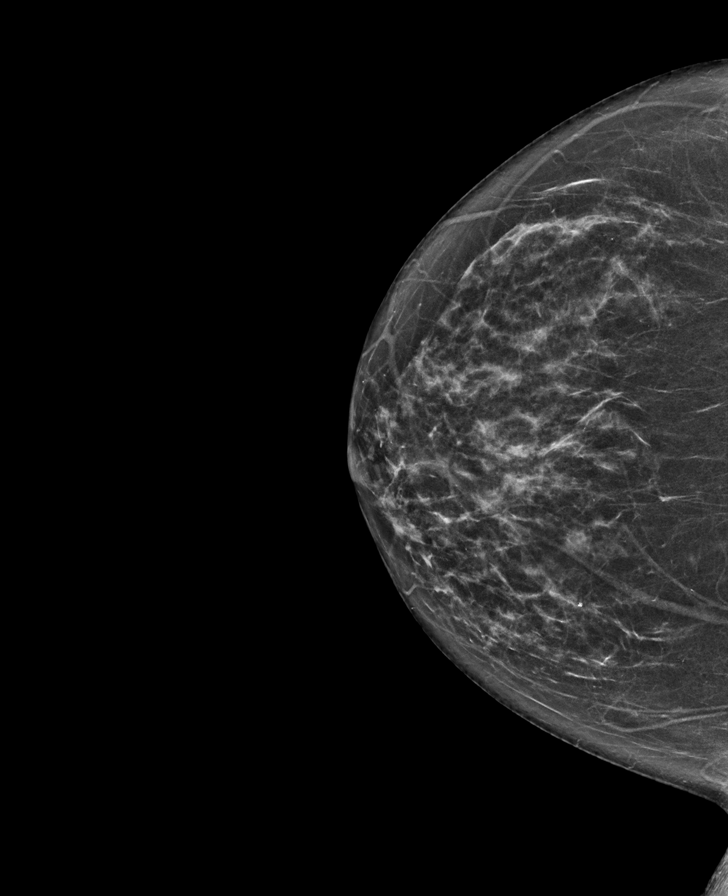

[L CC tomo · tomo slice 35/69.0]
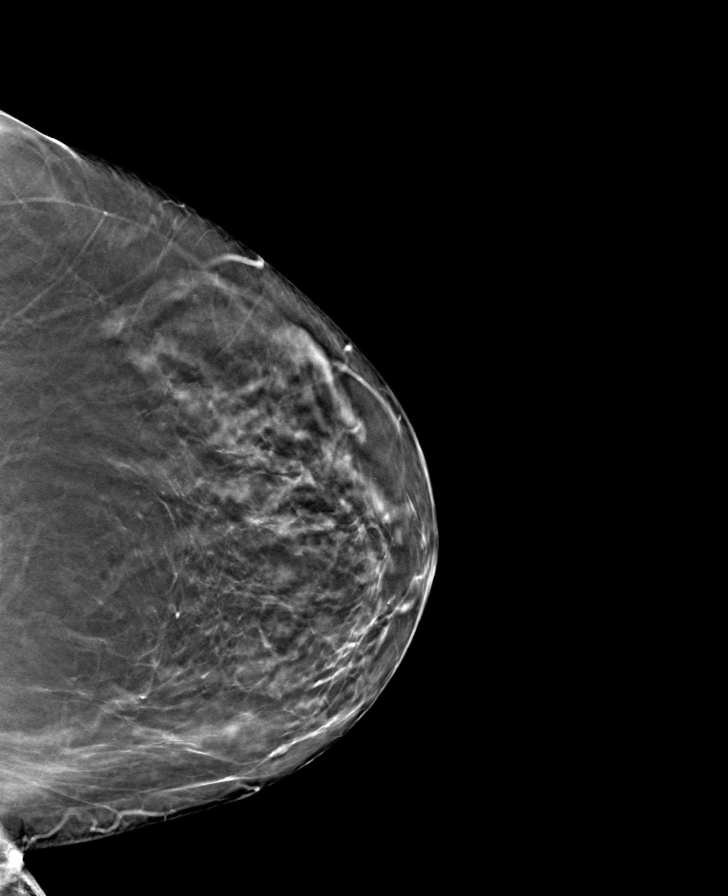

[R MLO tomo · tomo slice 41/81.0]
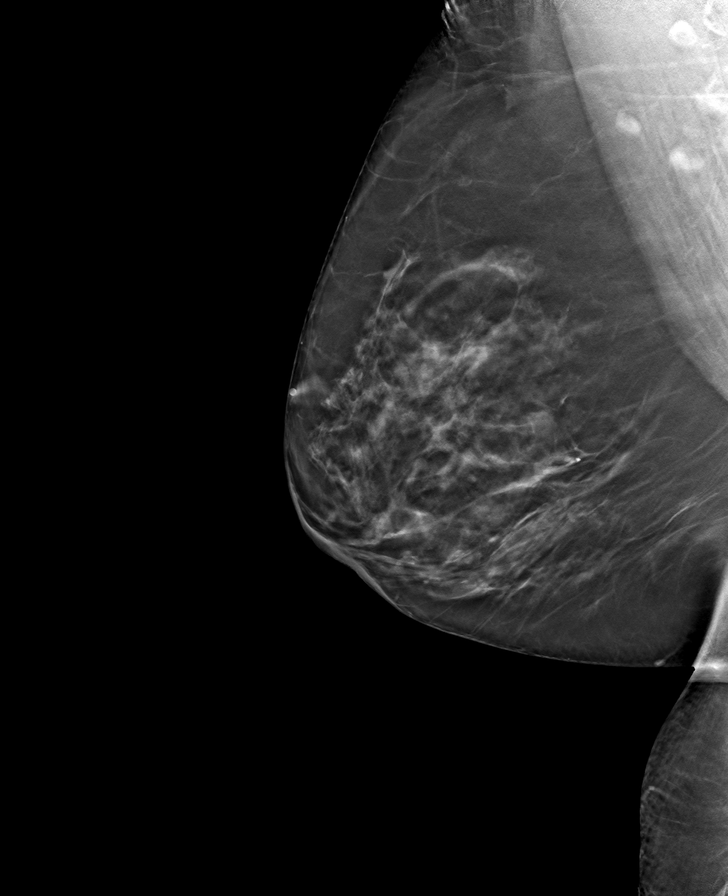

[R CC tomo · tomo slice 33/66.0]
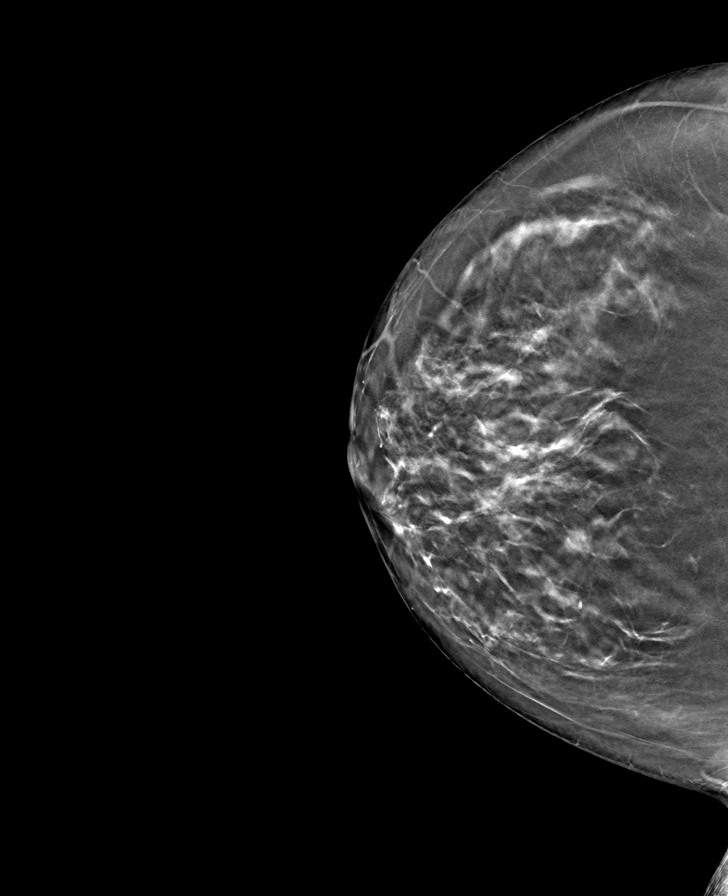

[L MLO tomo · tomo slice 42/83.0]
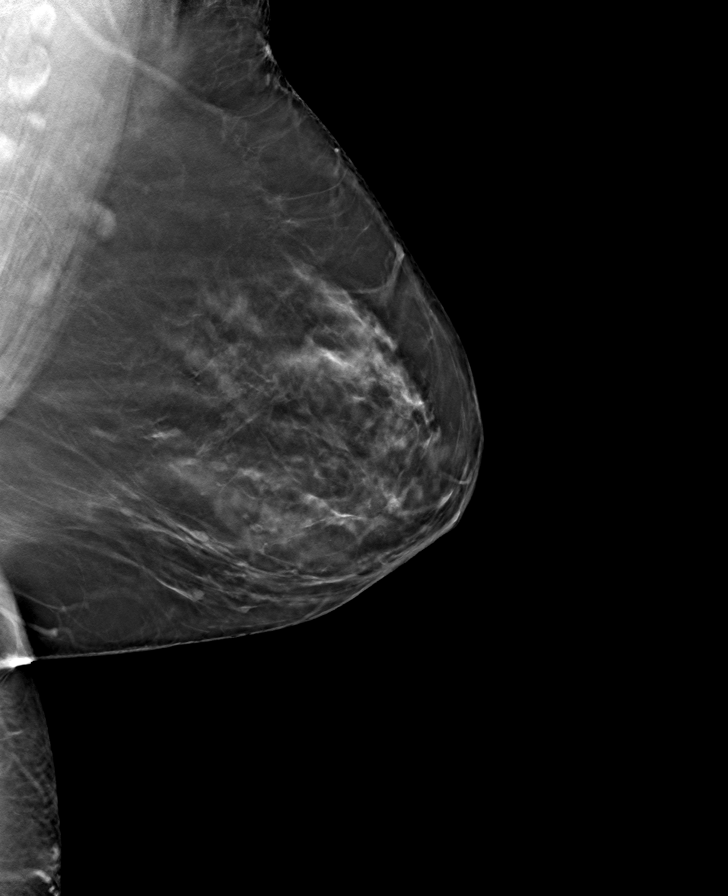

[8 of 24 positions shown; findings below may reference images not displayed]

ACR Breast Density Category b: There are scattered areas of
fibroglandular density.
FINDINGS: There are no findings suspicious for malignancy. Images were
processed with CAD.
IMPRESSION: No mammographic evidence of malignancy. A result letter of this
screening mammogram will be mailed directly to the patient.

RECOMMENDATION:
Screening mammogram in one year. (Code:CN-U-775)

BI-RADS CATEGORY  1: Negative.

## 2021-04-30 ENCOUNTER — Other Ambulatory Visit: Payer: Self-pay

## 2021-04-30 ENCOUNTER — Other Ambulatory Visit (INDEPENDENT_AMBULATORY_CARE_PROVIDER_SITE_OTHER): Payer: BC Managed Care – PPO

## 2021-04-30 DIAGNOSIS — E781 Pure hyperglyceridemia: Secondary | ICD-10-CM

## 2021-04-30 DIAGNOSIS — K760 Fatty (change of) liver, not elsewhere classified: Secondary | ICD-10-CM

## 2021-04-30 DIAGNOSIS — I10 Essential (primary) hypertension: Secondary | ICD-10-CM | POA: Diagnosis not present

## 2021-04-30 DIAGNOSIS — R739 Hyperglycemia, unspecified: Secondary | ICD-10-CM | POA: Diagnosis not present

## 2021-04-30 LAB — HEPATIC FUNCTION PANEL
ALT: 31 U/L (ref 0–35)
AST: 28 U/L (ref 0–37)
Albumin: 4.3 g/dL (ref 3.5–5.2)
Alkaline Phosphatase: 131 U/L — ABNORMAL HIGH (ref 39–117)
Bilirubin, Direct: 0.1 mg/dL (ref 0.0–0.3)
Total Bilirubin: 0.5 mg/dL (ref 0.2–1.2)
Total Protein: 7.5 g/dL (ref 6.0–8.3)

## 2021-04-30 LAB — BASIC METABOLIC PANEL
BUN: 46 mg/dL — ABNORMAL HIGH (ref 6–23)
CO2: 28 mEq/L (ref 19–32)
Calcium: 9.6 mg/dL (ref 8.4–10.5)
Chloride: 102 mEq/L (ref 96–112)
Creatinine, Ser: 2.65 mg/dL — ABNORMAL HIGH (ref 0.40–1.20)
GFR: 18.78 mL/min — ABNORMAL LOW (ref >60.00)
Glucose, Bld: 102 mg/dL — ABNORMAL HIGH (ref 70–99)
Potassium: 3.9 mEq/L (ref 3.5–5.1)
Sodium: 138 mEq/L (ref 135–145)

## 2021-04-30 LAB — LIPID PANEL
Cholesterol: 155 mg/dL (ref 0–200)
HDL: 49.5 mg/dL (ref >39.00)
NonHDL: 105.73
Total CHOL/HDL Ratio: 3
Triglycerides: 281 mg/dL — ABNORMAL HIGH (ref 0.0–149.0)
VLDL: 56.2 mg/dL — ABNORMAL HIGH (ref 0.0–40.0)

## 2021-04-30 LAB — CBC WITH DIFFERENTIAL/PLATELET
Basophils Absolute: 0.1 10*3/uL (ref 0.0–0.1)
Basophils Relative: 0.9 % (ref 0.0–3.0)
Eosinophils Absolute: 0.2 10*3/uL (ref 0.0–0.7)
Eosinophils Relative: 2.6 % (ref 0.0–5.0)
HCT: 38.4 % (ref 36.0–46.0)
Hemoglobin: 12.7 g/dL (ref 12.0–15.0)
Lymphocytes Relative: 33.3 % (ref 12.0–46.0)
Lymphs Abs: 2.5 10*3/uL (ref 0.7–4.0)
MCHC: 33 g/dL (ref 30.0–36.0)
MCV: 95 fl (ref 78.0–100.0)
Monocytes Absolute: 0.9 10*3/uL (ref 0.1–1.0)
Monocytes Relative: 11.6 % (ref 3.0–12.0)
Neutro Abs: 3.9 10*3/uL (ref 1.4–7.7)
Neutrophils Relative %: 51.6 % (ref 43.0–77.0)
Platelets: 189 10*3/uL (ref 150.0–400.0)
RBC: 4.04 Mil/uL (ref 3.87–5.11)
RDW: 13.5 % (ref 11.5–15.5)
WBC: 7.5 10*3/uL (ref 4.0–10.5)

## 2021-04-30 LAB — LDL CHOLESTEROL, DIRECT: Direct LDL: 60 mg/dL

## 2021-04-30 LAB — HEMOGLOBIN A1C: Hgb A1c MFr Bld: 5.8 % (ref 4.6–6.5)

## 2021-04-30 LAB — TSH: TSH: 2.34 u[IU]/mL (ref 0.35–5.50)

## 2021-06-25 ENCOUNTER — Other Ambulatory Visit: Payer: Self-pay | Admitting: Internal Medicine

## 2021-07-26 ENCOUNTER — Ambulatory Visit: Payer: BC Managed Care – PPO | Admitting: Internal Medicine

## 2021-07-26 ENCOUNTER — Encounter: Payer: Self-pay | Admitting: Internal Medicine

## 2021-07-26 VITALS — BP 134/84 | HR 62 | Temp 98.4°F | Resp 14 | Ht 61.0 in | Wt 170.4 lb

## 2021-07-26 DIAGNOSIS — D649 Anemia, unspecified: Secondary | ICD-10-CM | POA: Diagnosis not present

## 2021-07-26 DIAGNOSIS — R7989 Other specified abnormal findings of blood chemistry: Secondary | ICD-10-CM

## 2021-07-26 DIAGNOSIS — L989 Disorder of the skin and subcutaneous tissue, unspecified: Secondary | ICD-10-CM

## 2021-07-26 DIAGNOSIS — E781 Pure hyperglyceridemia: Secondary | ICD-10-CM | POA: Diagnosis not present

## 2021-07-26 DIAGNOSIS — N184 Chronic kidney disease, stage 4 (severe): Secondary | ICD-10-CM

## 2021-07-26 DIAGNOSIS — K760 Fatty (change of) liver, not elsewhere classified: Secondary | ICD-10-CM

## 2021-07-26 DIAGNOSIS — I10 Essential (primary) hypertension: Secondary | ICD-10-CM

## 2021-07-26 DIAGNOSIS — R739 Hyperglycemia, unspecified: Secondary | ICD-10-CM

## 2021-07-26 DIAGNOSIS — E8881 Metabolic syndrome: Secondary | ICD-10-CM

## 2021-07-26 NOTE — Progress Notes (Signed)
Patient ID: Angel Collins, female   DOB: 04/08/1961, 60 y.o.   MRN: [id number] ? ? ?Subjective:  ? ? Patient ID: Angel Collins, female    DOB: 08/23/1960, 60 y.o.   MRN: [id number] ? ?This visit occurred during the SARS-CoV-2 public health emergency.  Safety protocols were in place, including screening questions prior to the visit, additional usage of staff PPE, and extensive cleaning of exam room while observing appropriate contact time as indicated for disinfecting solutions.  ? ?Patient here for a scheduled follow up.  ? ?Chief Complaint  ?Patient presents with  ? Follow-up  ?  4 mo follow up - HTN - pt reports feeling well.  ? .  ? ?HPI ?Reports she is doing relatively well.  Sees Dr Mottl - f/u kidney function.  Given recent GFR, she is being placed back on transplant list.  No chest pain or sob reported.  No abdominal pain or bowel change reported.  Persistent back lesion.  Refer to dermatology.  Walking - walking dog.  ? ? ?Past Medical History:  ?Diagnosis Date  ? Abnormal liver function   ? fatty liver  ? Chronic kidney disease   ? medullary cystic kidney disease  ? Gout   ? Hypercholesterolemia   ? Hyperglycemia   ? Hypertension   ? ?Past Surgical History:  ?Procedure Laterality Date  ? CESAREAN SECTION  1983  ? ovarian cyst removed  1979  ? TUBAL LIGATION  1987  ? ?Family History  ?Problem Relation Age of Onset  ? Lung cancer Mother   ?     died age 48  ? Liver cancer Mother   ? Liver cancer Father   ? Hypertension Brother   ? Hypercholesterolemia Brother   ? Gout Brother   ? Breast cancer Neg Hx   ? ?Social History  ? ?Socioeconomic History  ? Marital status: Married  ?  Spouse name: Not on file  ? Number of children: 3  ? Years of education: Not on file  ? Highest education level: Not on file  ?Occupational History  ? Not on file  ?Tobacco Use  ? Smoking status: Never  ? Smokeless tobacco: Never  ?Substance and Sexual Activity  ? Alcohol use: Yes  ?  Alcohol/week: 0.0 standard drinks  ?  Comment:  occasional  ? Drug use: No  ? Sexual activity: Not on file  ?Other Topics Concern  ? Not on file  ?Social History Narrative  ? Not on file  ? ?Social Determinants of Health  ? ?Financial Resource Strain: Not on file  ?Food Insecurity: Not on file  ?Transportation Needs: Not on file  ?Physical Activity: Not on file  ?Stress: Not on file  ?Social Connections: Not on file  ? ? ? ?Review of Systems  ?Constitutional:  Negative for appetite change and unexpected weight change.  ?HENT:  Negative for congestion and sinus pressure.   ?Respiratory:  Negative for cough, chest tightness and shortness of breath.   ?Cardiovascular:  Negative for chest pain, palpitations and leg swelling.  ?Gastrointestinal:  Negative for abdominal pain, diarrhea, nausea and vomiting.  ?Genitourinary:  Negative for difficulty urinating and dysuria.  ?Musculoskeletal:  Negative for joint swelling and myalgias.  ?Skin:  Negative for color change and rash.  ?Neurological:  Negative for dizziness, light-headedness and headaches.  ?Psychiatric/Behavioral:  Negative for agitation and dysphoric mood.   ? ?   ?Objective:  ?  ? ?BP 134/84   Pulse 62   Temp 98.4 ?  F (36.9 ?C) (Temporal)   Resp 14   Ht 5' 1" (1.549 m)   Wt 170 lb 6.4 oz (77.3 kg)   SpO2 98%   BMI 32.20 kg/m?  ?Wt Readings from Last 3 Encounters:  ?07/26/21 170 lb 6.4 oz (77.3 kg)  ?03/26/21 172 lb 6.4 oz (78.2 kg)  ?12/25/20 169 lb 3.2 oz (76.7 kg)  ? ? ?Physical Exam ?Vitals reviewed.  ?Constitutional:   ?   General: She is not in acute distress. ?   Appearance: Normal appearance.  ?HENT:  ?   Head: Normocephalic and atraumatic.  ?   Right Ear: External ear normal.  ?   Left Ear: External ear normal.  ?Eyes:  ?   General: No scleral icterus.    ?   Right eye: No discharge.     ?   Left eye: No discharge.  ?   Conjunctiva/sclera: Conjunctivae normal.  ?Neck:  ?   Thyroid: No thyromegaly.  ?Cardiovascular:  ?   Rate and Rhythm: Normal rate and regular rhythm.  ?Pulmonary:  ?   Effort:  No respiratory distress.  ?   Breath sounds: Normal breath sounds. No wheezing.  ?Abdominal:  ?   General: Bowel sounds are normal.  ?   Palpations: Abdomen is soft.  ?   Tenderness: There is no abdominal tenderness.  ?Musculoskeletal:     ?   General: No swelling or tenderness.  ?   Cervical back: Neck supple. No tenderness.  ?Lymphadenopathy:  ?   Cervical: No cervical adenopathy.  ?Skin: ?   Findings: No erythema or rash.  ?Neurological:  ?   Mental Status: She is alert.  ?Psychiatric:     ?   Mood and Affect: Mood normal.     ?   Behavior: Behavior normal.  ? ? ? ?Outpatient Encounter Medications as of 07/26/2021  ?Medication Sig  ? aspirin 81 MG tablet Take 81 mg by mouth daily.  ? BD PEN NEEDLE NANO 2ND GEN 32G X 4 MM MISC INJECT 1 APPLICATION INTO THE SKIN AS DIRECTED. WITH VICTOZA  ? famotidine (PEPCID) 20 MG tablet Take 20 mg by mouth 2 (two) times daily.   ? febuxostat (ULORIC) 40 MG tablet TAKE 1 TABLET BY MOUTH EVERY DAY  ? furosemide (LASIX) 40 MG tablet Take 40 mg by mouth daily.  ? metoprolol tartrate (LOPRESSOR) 50 MG tablet TAKE 1 TABLET (50 MG TOTAL) BY MOUTH 2 (TWO) TIMES DAILY.  ? rosuvastatin (CRESTOR) 5 MG tablet TAKE 1 TABLET BY MOUTH EVERY DAY  ? VICTOZA 18 MG/3ML SOPN INJECT 0.6 MG UNDER THE SKIN ONCE DAILY (Patient taking differently: 1.2 mg.)  ? vitamin E 400 UNIT capsule Take 400 Units by mouth 2 (two) times daily.  ? ?No facility-administered encounter medications on file as of 07/26/2021.  ?  ? ?Lab Results  ?Component Value Date  ? WBC 7.5 04/30/2021  ? HGB 12.7 04/30/2021  ? HCT 38.4 04/30/2021  ? PLT 189.0 04/30/2021  ? GLUCOSE 102 (H) 04/30/2021  ? CHOL 155 04/30/2021  ? TRIG 281.0 (H) 04/30/2021  ? HDL 49.50 04/30/2021  ? LDLDIRECT 60.0 04/30/2021  ? LDLCALC 44 01/22/2020  ? ALT 31 04/30/2021  ? AST 28 04/30/2021  ? NA 138 04/30/2021  ? K 3.9 04/30/2021  ? CL 102 04/30/2021  ? CREATININE 2.65 (H) 04/30/2021  ? BUN 46 (H) 04/30/2021  ? CO2 28 04/30/2021  ? TSH 2.34 04/30/2021  ?  HGBA1C 5.8 04/30/2021  ? ? ?  US Abdomen Complete ? ?Result Date: 02/04/2021 ?CLINICAL DATA:  Elevated liver enzymes. EXAM: ABDOMEN ULTRASOUND COMPLETE COMPARISON:  Ultrasound dated 02/11/2015. FINDINGS: Gallbladder: No gallstones or wall thickening visualized. No sonographic Murphy sign noted by sonographer. Common bile duct: Diameter: 4 mm Liver: There is diffuse increased liver echogenicity most commonly seen in the setting of fatty infiltration. Superimposed inflammation or fibrosis is not excluded. Clinical correlation is recommended. Portal vein is patent on color Doppler imaging with normal direction of blood flow towards the liver. IVC: No abnormality visualized. Pancreas: Visualized portion unremarkable. Spleen: Size and appearance within normal limits. Right Kidney: Length: 10.3 cm. There is diffuse increased renal parenchymal echogenicity. No hydronephrosis or shadowing stone. There is a 1 cm upper pole cyst. Left Kidney: Length: 9.1 cm. Moderate parenchyma atrophy. Increased echogenicity. No hydronephrosis or shadowing stone. Abdominal aorta: No aneurysm visualized. Other findings: None. IMPRESSION: 1. Fatty liver. 2. Echogenic kidneys consistent with chronic kidney disease. No hydronephrosis or shadowing stone. Electronically Signed   By: Arash  Radparvar M.D.   On: 02/04/2021 21:46  ? ? ?   ?Assessment & Plan:  ? ?Problem List Items Addressed This Visit   ? ? Abnormal liver function tests  ?  Has been worked up by GI.  S/p liver biopsy.  Discussed diet and exercise.  Weight loss.  Follow liver function tests.  ? ?  ?  ? Relevant Orders  ? Hepatic function panel  ? Anemia  ?  EGD and colonoscopy 01/2019 - results (per overview). Follow cbc.  ? ?  ?  ? Back skin lesion  ?  Refer to dermatology.  ? ?  ?  ? Relevant Orders  ? Ambulatory referral to Dermatology  ? Chronic kidney disease (CKD), stage IV (severe) ([facility])  ?  Sees Dr Mottl - f/u kidney function.  Given recent GFR, she is being placed back on  transplant list.  ?  ?  ? Fatty liver  ?  Diet, exercise and weight loss.  Follow liver function tests.   ? ?  ?  ? Hyperglycemia  ?  Low carb diet and exercise.  Follow met b and a1c.  ? ?  ?  ? Relevant Orders  ? Hemoglobin

## 2021-08-01 ENCOUNTER — Encounter: Payer: Self-pay | Admitting: Internal Medicine

## 2021-08-01 DIAGNOSIS — L989 Disorder of the skin and subcutaneous tissue, unspecified: Secondary | ICD-10-CM | POA: Insufficient documentation

## 2021-08-01 NOTE — Assessment & Plan Note (Signed)
Diet, exercise and weight loss. Follow liver function tests.   

## 2021-08-01 NOTE — Assessment & Plan Note (Signed)
Refer to dermatology 

## 2021-08-01 NOTE — Assessment & Plan Note (Signed)
Low carb diet and exercise.  Follow met b and a1c.  ?

## 2021-08-01 NOTE — Assessment & Plan Note (Signed)
Low carb diet and weight loss.  Follow.  ?

## 2021-08-01 NOTE — Assessment & Plan Note (Signed)
Blood pressure on recheck improved.  Spot check pressure.  Continue metoprolol.  Follow metabolic panel.  Send in readings.  ?

## 2021-08-01 NOTE — Assessment & Plan Note (Signed)
Has been worked up by GI.  S/p liver biopsy.  Discussed diet and exercise.  Weight loss.  Follow liver function tests.  ?

## 2021-08-01 NOTE — Assessment & Plan Note (Signed)
Low carb diet and exercise.  Follow lipid panel and liver function tests.  ?

## 2021-08-01 NOTE — Assessment & Plan Note (Signed)
Sees Dr Alfonse Alpers - f/u kidney function.  Given recent GFR, she is being placed back on transplant list.  ?

## 2021-08-01 NOTE — Assessment & Plan Note (Signed)
EGD and colonoscopy 01/2019 - results (per overview). Follow cbc.  ?

## 2021-08-05 ENCOUNTER — Other Ambulatory Visit: Payer: Self-pay | Admitting: Internal Medicine

## 2021-08-25 ENCOUNTER — Other Ambulatory Visit (INDEPENDENT_AMBULATORY_CARE_PROVIDER_SITE_OTHER): Payer: BC Managed Care – PPO

## 2021-08-25 DIAGNOSIS — R7989 Other specified abnormal findings of blood chemistry: Secondary | ICD-10-CM | POA: Diagnosis not present

## 2021-08-25 DIAGNOSIS — I10 Essential (primary) hypertension: Secondary | ICD-10-CM

## 2021-08-25 DIAGNOSIS — E781 Pure hyperglyceridemia: Secondary | ICD-10-CM | POA: Diagnosis not present

## 2021-08-25 DIAGNOSIS — R739 Hyperglycemia, unspecified: Secondary | ICD-10-CM | POA: Diagnosis not present

## 2021-08-25 LAB — LIPID PANEL
Cholesterol: 171 mg/dL (ref 0–200)
HDL: 47 mg/dL (ref >39.00)
NonHDL: 124.44
Total CHOL/HDL Ratio: 4
Triglycerides: 302 mg/dL — ABNORMAL HIGH (ref 0.0–149.0)
VLDL: 60.4 mg/dL — ABNORMAL HIGH (ref 0.0–40.0)

## 2021-08-25 LAB — HEPATIC FUNCTION PANEL
ALT: 32 U/L (ref 0–35)
AST: 30 U/L (ref 0–37)
Albumin: 4.1 g/dL (ref 3.5–5.2)
Alkaline Phosphatase: 131 U/L — ABNORMAL HIGH (ref 39–117)
Bilirubin, Direct: 0.1 mg/dL (ref 0.0–0.3)
Total Bilirubin: 0.6 mg/dL (ref 0.2–1.2)
Total Protein: 7.3 g/dL (ref 6.0–8.3)

## 2021-08-25 LAB — BASIC METABOLIC PANEL
BUN: 54 mg/dL — ABNORMAL HIGH (ref 6–23)
CO2: 25 mEq/L (ref 19–32)
Calcium: 9.6 mg/dL (ref 8.4–10.5)
Chloride: 103 mEq/L (ref 96–112)
Creatinine, Ser: 2.4 mg/dL — ABNORMAL HIGH (ref 0.40–1.20)
GFR: 21.1 mL/min — ABNORMAL LOW (ref >60.00)
Glucose, Bld: 126 mg/dL — ABNORMAL HIGH (ref 70–99)
Potassium: 4.1 mEq/L (ref 3.5–5.1)
Sodium: 136 mEq/L (ref 135–145)

## 2021-08-25 LAB — HEMOGLOBIN A1C: Hgb A1c MFr Bld: 5.9 % (ref 4.6–6.5)

## 2021-08-25 LAB — LDL CHOLESTEROL, DIRECT: Direct LDL: 67 mg/dL

## 2021-12-20 ENCOUNTER — Other Ambulatory Visit: Payer: Self-pay | Admitting: Internal Medicine

## 2021-12-27 ENCOUNTER — Ambulatory Visit: Payer: BC Managed Care – PPO | Admitting: Internal Medicine

## 2021-12-27 ENCOUNTER — Encounter: Payer: Self-pay | Admitting: Internal Medicine

## 2021-12-27 VITALS — BP 120/78 | HR 66 | Temp 98.2°F | Resp 17 | Ht 61.0 in | Wt 173.2 lb

## 2021-12-27 DIAGNOSIS — I10 Essential (primary) hypertension: Secondary | ICD-10-CM

## 2021-12-27 DIAGNOSIS — Z23 Encounter for immunization: Secondary | ICD-10-CM

## 2021-12-27 DIAGNOSIS — N184 Chronic kidney disease, stage 4 (severe): Secondary | ICD-10-CM

## 2021-12-27 DIAGNOSIS — E781 Pure hyperglyceridemia: Secondary | ICD-10-CM

## 2021-12-27 DIAGNOSIS — N83209 Unspecified ovarian cyst, unspecified side: Secondary | ICD-10-CM

## 2021-12-27 DIAGNOSIS — Z0000 Encounter for general adult medical examination without abnormal findings: Secondary | ICD-10-CM

## 2021-12-27 DIAGNOSIS — E8881 Metabolic syndrome: Secondary | ICD-10-CM

## 2021-12-27 DIAGNOSIS — K760 Fatty (change of) liver, not elsewhere classified: Secondary | ICD-10-CM

## 2021-12-27 DIAGNOSIS — Z1231 Encounter for screening mammogram for malignant neoplasm of breast: Secondary | ICD-10-CM

## 2021-12-27 DIAGNOSIS — R739 Hyperglycemia, unspecified: Secondary | ICD-10-CM

## 2021-12-27 DIAGNOSIS — K219 Gastro-esophageal reflux disease without esophagitis: Secondary | ICD-10-CM

## 2021-12-27 LAB — HEPATIC FUNCTION PANEL
ALT: 35 U/L (ref 0–35)
AST: 32 U/L (ref 0–37)
Albumin: 4.1 g/dL (ref 3.5–5.2)
Alkaline Phosphatase: 153 U/L — ABNORMAL HIGH (ref 39–117)
Bilirubin, Direct: 0.1 mg/dL (ref 0.0–0.3)
Total Bilirubin: 0.6 mg/dL (ref 0.2–1.2)
Total Protein: 7.1 g/dL (ref 6.0–8.3)

## 2021-12-27 LAB — LDL CHOLESTEROL, DIRECT: Direct LDL: 67 mg/dL

## 2021-12-27 LAB — HEMOGLOBIN A1C: Hgb A1c MFr Bld: 6.2 % (ref 4.6–6.5)

## 2021-12-27 LAB — LIPID PANEL
Cholesterol: 156 mg/dL (ref 0–200)
HDL: 45.1 mg/dL (ref >39.00)
NonHDL: 111.11
Total CHOL/HDL Ratio: 3
Triglycerides: 342 mg/dL — ABNORMAL HIGH (ref 0.0–149.0)
VLDL: 68.4 mg/dL — ABNORMAL HIGH (ref 0.0–40.0)

## 2021-12-27 LAB — BASIC METABOLIC PANEL
BUN: 52 mg/dL — ABNORMAL HIGH (ref 6–23)
CO2: 25 mEq/L (ref 19–32)
Calcium: 9.5 mg/dL (ref 8.4–10.5)
Chloride: 104 mEq/L (ref 96–112)
Creatinine, Ser: 2.64 mg/dL — ABNORMAL HIGH (ref 0.40–1.20)
GFR: 18.78 mL/min — ABNORMAL LOW (ref >60.00)
Glucose, Bld: 113 mg/dL — ABNORMAL HIGH (ref 70–99)
Potassium: 4 mEq/L (ref 3.5–5.1)
Sodium: 141 mEq/L (ref 135–145)

## 2021-12-27 NOTE — Progress Notes (Signed)
Patient ID: Angel Collins, female   DOB: 05-28-1960, 60 y.o.   MRN: [id number]   Subjective:    Patient ID: Angel Collins, female    DOB: 1961-03-06, 61 y.o.   MRN: [id number]   Patient here for  Chief Complaint  Patient presents with   Follow-up    4 mth f/u   .   HPI Here to follow up regarding her cholesterol, blood sugar and hypertension. Followed by nephrology for CKD. On transplant list.  Due to follow up with nephrology next month.  States she is doing well.  Increased stress with work.  Overall appears to be handling things well.  No chest pain or sob reported.  No abdominal pain.  Bowels moving.     Past Medical History:  Diagnosis Date   Abnormal liver function    fatty liver   Chronic kidney disease    medullary cystic kidney disease   Gout    Hypercholesterolemia    Hyperglycemia    Hypertension    Past Surgical History:  Procedure Laterality Date   CESAREAN SECTION  1983   ovarian cyst removed  1979   TUBAL LIGATION  1987   Family History  Problem Relation Age of Onset   Lung cancer Mother        died age 36   Liver cancer Mother    Liver cancer Father    Hypertension Brother    Hypercholesterolemia Brother    Gout Brother    Breast cancer Neg Hx    Social History   Socioeconomic History   Marital status: Married    Spouse name: Not on file   Number of children: 3   Years of education: Not on file   Highest education level: Not on file  Occupational History   Not on file  Tobacco Use   Smoking status: Never   Smokeless tobacco: Never  Substance and Sexual Activity   Alcohol use: Yes    Alcohol/week: 0.0 standard drinks of alcohol    Comment: occasional   Drug use: No   Sexual activity: Not on file  Other Topics Concern   Not on file  Social History Narrative   Not on file   Social Determinants of Health   Financial Resource Strain: Medium Risk (05/30/2019)   Overall Financial Resource Strain (CARDIA)    Difficulty of Paying Living  Expenses: Somewhat hard  Food Insecurity: Not on file  Transportation Needs: Not on file  Physical Activity: Not on file  Stress: Not on file  Social Connections: Not on file     Review of Systems  Constitutional:  Negative for appetite change and unexpected weight change.  HENT:  Negative for sinus pressure.   Respiratory:  Negative for cough, chest tightness and shortness of breath.   Cardiovascular:  Negative for chest pain, palpitations and leg swelling.  Gastrointestinal:  Negative for abdominal pain, diarrhea, nausea and vomiting.  Genitourinary:  Negative for difficulty urinating and dysuria.  Musculoskeletal:  Negative for joint swelling and myalgias.  Skin:  Negative for color change and rash.  Neurological:  Negative for dizziness, light-headedness and headaches.  Psychiatric/Behavioral:  Negative for agitation and dysphoric mood.        Objective:     BP 120/78 (BP Location: Left Arm, Patient Position: Sitting, Cuff Size: Normal)   Pulse 66   Temp 98.2 F (36.8 C) (Oral)   Resp 17   Ht '5\' 1"'  (1.549 m)   Wt 173 lb 4  oz (78.6 kg)   SpO2 96%   BMI 32.74 kg/m  Wt Readings from Last 3 Encounters:  12/27/21 173 lb 4 oz (78.6 kg)  07/26/21 170 lb 6.4 oz (77.3 kg)  03/26/21 172 lb 6.4 oz (78.2 kg)    Physical Exam Vitals reviewed.  Constitutional:      General: She is not in acute distress.    Appearance: Normal appearance.  HENT:     Head: Normocephalic and atraumatic.     Right Ear: External ear normal.     Left Ear: External ear normal.  Eyes:     General: No scleral icterus.       Right eye: No discharge.        Left eye: No discharge.     Conjunctiva/sclera: Conjunctivae normal.  Neck:     Thyroid: No thyromegaly.  Cardiovascular:     Rate and Rhythm: Normal rate and regular rhythm.  Pulmonary:     Effort: No respiratory distress.     Breath sounds: Normal breath sounds. No wheezing.  Abdominal:     General: Bowel sounds are normal.      Palpations: Abdomen is soft.     Tenderness: There is no abdominal tenderness.  Musculoskeletal:        General: No swelling or tenderness.     Cervical back: Neck supple. No tenderness.  Lymphadenopathy:     Cervical: No cervical adenopathy.  Skin:    Findings: No erythema or rash.  Neurological:     Mental Status: She is alert.  Psychiatric:        Mood and Affect: Mood normal.        Behavior: Behavior normal.      Outpatient Encounter Medications as of 12/27/2021  Medication Sig   aspirin 81 MG tablet Take 81 mg by mouth daily.   BD PEN NEEDLE NANO 2ND GEN 32G X 4 MM MISC INJECT 1 APPLICATION INTO THE SKIN AS DIRECTED. WITH VICTOZA   famotidine (PEPCID) 20 MG tablet Take 20 mg by mouth 2 (two) times daily.    febuxostat (ULORIC) 40 MG tablet TAKE 1 TABLET BY MOUTH EVERY DAY   furosemide (LASIX) 40 MG tablet Take 40 mg by mouth daily.   metoprolol tartrate (LOPRESSOR) 50 MG tablet TAKE 1 TABLET (50 MG TOTAL) BY MOUTH 2 (TWO) TIMES DAILY.   rosuvastatin (CRESTOR) 5 MG tablet TAKE 1 TABLET BY MOUTH EVERY DAY   VICTOZA 18 MG/3ML SOPN INJECT 0.6 MG UNDER THE SKIN ONCE DAILY (Patient taking differently: 1.2 mg.)   vitamin E 400 UNIT capsule Take 400 Units by mouth 2 (two) times daily.   No facility-administered encounter medications on file as of 12/27/2021.     Lab Results  Component Value Date   WBC 7.5 04/30/2021   HGB 12.7 04/30/2021   HCT 38.4 04/30/2021   PLT 189.0 04/30/2021   GLUCOSE 113 (H) 12/27/2021   CHOL 156 12/27/2021   TRIG 342.0 (H) 12/27/2021   HDL 45.10 12/27/2021   LDLDIRECT 67.0 12/27/2021   LDLCALC 44 01/22/2020   ALT 35 12/27/2021   AST 32 12/27/2021   NA 141 12/27/2021   K 4.0 12/27/2021   CL 104 12/27/2021   CREATININE 2.64 (H) 12/27/2021   BUN 52 (H) 12/27/2021   CO2 25 12/27/2021   TSH 2.34 04/30/2021   HGBA1C 6.2 12/27/2021    US Abdomen Complete  Result Date: 02/04/2021 CLINICAL DATA:  Elevated liver enzymes. EXAM: ABDOMEN ULTRASOUND  COMPLETE COMPARISON:  Ultrasound  dated 02/11/2015. FINDINGS: Gallbladder: No gallstones or wall thickening visualized. No sonographic Murphy sign noted by sonographer. Common bile duct: Diameter: 4 mm Liver: There is diffuse increased liver echogenicity most commonly seen in the setting of fatty infiltration. Superimposed inflammation or fibrosis is not excluded. Clinical correlation is recommended. Portal vein is patent on color Doppler imaging with normal direction of blood flow towards the liver. IVC: No abnormality visualized. Pancreas: Visualized portion unremarkable. Spleen: Size and appearance within normal limits. Right Kidney: Length: 10.3 cm. There is diffuse increased renal parenchymal echogenicity. No hydronephrosis or shadowing stone. There is a 1 cm upper pole cyst. Left Kidney: Length: 9.1 cm. Moderate parenchyma atrophy. Increased echogenicity. No hydronephrosis or shadowing stone. Abdominal aorta: No aneurysm visualized. Other findings: None. IMPRESSION: 1. Fatty liver. 2. Echogenic kidneys consistent with chronic kidney disease. No hydronephrosis or shadowing stone. Electronically Signed   By: Anner Crete M.D.   On: 02/04/2021 21:46       Assessment & Plan:   Problem List Items Addressed This Visit     Chronic kidney disease (CKD), stage IV (severe) (HCC)    Sees Dr Alfonse Alpers - f/u kidney function.  Given recent GFR, she is being placed back on transplant list. Check met b today and fax to nephrology.       Fatty liver    Diet, exercise and weight loss.  Follow liver function tests.        Relevant Orders   Hepatic function panel (Completed)   GERD (gastroesophageal reflux disease)    Acid reflux controlled.  Omeprazole.       Hyperglycemia    Low carb diet and exercise.  Follow met b and a1c.       Relevant Orders   Hemoglobin A1c (Completed)   Hypertension - Primary    Blood pressure as outlined.   Spot check pressure.  Continue metoprolol.  Follow metabolic panel.   Send in readings.       Relevant Orders   Basic Metabolic Panel (BMET) (Completed)   Hypertriglyceridemia    Low carb diet and exercise.  Follow lipid panel and liver function tests.       Relevant Orders   Lipid panel (Completed)   Metabolic syndrome    Low carb diet and weight loss.  Follow.       Ovarian cyst    Reviewed note 09/2020 - per note, f/u ultrasound revealed no ovarian cyst.  S/p endometrial biopsy.  Per pt - ok.  F/u with gyn to confirm no further w/up warranted. Called gyn.  Received call back 04/14/21 Jan[facility] Billow - Dr Leafy Ro) - no post menopausal bleeding, ultrasound reassuring, no f/u needed unless bleeds again.        Other Visit Diagnoses     Routine general medical examination at health care facility       Need for immunization against influenza       Relevant Orders   Flu Vaccine QUAD 54moIM (Fluarix, Fluzone & Alfiuria Quad PF) (Completed)   Encounter for screening mammogram for malignant neoplasm of breast       Relevant Orders   MM 3D SCREEN BREAST BILATERAL        CEinar Pheasant MD

## 2022-01-02 ENCOUNTER — Encounter: Payer: Self-pay | Admitting: Internal Medicine

## 2022-01-02 NOTE — Assessment & Plan Note (Signed)
Acid reflux controlled.  Omeprazole.

## 2022-01-02 NOTE — Assessment & Plan Note (Signed)
Sees Dr Alfonse Alpers - f/u kidney function.  Given recent GFR, she is being placed back on transplant list. Check met b today and fax to nephrology.

## 2022-01-02 NOTE — Assessment & Plan Note (Signed)
Diet, exercise and weight loss. Follow liver function tests.   

## 2022-01-02 NOTE — Assessment & Plan Note (Addendum)
Low carb diet and exercise.  Follow lipid panel and liver function tests.

## 2022-01-02 NOTE — Assessment & Plan Note (Signed)
Blood pressure as outlined.   Spot check pressure.  Continue metoprolol.  Follow metabolic panel.  Send in readings.

## 2022-01-02 NOTE — Assessment & Plan Note (Addendum)
Reviewed note 09/2020 - per note, f/u ultrasound revealed no ovarian cyst.  S/p endometrial biopsy.  Per pt - ok.  F/u with gyn to confirm no further w/up warranted. Called gyn.  Received call back 04/14/21 Janett Billow - Dr Leafy Ro) - no post menopausal bleeding, ultrasound reassuring, no f/u needed unless bleeds again.

## 2022-01-02 NOTE — Assessment & Plan Note (Signed)
Low carb diet and exercise.  Follow met b and a1c.  

## 2022-01-02 NOTE — Assessment & Plan Note (Signed)
Low carb diet and weight loss.  Follow.

## 2022-01-03 ENCOUNTER — Other Ambulatory Visit: Payer: Self-pay | Admitting: Internal Medicine

## 2022-01-26 ENCOUNTER — Telehealth: Payer: Self-pay | Admitting: Internal Medicine

## 2022-01-26 NOTE — Telephone Encounter (Signed)
Pt has been informed.

## 2022-01-26 NOTE — Telephone Encounter (Signed)
Notify Angel Collins that her mammogram has been scheduled for 03/02/22 at 11:20.  If any problems can call and change.

## 2022-02-02 ENCOUNTER — Ambulatory Visit: Payer: BC Managed Care – PPO | Admitting: Dermatology

## 2022-02-02 DIAGNOSIS — L820 Inflamed seborrheic keratosis: Secondary | ICD-10-CM

## 2022-02-02 DIAGNOSIS — L578 Other skin changes due to chronic exposure to nonionizing radiation: Secondary | ICD-10-CM | POA: Diagnosis not present

## 2022-02-02 DIAGNOSIS — L821 Other seborrheic keratosis: Secondary | ICD-10-CM

## 2022-02-02 NOTE — Patient Instructions (Addendum)
Cryotherapy Aftercare  Wash gently with soap and water everyday.   Apply Vaseline and Band-Aid daily until healed.     Due to recent changes in healthcare laws, you may see results of your pathology and/or laboratory studies on MyChart before the doctors have had a chance to review them. We understand that in some cases there may be results that are confusing or concerning to you. Please understand that not all results are received at the same time and often the doctors may need to interpret multiple results in order to provide you with the best plan of care or course of treatment. Therefore, we ask that you please give us 2 business days to thoroughly review all your results before contacting the office for clarification. Should we see a critical lab result, you will be contacted sooner.   If You Need Anything After Your Visit  If you have any questions or concerns for your doctor, please call our main line at [phone] and press option 4 to reach your doctor's medical assistant. If no one answers, please leave a voicemail as directed and we will return your call as soon as possible. Messages left after 4 pm will be answered the following business day.   You may also send us a message via MyChart. We typically respond to MyChart messages within 1-2 business days.  For prescription refills, please ask your pharmacy to contact our office. Our fax number is [phone].  If you have an urgent issue when the clinic is closed that cannot wait until the next business day, you can page your doctor at the number below.    Please note that while we do our best to be available for urgent issues outside of office hours, we are not available 24/7.   If you have an urgent issue and are unable to reach us, you may choose to seek medical care at your doctor's office, retail clinic, urgent care center, or emergency room.  If you have a medical emergency, please immediately call 911 or go to the  emergency department.  Pager Numbers  - Dr. Kowalski: [phone]  - Dr. Moye: [phone]  - Dr. Stewart: [phone]  In the event of inclement weather, please call our main line at [phone] for an update on the status of any delays or closures.  Dermatology Medication Tips: Please keep the boxes that topical medications come in in order to help keep track of the instructions about where and how to use these. Pharmacies typically print the medication instructions only on the boxes and not directly on the medication tubes.   If your medication is too expensive, please contact our office at [phone] option 4 or send us a message through MyChart.   We are unable to tell what your co-pay for medications will be in advance as this is different depending on your insurance coverage. However, we may be able to find a substitute medication at lower cost or fill out paperwork to get insurance to cover a needed medication.   If a prior authorization is required to get your medication covered by your insurance company, please allow us 1-2 business days to complete this process.  Drug prices often vary depending on where the prescription is filled and some pharmacies may offer cheaper prices.  The website www.goodrx.com contains coupons for medications through different pharmacies. The prices here do not account for what the cost may be with help from insurance (it may be cheaper with your insurance), but the website can   give you the price if you did not use any insurance.  - You can print the associated coupon and take it with your prescription to the pharmacy.  - You may also stop by our office during regular business hours and pick up a GoodRx coupon card.  - If you need your prescription sent electronically to a different pharmacy, notify our office through [facility] MyChart or by phone at [phone] option 4.     Si Usted Necesita Algo Despus de Su Visita  Tambin puede  enviarnos un mensaje a travs de MyChart. Por lo general respondemos a los mensajes de MyChart en el transcurso de 1 a 2 das hbiles.  Para renovar recetas, por favor pida a su farmacia que se ponga en contacto con nuestra oficina. Nuestro nmero de fax es el [phone].  Si tiene un asunto urgente cuando la clnica est cerrada y que no puede esperar hasta el siguiente da hbil, puede llamar/localizar a su doctor(a) al nmero que aparece a continuacin.   Por favor, tenga en cuenta que aunque hacemos todo lo posible para estar disponibles para asuntos urgentes fuera del horario de oficina, no estamos disponibles las 24 horas del da, los 7 das de la semana.   Si tiene un problema urgente y no puede comunicarse con nosotros, puede optar por buscar atencin mdica  en el consultorio de su doctor(a), en una clnica privada, en un centro de atencin urgente o en una sala de emergencias.  Si tiene una emergencia mdica, por favor llame inmediatamente al 911 o vaya a la sala de emergencias.  Nmeros de bper  - Dr. Kowalski: [phone]  - Dra. Moye: [phone]  - Dra. Stewart: [phone]  En caso de inclemencias del tiempo, por favor llame a nuestra lnea principal al [phone] para una actualizacin sobre el estado de cualquier retraso o cierre.  Consejos para la medicacin en dermatologa: Por favor, guarde las cajas en las que vienen los medicamentos de uso tpico para ayudarle a seguir las instrucciones sobre dnde y cmo usarlos. Las farmacias generalmente imprimen las instrucciones del medicamento slo en las cajas y no directamente en los tubos del medicamento.   Si su medicamento es muy caro, por favor, pngase en contacto con nuestra oficina llamando al [phone] y presione la opcin 4 o envenos un mensaje a travs de MyChart.   No podemos decirle cul ser su copago por los medicamentos por adelantado ya que esto es diferente dependiendo de la cobertura de su seguro.  Sin embargo, es posible que podamos encontrar un medicamento sustituto a menor costo o llenar un formulario para que el seguro cubra el medicamento que se considera necesario.   Si se requiere una autorizacin previa para que su compaa de seguros cubra su medicamento, por favor permtanos de 1 a 2 das hbiles para completar este proceso.  Los precios de los medicamentos varan con frecuencia dependiendo del lugar de dnde se surte la receta y alguna farmacias pueden ofrecer precios ms baratos.  El sitio web www.goodrx.com tiene cupones para medicamentos de diferentes farmacias. Los precios aqu no tienen en cuenta lo que podra costar con la ayuda del seguro (puede ser ms barato con su seguro), pero el sitio web puede darle el precio si no utiliz ningn seguro.  - Puede imprimir el cupn correspondiente y llevarlo con su receta a la farmacia.  - Tambin puede pasar por nuestra oficina durante el horario de atencin regular y recoger una tarjeta de cupones de GoodRx.  -   Si necesita que su receta se enve electrnicamente a una farmacia diferente, informe a nuestra oficina a travs de MyChart de [place] o por telfono llamando al [phone] y presione la opcin 4.  

## 2022-02-02 NOTE — Progress Notes (Signed)
   New Patient Visit  Subjective  Angel Collins is a 61 y.o. female who presents for the following: Skin Problem (The patient has spots, moles and lesions to be evaluated, some may be new or changing and the patient has concerns that these could be cancer. ).  The following portions of the chart were reviewed this encounter and updated as appropriate:   Tobacco  Allergies  Meds  Problems  Med Hx  Surg Hx  Fam Hx     Review of Systems:  No other skin or systemic complaints except as noted in HPI or Assessment and Plan.  Objective  Well appearing patient in no apparent distress; mood and affect are within normal limits.  A focused examination was performed including back,face. Relevant physical exam findings are noted in the Assessment and Plan.  back x 6 (6) Stuck-on, waxy, tan-brown papules  -- Discussed benign etiology and prognosis.    Assessment & Plan  Inflamed seborrheic keratosis (6) back x 6  Symptomatic, irritating, patient would like treated.   Destruction of lesion - back x 6 Complexity: simple   Destruction method: cryotherapy   Informed consent: discussed and consent obtained   Timeout:  patient name, date of birth, surgical site, and procedure verified Lesion destroyed using liquid nitrogen: Yes   Region frozen until ice ball extended beyond lesion: Yes   Outcome: patient tolerated procedure well with no complications   Post-procedure details: wound care instructions given    Seborrheic Keratoses - Stuck-on, waxy, tan-brown papules and/or plaques  - Benign-appearing - Discussed benign etiology and prognosis. - Observe - Call for any changes   Actinic Damage - chronic, secondary to cumulative UV radiation exposure/sun exposure over time - diffuse scaly erythematous macules with underlying dyspigmentation - Recommend daily broad spectrum sunscreen SPF 30+ to sun-exposed areas, reapply every 2 hours as needed.  - Recommend staying in the shade or  wearing long sleeves, sun glasses (UVA+UVB protection) and wide brim hats (4-inch brim around the entire circumference of the hat). - Call for new or changing lesions.  Return if symptoms worsen or fail to improve.  IMarye Round, CMA, am acting as scribe for Sarina Ser, MD . Documentation: I have reviewed the above documentation for accuracy and completeness, and I agree with the above.  Sarina Ser, MD

## 2022-02-07 ENCOUNTER — Encounter: Payer: Self-pay | Admitting: Dermatology

## 2022-03-02 ENCOUNTER — Ambulatory Visit
Admission: RE | Admit: 2022-03-02 | Discharge: 2022-03-02 | Disposition: A | Discharge status: Home / Self Care | Payer: BC Managed Care – PPO | Source: Ambulatory Visit | Attending: Internal Medicine | Admitting: Internal Medicine

## 2022-03-02 DIAGNOSIS — Z1231 Encounter for screening mammogram for malignant neoplasm of breast: Secondary | ICD-10-CM | POA: Diagnosis present

## 2022-03-03 ENCOUNTER — Other Ambulatory Visit: Payer: Self-pay | Admitting: Internal Medicine

## 2022-03-28 ENCOUNTER — Encounter: Payer: Self-pay | Admitting: Internal Medicine

## 2022-03-29 ENCOUNTER — Telehealth (INDEPENDENT_AMBULATORY_CARE_PROVIDER_SITE_OTHER): Payer: BC Managed Care – PPO | Admitting: Family Medicine

## 2022-03-29 VITALS — Ht 60.0 in | Wt 173.0 lb

## 2022-03-29 DIAGNOSIS — R051 Acute cough: Secondary | ICD-10-CM

## 2022-03-29 DIAGNOSIS — R0981 Nasal congestion: Secondary | ICD-10-CM | POA: Diagnosis not present

## 2022-03-29 MED ORDER — BENZONATATE 100 MG PO CAPS: ORAL_CAPSULE | ORAL | 0 refills | Status: DC

## 2022-03-29 NOTE — Progress Notes (Signed)
Virtual Visit via Video Note  I connected with Angel Collins  on 03/29/22 at  5:00 PM EST by a video enabled telemedicine application and verified that I am speaking with the correct person using two identifiers.  Location patient: [place] Location provider:work or home office Persons participating in the virtual visit: patient, provider  I discussed the limitations and requested verbal permission for telemedicine visit. The patient expressed understanding and agreed to proceed.   HPI:  Acute telemedicine visit for cough and sinus congestion: -Onset: about 4 days ago -one covid test at home negative -Symptoms include:started with sore throat, then nasal congestion and cough, had a fever for a few days, has bad coughing fits -fever now resolved, still has cough -works around a lot of people -Denies: CP, SOB, NVD, body aches -Has tried: -Pertinent past medical history: see below -Pertinent medication allergies:  Allergies  Allergen Reactions   Norvasc [Amlodipine Besylate]     Intolerance    Sulfa Antibiotics    -COVID-19 vaccine status: Immunization History  Administered Date(s) Administered   Hepatitis B, Dialysis 03/27/2015, 04/28/2015, 09/30/2015   Influenza, Seasonal, Injecte, Preservative Fre 03/18/2011, 03/16/2012   Influenza,inj,Quad PF,6+ Mos 01/03/2013, 12/24/2013, 01/30/2015, 01/08/2016, 12/13/2016, 12/18/2017, 12/10/2018, 01/01/2020, 12/25/2020, 12/27/2021   PFIZER(Purple Top)SARS-COV-2 Vaccination 06/24/2019, 07/15/2019, 03/10/2020   Pneumococcal Conjugate-13 06/09/2017   Pneumococcal Polysaccharide-23 02/01/2018     ROS: See pertinent positives and negatives per HPI.  Past Medical History:  Diagnosis Date   Abnormal liver function    fatty liver   Chronic kidney disease    medullary cystic kidney disease   Gout    Hypercholesterolemia    Hyperglycemia    Hypertension     Past Surgical History:  Procedure Laterality Date   CESAREAN SECTION  1983   ovarian cyst  removed  1979   TUBAL LIGATION  1987     Current Outpatient Medications:    aspirin 81 MG tablet, Take 81 mg by mouth daily., Disp: , Rfl:    benzonatate (TESSALON PERLES) 100 MG capsule, 1-2 capsules up to twice daily as needed for cough., Disp: 30 capsule, Rfl: 0   famotidine (PEPCID) 20 MG tablet, Take 20 mg by mouth 2 (two) times daily. , Disp: , Rfl:    febuxostat (ULORIC) 40 MG tablet, TAKE 1 TABLET BY MOUTH EVERY DAY, Disp: 90 tablet, Rfl: 1   furosemide (LASIX) 40 MG tablet, Take 40 mg by mouth daily., Disp: , Rfl:    metoprolol tartrate (LOPRESSOR) 50 MG tablet, TAKE 1 TABLET BY MOUTH TWICE A DAY, Disp: 180 tablet, Rfl: 3   OZEMPIC, 0.25 OR 0.5 MG/DOSE, 2 MG/3ML SOPN, Inject into the skin., Disp: , Rfl:    rosuvastatin (CRESTOR) 5 MG tablet, TAKE 1 TABLET BY MOUTH EVERY DAY, Disp: 90 tablet, Rfl: 1   vitamin E 400 UNIT capsule, Take 400 Units by mouth 2 (two) times daily., Disp: , Rfl:   EXAM:  VITALS per patient if applicable:  GENERAL: alert, oriented, appears well and in no acute distress  HEENT: atraumatic, conjunttiva clear, no obvious abnormalities on inspection of external nose and ears  NECK: normal movements of the head and neck  LUNGS: on inspection no signs of respiratory distress, breathing rate appears normal, no obvious gross SOB, gasping or wheezing  CV: no obvious cyanosis  MS: moves all visible extremities without noticeable abnormality  PSYCH/NEURO: pleasant and cooperative, no obvious depression or anxiety, speech and thought processing grossly intact  ASSESSMENT AND PLAN:  Discussed the following assessment  and plan:  Nasal congestion  Acute cough  -we discussed possible serious and likely etiologies, options for evaluation and workup, limitations of telemedicine visit vs in person visit, treatment, treatment risks and precautions. Pt is agreeable to treatment via telemedicine at this moment. Query influenza, covid with false neg testing vs  other. She has opted to try tessalon for cough. Rx sent. Other symptomatic care measures summarized below in pt instructions.  Work/School slipped offered: declined Advised to seek prompt virtual visit or in person care if worsening, new symptoms arise, or if is not improving with treatment as expected per our conversation of expected course. Discussed options for follow up care. 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 Advised to schedule follow up visit with PCP, [place] virtual visits or [facility] if any further questions or concerns to avoid delays in care.   I discussed the assessment and treatment plan with the patient. The patient was provided an opportunity to ask questions and all were answered. The patient agreed with the plan and demonstrated an understanding of the instructions.     Angel Kern, DO

## 2022-03-29 NOTE — Patient Instructions (Signed)
-  I sent the medication(s) we discussed to your pharmacy: Meds ordered this encounter  Medications   benzonatate (TESSALON PERLES) 100 MG capsule    Sig: 1-2 capsules up to twice daily as needed for cough.    Dispense:  30 capsule    Refill:  0    Can do nasal saline twice daily if needed, warm salt water gargles twice daily for 3 days  Tylenol if needed per instructions.   I hope you are feeling better soon!  Seek in person care promptly if your symptoms worsen, new concerns arise or you are not improving with treatment.  It was nice to meet you today. I help [facility] out with telemedicine visits on Tuesdays and Thursdays and am happy to help if you need a virtual follow up visit on those days. Otherwise, if you have any concerns or questions following this visit please schedule a follow up visit with your Primary Care office or seek care at a local urgent care clinic to avoid delays in care. If you are having severe or life threatening symptoms please call 911 and/or go to the nearest emergency room.

## 2022-04-28 ENCOUNTER — Other Ambulatory Visit (HOSPITAL_COMMUNITY)
Admission: RE | Admit: 2022-04-28 | Discharge: 2022-04-28 | Disposition: A | Discharge status: Home / Self Care | Payer: BC Managed Care – PPO | Source: Ambulatory Visit | Attending: Internal Medicine | Admitting: Internal Medicine

## 2022-04-28 ENCOUNTER — Encounter: Payer: Self-pay | Admitting: Internal Medicine

## 2022-04-28 ENCOUNTER — Ambulatory Visit (INDEPENDENT_AMBULATORY_CARE_PROVIDER_SITE_OTHER): Payer: BC Managed Care – PPO | Admitting: Internal Medicine

## 2022-04-28 VITALS — BP 122/70 | HR 73 | Temp 98.0°F | Resp 16 | Ht 60.0 in | Wt 163.8 lb

## 2022-04-28 DIAGNOSIS — R7989 Other specified abnormal findings of blood chemistry: Secondary | ICD-10-CM

## 2022-04-28 DIAGNOSIS — D649 Anemia, unspecified: Secondary | ICD-10-CM

## 2022-04-28 DIAGNOSIS — E8881 Metabolic syndrome: Secondary | ICD-10-CM

## 2022-04-28 DIAGNOSIS — N184 Chronic kidney disease, stage 4 (severe): Secondary | ICD-10-CM

## 2022-04-28 DIAGNOSIS — R739 Hyperglycemia, unspecified: Secondary | ICD-10-CM

## 2022-04-28 DIAGNOSIS — K219 Gastro-esophageal reflux disease without esophagitis: Secondary | ICD-10-CM

## 2022-04-28 DIAGNOSIS — I10 Essential (primary) hypertension: Secondary | ICD-10-CM | POA: Diagnosis not present

## 2022-04-28 DIAGNOSIS — Z0000 Encounter for general adult medical examination without abnormal findings: Secondary | ICD-10-CM

## 2022-04-28 DIAGNOSIS — Z124 Encounter for screening for malignant neoplasm of cervix: Secondary | ICD-10-CM

## 2022-04-28 DIAGNOSIS — E781 Pure hyperglyceridemia: Secondary | ICD-10-CM

## 2022-04-28 NOTE — Progress Notes (Signed)
Subjective:    Patient ID: Angel Collins, female    DOB: 04/12/1960, 62 y.o.   MRN: [id number]  Patient here for  Chief Complaint  Patient presents with   Annual Exam    HPI Here for physical exam. Reports she is doing relatively well.  On ozempic now instead of victoza.  Tolerating.  States may cause occasional nausea, but overall tolerating well.  No chest pain or sob reported.  No increased cough or congestion.  No abdominal pain or bowel change.  Followed by Dr Alfonse Alpers - f/u CKD.     Past Medical History:  Diagnosis Date   Abnormal liver function    fatty liver   Chronic kidney disease    medullary cystic kidney disease   Gout    Hypercholesterolemia    Hyperglycemia    Hypertension    Past Surgical History:  Procedure Laterality Date   CESAREAN SECTION  1983   ovarian cyst removed  1979   TUBAL LIGATION  1987   Family History  Problem Relation Age of Onset   Lung cancer Mother        died age 40   Liver cancer Mother    Liver cancer Father    Hypertension Brother    Hypercholesterolemia Brother    Gout Brother    Breast cancer Neg Hx    Social History   Socioeconomic History   Marital status: Married    Spouse name: Not on file   Number of children: 3   Years of education: Not on file   Highest education level: Not on file  Occupational History   Not on file  Tobacco Use   Smoking status: Never   Smokeless tobacco: Never  Substance and Sexual Activity   Alcohol use: Yes    Alcohol/week: 0.0 standard drinks of alcohol    Comment: occasional   Drug use: No   Sexual activity: Not on file  Other Topics Concern   Not on file  Social History Narrative   Not on file   Social Determinants of Health   Financial Resource Strain: Medium Risk (05/30/2019)   Overall Financial Resource Strain (CARDIA)    Difficulty of Paying Living Expenses: Somewhat hard  Food Insecurity: Not on file  Transportation Needs: Not on file  Physical Activity: Not on file   Stress: Not on file  Social Connections: Not on file     Review of Systems  Constitutional:  Negative for appetite change and unexpected weight change.  HENT:  Negative for congestion, sinus pressure and sore throat.   Eyes:  Negative for pain and visual disturbance.  Respiratory:  Negative for cough, chest tightness and shortness of breath.   Cardiovascular:  Negative for chest pain, palpitations and leg swelling.  Gastrointestinal:  Negative for abdominal pain, diarrhea, nausea and vomiting.  Genitourinary:  Negative for difficulty urinating and dysuria.  Musculoskeletal:  Negative for joint swelling and myalgias.  Skin:  Negative for color change and rash.  Neurological:  Negative for dizziness and headaches.  Hematological:  Negative for adenopathy. Does not bruise/bleed easily.  Psychiatric/Behavioral:  Negative for agitation and dysphoric mood.        Objective:     BP 122/70   Pulse 73   Temp 98 F (36.7 C)   Resp 16   Ht 5' (1.524 m)   Wt 163 lb 12.8 oz (74.3 kg)   SpO2 97%   BMI 31.99 kg/m  Wt Readings from Last 3 Encounters:  04/28/22 163 lb 12.8 oz (74.3 kg)  03/29/22 173 lb (78.5 kg)  12/27/21 173 lb 4 oz (78.6 kg)    Physical Exam Vitals reviewed.  Constitutional:      General: She is not in acute distress.    Appearance: Normal appearance. She is well-developed.  HENT:     Head: Normocephalic and atraumatic.     Right Ear: External ear normal.     Left Ear: External ear normal.  Eyes:     General: No scleral icterus.       Right eye: No discharge.        Left eye: No discharge.     Conjunctiva/sclera: Conjunctivae normal.  Neck:     Thyroid: No thyromegaly.  Cardiovascular:     Rate and Rhythm: Normal rate and regular rhythm.  Pulmonary:     Effort: No tachypnea, accessory muscle usage or respiratory distress.     Breath sounds: Normal breath sounds. No decreased breath sounds or wheezing.  Chest:  Breasts:    Right: No inverted nipple,  mass, nipple discharge or tenderness (no axillary adenopathy).     Left: No inverted nipple, mass, nipple discharge or tenderness (no axilarry adenopathy).  Abdominal:     General: Bowel sounds are normal.     Palpations: Abdomen is soft.     Tenderness: There is no abdominal tenderness.  Genitourinary:    Comments: Normal external genitalia.  Vaginal vault without lesions.  Pap smear performed.  Could not appreciate any adnexal masses or tenderness.   Musculoskeletal:        General: No swelling or tenderness.     Cervical back: Neck supple.  Lymphadenopathy:     Cervical: No cervical adenopathy.  Skin:    Findings: No erythema or rash.  Neurological:     Mental Status: She is alert and oriented to person, place, and time.  Psychiatric:        Mood and Affect: Mood normal.        Behavior: Behavior normal.      Outpatient Encounter Medications as of 04/28/2022  Medication Sig   aspirin 81 MG tablet Take 81 mg by mouth daily.   famotidine (PEPCID) 20 MG tablet Take 20 mg by mouth 2 (two) times daily.    febuxostat (ULORIC) 40 MG tablet TAKE 1 TABLET BY MOUTH EVERY DAY   furosemide (LASIX) 40 MG tablet Take 40 mg by mouth daily.   metoprolol tartrate (LOPRESSOR) 50 MG tablet TAKE 1 TABLET BY MOUTH TWICE A DAY   OZEMPIC, 0.25 OR 0.5 MG/DOSE, 2 MG/3ML SOPN Inject 1 mg into the skin once a week.   rosuvastatin (CRESTOR) 5 MG tablet TAKE 1 TABLET BY MOUTH EVERY DAY   vitamin E 400 UNIT capsule Take 400 Units by mouth 2 (two) times daily.   [DISCONTINUED] benzonatate (TESSALON PERLES) 100 MG capsule 1-2 capsules up to twice daily as needed for cough.   No facility-administered encounter medications on file as of 04/28/2022.     Lab Results  Component Value Date   WBC 7.5 04/30/2021   HGB 12.7 04/30/2021   HCT 38.4 04/30/2021   PLT 189.0 04/30/2021   GLUCOSE 113 (H) 12/27/2021   CHOL 156 12/27/2021   TRIG 342.0 (H) 12/27/2021   HDL 45.10 12/27/2021   LDLDIRECT 67.0 12/27/2021    LDLCALC 44 01/22/2020   ALT 35 12/27/2021   AST 32 12/27/2021   NA 141 12/27/2021   K 4.0 12/27/2021   CL 104 12/27/2021  CREATININE 2.64 (H) 12/27/2021   BUN 52 (H) 12/27/2021   CO2 25 12/27/2021   TSH 2.34 04/30/2021   HGBA1C 6.2 12/27/2021    MM 3D SCREEN BREAST BILATERAL  Result Date: 03/07/2022 CLINICAL DATA:  Screening. EXAM: DIGITAL SCREENING BILATERAL MAMMOGRAM WITH TOMOSYNTHESIS AND CAD TECHNIQUE: Bilateral screening digital craniocaudal and mediolateral oblique mammograms were obtained. Bilateral screening digital breast tomosynthesis was performed. The images were evaluated with computer-aided detection. COMPARISON:  Previous exam(s). ACR Breast Density Category b: There are scattered areas of fibroglandular density. FINDINGS: There are no findings suspicious for malignancy. IMPRESSION: No mammographic evidence of malignancy. A result letter of this screening mammogram will be mailed directly to the patient. RECOMMENDATION: Screening mammogram in one year. (Code:SM-B-01Y) BI-RADS CATEGORY  1: Negative. Electronically Signed   By: Ammie Ferrier M.D.   On: 03/07/2022 11:24       Assessment & Plan:  Routine general medical examination at a health care facility  Cervical cancer screening -     Cytology - PAP  Primary hypertension Assessment & Plan: Blood pressure as outlined.   Spot check pressure.  Continue metoprolol.  Follow metabolic panel.  Send in readings.   Orders: -     CBC with Differential/Platelet; Future -     Basic metabolic panel; Future -     TSH; Future  Hyperglycemia Assessment & Plan: Nephrology - changed to ozempic - improved CV, NAFLD and weight loss endpoints. Low carb diet and exercise.  Follow met b and A1c,   Orders: -     Hemoglobin A1c; Future  Hypertriglyceridemia Assessment & Plan: Low carb diet and exercise.  Follow lipid panel and liver function tests.   Orders: -     Hepatic function panel; Future -     Lipid panel;  Future  Abnormal liver function tests Assessment & Plan: Has been worked up by GI.  S/p liver biopsy.  Discussed diet and exercise.  Weight loss.  Follow liver function tests.    Anemia, unspecified type Assessment & Plan: EGD and 01/2019 - results (per overview). Colonoscopy 09/2021 - f/u 5 years. Follow cbc.    Chronic kidney disease (CKD), stage IV (severe) [facility]) Assessment & Plan: 01/18/22 - Dr Alfonse Alpers - transplant listed as inactive.  (Activation when GFR consistently <15).    Gastroesophageal reflux disease, unspecified whether esophagitis present Assessment & Plan: Acid reflux controlled.  Omeprazole.    Health care maintenance Assessment & Plan: Physical today 04/28/22.  PAP today 04/28/22.   Colonoscopy 09/2021. Marland Kitchen  Recommended f/u in 5 years.  Mammogram 03/02/22 - Birads I.     Metabolic syndrome Assessment & Plan: Low carb diet and weight loss.  Follow.       Einar Pheasant, MD

## 2022-05-01 ENCOUNTER — Encounter: Payer: Self-pay | Admitting: Internal Medicine

## 2022-05-01 NOTE — Assessment & Plan Note (Signed)
Low carb diet and weight loss.  Follow.

## 2022-05-01 NOTE — Assessment & Plan Note (Signed)
Has been worked up by GI.  S/p liver biopsy.  Discussed diet and exercise.  Weight loss.  Follow liver function tests.

## 2022-05-01 NOTE — Assessment & Plan Note (Signed)
Acid reflux controlled.  Omeprazole.

## 2022-05-01 NOTE — Assessment & Plan Note (Addendum)
EGD and 01/2019 - results (per overview). Colonoscopy 09/2021 - f/u 5 years. Follow cbc.

## 2022-05-01 NOTE — Assessment & Plan Note (Signed)
Blood pressure as outlined.   Spot check pressure.  Continue metoprolol.  Follow metabolic panel.  Send in readings.

## 2022-05-01 NOTE — Assessment & Plan Note (Signed)
Physical today 04/28/22.  PAP today 04/28/22.   Colonoscopy 09/2021. Marland Kitchen  Recommended f/u in 5 years.  Mammogram 03/02/22 - Birads I.

## 2022-05-01 NOTE — Assessment & Plan Note (Signed)
Nephrology - changed to ozempic - improved CV, NAFLD and weight loss endpoints. Low carb diet and exercise.  Follow met b and A1c,

## 2022-05-01 NOTE — Assessment & Plan Note (Signed)
Low carb diet and exercise.  Follow lipid panel and liver function tests.

## 2022-05-01 NOTE — Assessment & Plan Note (Signed)
01/18/22 - Dr Alfonse Alpers - transplant listed as inactive.  (Activation when GFR consistently <15).

## 2022-05-04 LAB — CYTOLOGY - PAP
Comment: NEGATIVE
Diagnosis: NEGATIVE
High risk HPV: NEGATIVE

## 2022-05-24 ENCOUNTER — Other Ambulatory Visit: Payer: Self-pay | Admitting: Gastroenterology

## 2022-05-24 DIAGNOSIS — K760 Fatty (change of) liver, not elsewhere classified: Secondary | ICD-10-CM

## 2022-05-31 ENCOUNTER — Ambulatory Visit
Admission: RE | Admit: 2022-05-31 | Discharge: 2022-05-31 | Disposition: A | Discharge status: Home / Self Care | Payer: BC Managed Care – PPO | Source: Ambulatory Visit | Attending: Gastroenterology | Admitting: Gastroenterology

## 2022-05-31 DIAGNOSIS — K760 Fatty (change of) liver, not elsewhere classified: Secondary | ICD-10-CM | POA: Diagnosis present

## 2022-06-14 ENCOUNTER — Other Ambulatory Visit: Payer: Self-pay | Admitting: Internal Medicine

## 2022-08-24 ENCOUNTER — Other Ambulatory Visit (INDEPENDENT_AMBULATORY_CARE_PROVIDER_SITE_OTHER): Payer: BC Managed Care – PPO

## 2022-08-24 DIAGNOSIS — E781 Pure hyperglyceridemia: Secondary | ICD-10-CM

## 2022-08-24 DIAGNOSIS — I10 Essential (primary) hypertension: Secondary | ICD-10-CM | POA: Diagnosis not present

## 2022-08-24 DIAGNOSIS — R739 Hyperglycemia, unspecified: Secondary | ICD-10-CM | POA: Diagnosis not present

## 2022-08-24 LAB — HEPATIC FUNCTION PANEL
ALT: 19 U/L (ref 0–35)
AST: 24 U/L (ref 0–37)
Albumin: 4 g/dL (ref 3.5–5.2)
Alkaline Phosphatase: 129 U/L — ABNORMAL HIGH (ref 39–117)
Bilirubin, Direct: 0.1 mg/dL (ref 0.0–0.3)
Total Bilirubin: 0.6 mg/dL (ref 0.2–1.2)
Total Protein: 7.1 g/dL (ref 6.0–8.3)

## 2022-08-24 LAB — CBC WITH DIFFERENTIAL/PLATELET
Basophils Absolute: 0 10*3/uL (ref 0.0–0.1)
Basophils Relative: 0.6 % (ref 0.0–3.0)
Eosinophils Absolute: 0.2 10*3/uL (ref 0.0–0.7)
Eosinophils Relative: 2.6 % (ref 0.0–5.0)
HCT: 36.1 % (ref 36.0–46.0)
Hemoglobin: 12.1 g/dL (ref 12.0–15.0)
Lymphocytes Relative: 37.8 % (ref 12.0–46.0)
Lymphs Abs: 2.8 10*3/uL (ref 0.7–4.0)
MCHC: 33.4 g/dL (ref 30.0–36.0)
MCV: 94.3 fl (ref 78.0–100.0)
Monocytes Absolute: 0.7 10*3/uL (ref 0.1–1.0)
Monocytes Relative: 9.6 % (ref 3.0–12.0)
Neutro Abs: 3.6 10*3/uL (ref 1.4–7.7)
Neutrophils Relative %: 49.4 % (ref 43.0–77.0)
Platelets: 182 10*3/uL (ref 150.0–400.0)
RBC: 3.83 Mil/uL — ABNORMAL LOW (ref 3.87–5.11)
RDW: 13.7 % (ref 11.5–15.5)
WBC: 7.3 10*3/uL (ref 4.0–10.5)

## 2022-08-24 LAB — BASIC METABOLIC PANEL
BUN: 47 mg/dL — ABNORMAL HIGH (ref 6–23)
CO2: 25 mEq/L (ref 19–32)
Calcium: 9.5 mg/dL (ref 8.4–10.5)
Chloride: 105 mEq/L (ref 96–112)
Creatinine, Ser: 2.23 mg/dL — ABNORMAL HIGH (ref 0.40–1.20)
GFR: 22.89 mL/min — ABNORMAL LOW (ref >60.00)
Glucose, Bld: 107 mg/dL — ABNORMAL HIGH (ref 70–99)
Potassium: 3.8 mEq/L (ref 3.5–5.1)
Sodium: 139 mEq/L (ref 135–145)

## 2022-08-24 LAB — LIPID PANEL
Cholesterol: 149 mg/dL (ref 0–200)
HDL: 43.5 mg/dL (ref >39.00)
NonHDL: 105.13
Total CHOL/HDL Ratio: 3
Triglycerides: 305 mg/dL — ABNORMAL HIGH (ref 0.0–149.0)
VLDL: 61 mg/dL — ABNORMAL HIGH (ref 0.0–40.0)

## 2022-08-24 LAB — LDL CHOLESTEROL, DIRECT: Direct LDL: 53 mg/dL

## 2022-08-24 LAB — HEMOGLOBIN A1C: Hgb A1c MFr Bld: 5.8 % (ref 4.6–6.5)

## 2022-08-24 LAB — TSH: TSH: 3.3 u[IU]/mL (ref 0.35–5.50)

## 2022-08-29 ENCOUNTER — Ambulatory Visit: Payer: BC Managed Care – PPO | Admitting: Internal Medicine

## 2022-09-01 ENCOUNTER — Ambulatory Visit: Payer: BC Managed Care – PPO | Admitting: Internal Medicine

## 2022-09-01 VITALS — BP 118/78 | HR 75 | Temp 98.1°F | Ht 61.0 in | Wt 171.0 lb

## 2022-09-01 DIAGNOSIS — N184 Chronic kidney disease, stage 4 (severe): Secondary | ICD-10-CM | POA: Diagnosis not present

## 2022-09-01 DIAGNOSIS — R7989 Other specified abnormal findings of blood chemistry: Secondary | ICD-10-CM | POA: Diagnosis not present

## 2022-09-01 DIAGNOSIS — D649 Anemia, unspecified: Secondary | ICD-10-CM | POA: Diagnosis not present

## 2022-09-01 DIAGNOSIS — M109 Gout, unspecified: Secondary | ICD-10-CM

## 2022-09-01 DIAGNOSIS — K219 Gastro-esophageal reflux disease without esophagitis: Secondary | ICD-10-CM

## 2022-09-01 DIAGNOSIS — E781 Pure hyperglyceridemia: Secondary | ICD-10-CM

## 2022-09-01 DIAGNOSIS — S8990XS Unspecified injury of unspecified lower leg, sequela: Secondary | ICD-10-CM

## 2022-09-01 DIAGNOSIS — I10 Essential (primary) hypertension: Secondary | ICD-10-CM

## 2022-09-01 NOTE — Progress Notes (Signed)
Subjective:    Patient ID: Angel Collins, female    DOB: [birth year], 62 y.o.   MRN: [id number]  Patient here for a scheduled follow up.   HPI Here for a scheduled follow up - f/u regarding hypertension, hypercholesterolemia and blood sugar.  On ozempic.  Tries to stay active.  No chest pain or sob reported.  No cough or congestion.  No abdominal pain or bowel change reported.  Bookcase fell and hit her leg - 11 days ago.  Bruised.  Bruising and swelling - getting better.  Has f/u with nephrology 09/16/22.  A1c improved - 5.8.  handling stress.    Past Medical History:  Diagnosis Date   Abnormal liver function    fatty liver   Chronic kidney disease    medullary cystic kidney disease   Gout    Hypercholesterolemia    Hyperglycemia    Hypertension    Past Surgical History:  Procedure Laterality Date   CESAREAN SECTION  1983   ovarian cyst removed  1979   TUBAL LIGATION  1987   Family History  Problem Relation Age of Onset   Lung cancer Mother        died age 104   Liver cancer Mother    Liver cancer Father    Hypertension Brother    Hypercholesterolemia Brother    Gout Brother    Breast cancer Neg Hx    Social History   Socioeconomic History   Marital status: Married    Spouse name: Not on file   Number of children: 3   Years of education: Not on file   Highest education level: 12th grade  Occupational History   Not on file  Tobacco Use   Smoking status: Never   Smokeless tobacco: Never  Substance and Sexual Activity   Alcohol use: Yes    Alcohol/week: 0.0 standard drinks of alcohol    Comment: occasional   Drug use: No   Sexual activity: Not on file  Other Topics Concern   Not on file  Social History Narrative   Not on file   Social Determinants of Health   Financial Resource Strain: Low Risk  (08/28/2022)   Overall Financial Resource Strain (CARDIA)    Difficulty of Paying Living Expenses: Not hard at all  Food Insecurity: No Food Insecurity  (08/28/2022)   Hunger Vital Sign    Worried About Running Out of Food in the Last Year: Never true    Ran Out of Food in the Last Year: Never true  Transportation Needs: No Transportation Needs (08/28/2022)   PRAPARE - Administrator, Civil Service (Medical): No    Lack of Transportation (Non-Medical): No  Physical Activity: Insufficiently Active (08/28/2022)   Exercise Vital Sign    Days of Exercise per Week: 3 days    Minutes of Exercise per Session: 20 min  Stress: No Stress Concern Present (08/28/2022)   [employer] of Occupational Health - Occupational Stress Questionnaire    Feeling of Stress : Not at all  Social Connections: Moderately Integrated (08/28/2022)   Social Connection and Isolation Panel [NHANES]    Frequency of Communication with Friends and Family: More than three times a week    Frequency of Social Gatherings with Friends and Family: More than three times a week    Attends Religious Services: More than 4 times per year    Active Member of [employer] or Organizations: No    Attends Banker Meetings: Not  on file    Marital Status: Married     Review of Systems  Constitutional:  Negative for appetite change and unexpected weight change.  HENT:  Negative for congestion and sinus pressure.   Respiratory:  Negative for cough, chest tightness and shortness of breath.   Cardiovascular:  Negative for chest pain, palpitations and leg swelling.  Gastrointestinal:  Negative for abdominal pain, diarrhea, nausea and vomiting.  Genitourinary:  Negative for difficulty urinating and dysuria.  Musculoskeletal:  Negative for joint swelling and myalgias.  Skin:  Negative for color change and rash.  Neurological:  Negative for dizziness and headaches.  Psychiatric/Behavioral:  Negative for agitation and dysphoric mood.        Objective:     BP 118/78 (BP Location: Left Arm, Patient Position: Sitting, Cuff Size: Normal)   Pulse 75   Temp 98.1 F (36.7  C) (Oral)   Ht 5\' 1"  (1.549 m)   Wt 171 lb (77.6 kg)   SpO2 94%   BMI 32.31 kg/m  Wt Readings from Last 3 Encounters:  09/01/22 171 lb (77.6 kg)  04/28/22 163 lb 12.8 oz (74.3 kg)  03/29/22 173 lb (78.5 kg)    Physical Exam Vitals reviewed.  Constitutional:      General: She is not in acute distress.    Appearance: Normal appearance.  HENT:     Head: Normocephalic and atraumatic.     Right Ear: External ear normal.     Left Ear: External ear normal.  Eyes:     General: No scleral icterus.       Right eye: No discharge.        Left eye: No discharge.     Conjunctiva/sclera: Conjunctivae normal.  Neck:     Thyroid: No thyromegaly.  Cardiovascular:     Rate and Rhythm: Normal rate and regular rhythm.  Pulmonary:     Effort: No respiratory distress.     Breath sounds: Normal breath sounds. No wheezing.  Abdominal:     General: Bowel sounds are normal.     Palpations: Abdomen is soft.     Tenderness: There is no abdominal tenderness.  Musculoskeletal:        General: No swelling or tenderness.     Cervical back: Neck supple. No tenderness.  Lymphadenopathy:     Cervical: No cervical adenopathy.  Skin:    Findings: No erythema or rash.  Neurological:     Mental Status: She is alert.  Psychiatric:        Mood and Affect: Mood normal.        Behavior: Behavior normal.      Outpatient Encounter Medications as of 09/01/2022  Medication Sig   aspirin 81 MG tablet Take 81 mg by mouth daily.   famotidine (PEPCID) 20 MG tablet Take 20 mg by mouth 2 (two) times daily.    febuxostat (ULORIC) 40 MG tablet TAKE 1 TABLET BY MOUTH EVERY DAY   furosemide (LASIX) 40 MG tablet Take 40 mg by mouth daily.   metoprolol tartrate (LOPRESSOR) 50 MG tablet TAKE 1 TABLET BY MOUTH TWICE A DAY   OZEMPIC, 0.25 OR 0.5 MG/DOSE, 2 MG/3ML SOPN Inject 1 mg into the skin once a week.   rosuvastatin (CRESTOR) 5 MG tablet TAKE 1 TABLET BY MOUTH EVERY DAY   vitamin E 400 UNIT capsule Take 400  Units by mouth 2 (two) times daily.   No facility-administered encounter medications on file as of 09/01/2022.     Lab Results  Component Value Date   WBC 7.3 08/24/2022   HGB 12.1 08/24/2022   HCT 36.1 08/24/2022   PLT 182.0 08/24/2022   GLUCOSE 107 (H) 08/24/2022   CHOL 149 08/24/2022   TRIG 305.0 (H) 08/24/2022   HDL 43.50 08/24/2022   LDLDIRECT 53.0 08/24/2022   LDLCALC 44 01/22/2020   ALT 19 08/24/2022   AST 24 08/24/2022   NA 139 08/24/2022   K 3.8 08/24/2022   CL 105 08/24/2022   CREATININE 2.23 (H) 08/24/2022   BUN 47 (H) 08/24/2022   CO2 25 08/24/2022   TSH 3.30 08/24/2022   HGBA1C 5.8 08/24/2022    [place] ELASTOGRAPHY LIVER  Result Date: 05/31/2022 CLINICAL DATA:  Fatty liver EXAM: ULTRASOUND ABDOMEN ULTRASOUND HEPATIC ELASTOGRAPHY TECHNIQUE: Sonography of the upper abdomen was performed. In addition, ultrasound elastography evaluation of the liver was performed. A region of interest was placed within the right lobe of the liver. Following application of a compressive sonographic pulse, tissue compressibility was assessed. Multiple assessments were performed at the selected site. Median tissue compressibility was determined. Previously, hepatic stiffness was assessed by shear wave velocity. Based on recently published Society of Radiologists in Ultrasound consensus article, reporting is now recommended to be performed in the SI units of pressure (kiloPascals) representing hepatic stiffness/elasticity. The obtained result is compared to the published reference standards. (cACLD = compensated Advanced Chronic Liver Disease) COMPARISON:  02/03/2021 FINDINGS: ULTRASOUND ABDOMEN Gallbladder: No gallstones or wall thickening visualized. No sonographic Murphy sign noted by sonographer. Common bile duct: Diameter: Normal caliber, 4 mm Liver: Increased echotexture compatible with fatty infiltration. No focal abnormality or biliary ductal dilatation. Portal vein is patent on color Doppler  imaging with normal direction of blood flow towards the liver. IVC: No abnormality visualized. Pancreas: Visualized portion unremarkable. Spleen: Size and appearance within normal limits. Right Kidney: Length: 10.2 cm. Echogenicity within normal limits. No mass or hydronephrosis visualized. 8 mm upper pole cyst. Left Kidney: Length: 9.0 cm. Echogenicity within normal limits. No mass or hydronephrosis visualized. 1.4 cm lower pole cyst. Abdominal aorta: No aneurysm visualized. Other findings: None. ULTRASOUND HEPATIC ELASTOGRAPHY Device: Siemens Helix VTQ Patient position: Supine Transducer DAX Number of measurements: 15 Hepatic segment:  8 Median kPa: 1.9 IQR: 0.7 IQR/Median kPa ratio: 0.37 Data quality: IQR/Median kPa ratio of 0.3 or greater indicates reduced accuracy Diagnostic category:  < or = 5 kPa: high probability of being normal The use of hepatic elastography is applicable to patients with viral hepatitis and non-alcoholic fatty liver disease. At this time, there is insufficient data for the referenced cut-off values and use in other causes of liver disease, including alcoholic liver disease. Patients, however, may be assessed by elastography and serve as their own reference standard/baseline. In patients with non-alcoholic liver disease, the values suggesting compensated advanced chronic liver disease (cACLD) may be lower, and patients may need additional testing with elasticity results of 7-9 kPa. Please note that abnormal hepatic elasticity and shear wave velocities may also be identified in clinical settings other than with hepatic fibrosis, such as: acute hepatitis, elevated right heart and central venous pressures including use of beta blockers, veno-occlusive disease (Budd-Chiari), infiltrative processes such as mastocytosis/amyloidosis/infiltrative tumor/lymphoma, extrahepatic cholestasis, with hyperemia in the post-prandial state, and with liver transplantation. Correlation with patient history,  laboratory data, and clinical condition recommended. Diagnostic Categories: < or =5 kPa: high probability of being normal < or =9 kPa: in the absence of other known clinical signs, rules out cACLD >9 kPa and ?13 kPa: suggestive of  cACLD, but needs further testing >13 kPa: highly suggestive of cACLD > or =17 kPa: highly suggestive of cACLD with an increased probability of clinically significant portal hypertension IMPRESSION: ULTRASOUND ABDOMEN: Fatty infiltration of the liver. ULTRASOUND HEPATIC ELASTOGRAPHY: Median kPa:  1.9 Diagnostic category:  < or = 5 kPa: high probability of being normal Electronically Signed   By: Charlett Nose M.D.   On: 05/31/2022 11:45   US Abdomen Complete  Result Date: 05/31/2022 CLINICAL DATA:  Fatty liver EXAM: ULTRASOUND ABDOMEN ULTRASOUND HEPATIC ELASTOGRAPHY TECHNIQUE: Sonography of the upper abdomen was performed. In addition, ultrasound elastography evaluation of the liver was performed. A region of interest was placed within the right lobe of the liver. Following application of a compressive sonographic pulse, tissue compressibility was assessed. Multiple assessments were performed at the selected site. Median tissue compressibility was determined. Previously, hepatic stiffness was assessed by shear wave velocity. Based on recently published Society of Radiologists in Ultrasound consensus article, reporting is now recommended to be performed in the SI units of pressure (kiloPascals) representing hepatic stiffness/elasticity. The obtained result is compared to the published reference standards. (cACLD = compensated Advanced Chronic Liver Disease) COMPARISON:  02/03/2021 FINDINGS: ULTRASOUND ABDOMEN Gallbladder: No gallstones or wall thickening visualized. No sonographic Murphy sign noted by sonographer. Common bile duct: Diameter: Normal caliber, 4 mm Liver: Increased echotexture compatible with fatty infiltration. No focal abnormality or biliary ductal dilatation. Portal vein  is patent on color Doppler imaging with normal direction of blood flow towards the liver. IVC: No abnormality visualized. Pancreas: Visualized portion unremarkable. Spleen: Size and appearance within normal limits. Right Kidney: Length: 10.2 cm. Echogenicity within normal limits. No mass or hydronephrosis visualized. 8 mm upper pole cyst. Left Kidney: Length: 9.0 cm. Echogenicity within normal limits. No mass or hydronephrosis visualized. 1.4 cm lower pole cyst. Abdominal aorta: No aneurysm visualized. Other findings: None. ULTRASOUND HEPATIC ELASTOGRAPHY Device: Siemens Helix VTQ Patient position: Supine Transducer DAX Number of measurements: 15 Hepatic segment:  8 Median kPa: 1.9 IQR: 0.7 IQR/Median kPa ratio: 0.37 Data quality: IQR/Median kPa ratio of 0.3 or greater indicates reduced accuracy Diagnostic category:  < or = 5 kPa: high probability of being normal The use of hepatic elastography is applicable to patients with viral hepatitis and non-alcoholic fatty liver disease. At this time, there is insufficient data for the referenced cut-off values and use in other causes of liver disease, including alcoholic liver disease. Patients, however, may be assessed by elastography and serve as their own reference standard/baseline. In patients with non-alcoholic liver disease, the values suggesting compensated advanced chronic liver disease (cACLD) may be lower, and patients may need additional testing with elasticity results of 7-9 kPa. Please note that abnormal hepatic elasticity and shear wave velocities may also be identified in clinical settings other than with hepatic fibrosis, such as: acute hepatitis, elevated right heart and central venous pressures including use of beta blockers, veno-occlusive disease (Budd-Chiari), infiltrative processes such as mastocytosis/amyloidosis/infiltrative tumor/lymphoma, extrahepatic cholestasis, with hyperemia in the post-prandial state, and with liver transplantation.  Correlation with patient history, laboratory data, and clinical condition recommended. Diagnostic Categories: < or =5 kPa: high probability of being normal < or =9 kPa: in the absence of other known clinical signs, rules out cACLD >9 kPa and ?13 kPa: suggestive of cACLD, but needs further testing >13 kPa: highly suggestive of cACLD > or =17 kPa: highly suggestive of cACLD with an increased probability of clinically significant portal hypertension IMPRESSION: ULTRASOUND ABDOMEN: Fatty infiltration of the liver. ULTRASOUND HEPATIC  ELASTOGRAPHY: Median kPa:  1.9 Diagnostic category:  < or = 5 kPa: high probability of being normal Electronically Signed   By: Charlett Nose M.D.   On: 05/31/2022 11:45       Assessment & Plan:  Abnormal liver function tests Assessment & Plan: Has been worked up by GI.  S/p liver biopsy.  Discussed diet and exercise.  Weight loss.  Follow liver function tests.    Anemia, unspecified type Assessment & Plan: EGD and 01/2019 - results (per overview). Colonoscopy 09/2021 - f/u 5 years. Follow cbc.    Chronic kidney disease (CKD), stage IV (severe) [facility]) Assessment & Plan: 01/18/22 - Dr Arvin Collard - transplant listed as inactive.  (Activation when GFR consistently <15).  Has f/u 09/16/22.    Gastroesophageal reflux disease, unspecified whether esophagitis present Assessment & Plan: Acid reflux controlled.  Omeprazole.    Gout, unspecified cause, unspecified chronicity, unspecified site Assessment & Plan: On Uloric. No flares.      Primary hypertension Assessment & Plan: Blood pressure as outlined.   Continue metoprolol.  Follow metabolic panel.    Hypertriglyceridemia Assessment & Plan: Low carb diet and exercise.  Follow lipid panel and liver function tests.    Injury of lower extremity, unspecified laterality, sequela Assessment & Plan: Bookcase fell on her leg.  Bruising and swelling improving.  Follow.  Notify if any problems.       Dale Pana,  MD

## 2022-09-04 ENCOUNTER — Other Ambulatory Visit: Payer: Self-pay | Admitting: Family

## 2022-09-11 ENCOUNTER — Encounter: Payer: Self-pay | Admitting: Internal Medicine

## 2022-09-11 DIAGNOSIS — S8990XA Unspecified injury of unspecified lower leg, initial encounter: Secondary | ICD-10-CM | POA: Insufficient documentation

## 2022-09-11 NOTE — Assessment & Plan Note (Signed)
Acid reflux controlled.  Omeprazole.  

## 2022-09-11 NOTE — Assessment & Plan Note (Signed)
EGD and 01/2019 - results (per overview). Colonoscopy 09/2021 - f/u 5 years. Follow cbc.  

## 2022-09-11 NOTE — Assessment & Plan Note (Signed)
Has been worked up by GI.  S/p liver biopsy.  Discussed diet and exercise.  Weight loss.  Follow liver function tests.   

## 2022-09-11 NOTE — Assessment & Plan Note (Signed)
Bookcase fell on her leg.  Bruising and swelling improving.  Follow.  Notify if any problems.

## 2022-09-11 NOTE — Assessment & Plan Note (Signed)
01/18/22 - Dr Arvin Collard - transplant listed as inactive.  (Activation when GFR consistently <15).  Has f/u 09/16/22.

## 2022-09-11 NOTE — Assessment & Plan Note (Signed)
On Uloric. No flares.       

## 2022-09-11 NOTE — Assessment & Plan Note (Signed)
Low carb diet and exercise.  Follow lipid panel and liver function tests.  

## 2022-09-11 NOTE — Assessment & Plan Note (Signed)
Blood pressure as outlined.   Continue metoprolol.  Follow metabolic panel.

## 2022-10-03 ENCOUNTER — Other Ambulatory Visit: Payer: Self-pay | Admitting: Internal Medicine

## 2022-11-24 ENCOUNTER — Encounter (INDEPENDENT_AMBULATORY_CARE_PROVIDER_SITE_OTHER): Payer: Self-pay

## 2022-12-21 ENCOUNTER — Telehealth: Payer: Self-pay | Admitting: Internal Medicine

## 2022-12-21 DIAGNOSIS — I10 Essential (primary) hypertension: Secondary | ICD-10-CM

## 2022-12-21 DIAGNOSIS — E781 Pure hyperglyceridemia: Secondary | ICD-10-CM

## 2022-12-21 DIAGNOSIS — R739 Hyperglycemia, unspecified: Secondary | ICD-10-CM

## 2022-12-21 NOTE — Telephone Encounter (Signed)
Future labs ordered.  

## 2022-12-21 NOTE — Telephone Encounter (Signed)
Patient need lab orders.

## 2022-12-28 ENCOUNTER — Other Ambulatory Visit (INDEPENDENT_AMBULATORY_CARE_PROVIDER_SITE_OTHER): Payer: BC Managed Care – PPO

## 2022-12-28 DIAGNOSIS — E781 Pure hyperglyceridemia: Secondary | ICD-10-CM | POA: Diagnosis not present

## 2022-12-28 DIAGNOSIS — R739 Hyperglycemia, unspecified: Secondary | ICD-10-CM | POA: Diagnosis not present

## 2022-12-28 DIAGNOSIS — I10 Essential (primary) hypertension: Secondary | ICD-10-CM

## 2022-12-28 LAB — CBC WITH DIFFERENTIAL/PLATELET
Basophils Absolute: 0 10*3/uL (ref 0.0–0.1)
Basophils Relative: 0.6 % (ref 0.0–3.0)
Eosinophils Absolute: 0.2 10*3/uL (ref 0.0–0.7)
Eosinophils Relative: 3.3 % (ref 0.0–5.0)
HCT: 36.1 % (ref 36.0–46.0)
Hemoglobin: 11.9 g/dL — ABNORMAL LOW (ref 12.0–15.0)
Lymphocytes Relative: 35.4 % (ref 12.0–46.0)
Lymphs Abs: 2.4 10*3/uL (ref 0.7–4.0)
MCHC: 32.9 g/dL (ref 30.0–36.0)
MCV: 95.3 fl (ref 78.0–100.0)
Monocytes Absolute: 0.7 10*3/uL (ref 0.1–1.0)
Monocytes Relative: 10.3 % (ref 3.0–12.0)
Neutro Abs: 3.4 10*3/uL (ref 1.4–7.7)
Neutrophils Relative %: 50.4 % (ref 43.0–77.0)
Platelets: 177 10*3/uL (ref 150.0–400.0)
RBC: 3.79 Mil/uL — ABNORMAL LOW (ref 3.87–5.11)
RDW: 13.7 % (ref 11.5–15.5)
WBC: 6.8 10*3/uL (ref 4.0–10.5)

## 2022-12-28 LAB — LIPID PANEL
Cholesterol: 154 mg/dL (ref 0–200)
HDL: 45.1 mg/dL (ref >39.00)
LDL Cholesterol: 48 mg/dL (ref 0–99)
NonHDL: 108.98
Total CHOL/HDL Ratio: 3
Triglycerides: 307 mg/dL — ABNORMAL HIGH (ref 0.0–149.0)
VLDL: 61.4 mg/dL — ABNORMAL HIGH (ref 0.0–40.0)

## 2022-12-28 LAB — BASIC METABOLIC PANEL WITH GFR
BUN: 50 mg/dL — ABNORMAL HIGH (ref 6–23)
CO2: 25 meq/L (ref 19–32)
Calcium: 9.4 mg/dL (ref 8.4–10.5)
Chloride: 106 meq/L (ref 96–112)
Creatinine, Ser: 2.53 mg/dL — ABNORMAL HIGH (ref 0.40–1.20)
GFR: 19.62 mL/min — ABNORMAL LOW (ref >60.00)
Glucose, Bld: 114 mg/dL — ABNORMAL HIGH (ref 70–99)
Potassium: 4 meq/L (ref 3.5–5.1)
Sodium: 140 meq/L (ref 135–145)

## 2022-12-28 LAB — HEPATIC FUNCTION PANEL
ALT: 27 U/L (ref 0–35)
AST: 24 U/L (ref 0–37)
Albumin: 4 g/dL (ref 3.5–5.2)
Alkaline Phosphatase: 137 U/L — ABNORMAL HIGH (ref 39–117)
Bilirubin, Direct: 0.1 mg/dL (ref 0.0–0.3)
Total Bilirubin: 0.5 mg/dL (ref 0.2–1.2)
Total Protein: 6.9 g/dL (ref 6.0–8.3)

## 2022-12-28 LAB — HEMOGLOBIN A1C: Hgb A1c MFr Bld: 6.2 % (ref 4.6–6.5)

## 2023-01-02 ENCOUNTER — Encounter: Payer: Self-pay | Admitting: Internal Medicine

## 2023-01-02 ENCOUNTER — Ambulatory Visit: Payer: BC Managed Care – PPO | Admitting: Internal Medicine

## 2023-01-02 VITALS — BP 120/70 | HR 61 | Temp 97.9°F | Resp 17 | Ht 61.0 in | Wt 179.5 lb

## 2023-01-02 DIAGNOSIS — Z23 Encounter for immunization: Secondary | ICD-10-CM

## 2023-01-02 DIAGNOSIS — K219 Gastro-esophageal reflux disease without esophagitis: Secondary | ICD-10-CM | POA: Diagnosis not present

## 2023-01-02 DIAGNOSIS — I10 Essential (primary) hypertension: Secondary | ICD-10-CM

## 2023-01-02 DIAGNOSIS — D649 Anemia, unspecified: Secondary | ICD-10-CM

## 2023-01-02 DIAGNOSIS — Z1231 Encounter for screening mammogram for malignant neoplasm of breast: Secondary | ICD-10-CM

## 2023-01-02 DIAGNOSIS — N184 Chronic kidney disease, stage 4 (severe): Secondary | ICD-10-CM

## 2023-01-02 DIAGNOSIS — E8881 Metabolic syndrome: Secondary | ICD-10-CM

## 2023-01-02 DIAGNOSIS — E781 Pure hyperglyceridemia: Secondary | ICD-10-CM

## 2023-01-02 DIAGNOSIS — K760 Fatty (change of) liver, not elsewhere classified: Secondary | ICD-10-CM

## 2023-01-02 DIAGNOSIS — R7989 Other specified abnormal findings of blood chemistry: Secondary | ICD-10-CM

## 2023-01-02 DIAGNOSIS — R739 Hyperglycemia, unspecified: Secondary | ICD-10-CM

## 2023-01-02 NOTE — Progress Notes (Signed)
Subjective:    Patient ID: Angel Collins, female    DOB: 1961/02/17, 62 y.o.   MRN: [id number]  Patient here for  Chief Complaint  Patient presents with   Medical Management of Chronic Issues    4 mth f/u    HPI Here for a scheduled follow up - f/u regarding hypertension, hypercholesterolemia and blood sugar. Had been on ozempic. Off now. Working on diet and exercise. Tries to stay active. No chest pain or sob reported.  No cough or congestion.  No abdominal pain or bowel change reported. Had f/u with nephrology - 09/20/22.  GFR 23.  Stable.  Saw GI 11/22/22 - f/u fatty liver. Stable.  Due f/u colonoscopy 2028. F/u ortho - ankle.     Past Medical History:  Diagnosis Date   Abnormal liver function    fatty liver   Chronic kidney disease    medullary cystic kidney disease   Gout    Hypercholesterolemia    Hyperglycemia    Hypertension    Past Surgical History:  Procedure Laterality Date   CESAREAN SECTION  1983   ovarian cyst removed  1979   TUBAL LIGATION  1987   Family History  Problem Relation Age of Onset   Lung cancer Mother        died age 61   Liver cancer Mother    Liver cancer Father    Hypertension Brother    Hypercholesterolemia Brother    Gout Brother    Breast cancer Neg Hx    Social History   Socioeconomic History   Marital status: Married    Spouse name: Not on file   Number of children: 3   Years of education: Not on file   Highest education level: 12th grade  Occupational History   Not on file  Tobacco Use   Smoking status: Never   Smokeless tobacco: Never  Substance and Sexual Activity   Alcohol use: Yes    Alcohol/week: 0.0 standard drinks of alcohol    Comment: occasional   Drug use: No   Sexual activity: Not on file  Other Topics Concern   Not on file  Social History Narrative   Not on file   Social Determinants of Health   Financial Resource Strain: Low Risk  (08/28/2022)   Overall Financial Resource Strain (CARDIA)     Difficulty of Paying Living Expenses: Not hard at all  Food Insecurity: No Food Insecurity (08/28/2022)   Hunger Vital Sign    Worried About Running Out of Food in the Last Year: Never true    Ran Out of Food in the Last Year: Never true  Transportation Needs: No Transportation Needs (08/28/2022)   PRAPARE - Administrator, Civil Service (Medical): No    Lack of Transportation (Non-Medical): No  Physical Activity: Insufficiently Active (08/28/2022)   Exercise Vital Sign    Days of Exercise per Week: 3 days    Minutes of Exercise per Session: 20 min  Stress: No Stress Concern Present (08/28/2022)   [employer] of Occupational Health - Occupational Stress Questionnaire    Feeling of Stress : Not at all  Social Connections: Moderately Integrated (08/28/2022)   Social Connection and Isolation Panel [NHANES]    Frequency of Communication with Friends and Family: More than three times a week    Frequency of Social Gatherings with Friends and Family: More than three times a week    Attends Religious Services: More than 4 times per year  Active Member of Clubs or Organizations: No    Attends Banker Meetings: Not on file    Marital Status: Married     Review of Systems  Constitutional:  Negative for appetite change and unexpected weight change.  HENT:  Negative for congestion and sinus pressure.   Respiratory:  Negative for cough, chest tightness and shortness of breath.   Cardiovascular:  Negative for chest pain and palpitations.  Gastrointestinal:  Negative for abdominal pain, diarrhea, nausea and vomiting.  Genitourinary:  Negative for difficulty urinating and dysuria.  Musculoskeletal:  Negative for joint swelling and myalgias.  Skin:  Negative for color change and rash.  Neurological:  Negative for dizziness and headaches.  Psychiatric/Behavioral:  Negative for agitation and dysphoric mood.        Objective:     BP 120/70   Pulse 61   Temp 97.9 F  (36.6 C) (Oral)   Resp 17   Ht 5\' 1"  (1.549 m)   Wt 179 lb 8 oz (81.4 kg)   SpO2 95%   BMI 33.92 kg/m  Wt Readings from Last 3 Encounters:  01/02/23 179 lb 8 oz (81.4 kg)  09/01/22 171 lb (77.6 kg)  04/28/22 163 lb 12.8 oz (74.3 kg)    Physical Exam Vitals reviewed.  Constitutional:      General: She is not in acute distress.    Appearance: Normal appearance.  HENT:     Head: Normocephalic and atraumatic.     Right Ear: External ear normal.     Left Ear: External ear normal.  Eyes:     General: No scleral icterus.       Right eye: No discharge.        Left eye: No discharge.     Conjunctiva/sclera: Conjunctivae normal.  Neck:     Thyroid: No thyromegaly.  Cardiovascular:     Rate and Rhythm: Normal rate and regular rhythm.  Pulmonary:     Effort: No respiratory distress.     Breath sounds: Normal breath sounds. No wheezing.  Abdominal:     General: Bowel sounds are normal.     Palpations: Abdomen is soft.     Tenderness: There is no abdominal tenderness.  Musculoskeletal:        General: No swelling or tenderness.     Cervical back: Neck supple. No tenderness.  Lymphadenopathy:     Cervical: No cervical adenopathy.  Skin:    Findings: No erythema or rash.  Neurological:     Mental Status: She is alert.  Psychiatric:        Mood and Affect: Mood normal.        Behavior: Behavior normal.      Outpatient Encounter Medications as of 01/02/2023  Medication Sig   aspirin 81 MG tablet Take 81 mg by mouth daily.   famotidine (PEPCID) 20 MG tablet Take 20 mg by mouth 2 (two) times daily.    febuxostat (ULORIC) 40 MG tablet TAKE 1 TABLET BY MOUTH EVERY DAY   furosemide (LASIX) 40 MG tablet Take 40 mg by mouth daily.   metoprolol tartrate (LOPRESSOR) 50 MG tablet TAKE 1 TABLET BY MOUTH TWICE A DAY   rosuvastatin (CRESTOR) 5 MG tablet TAKE 1 TABLET BY MOUTH EVERY DAY   vitamin E 400 UNIT capsule Take 400 Units by mouth 2 (two) times daily.   [DISCONTINUED] OZEMPIC,  0.25 OR 0.5 MG/DOSE, 2 MG/3ML SOPN Inject 1 mg into the skin once a week.   No facility-administered  encounter medications on file as of 01/02/2023.     Lab Results  Component Value Date   WBC 6.8 12/28/2022   HGB 11.9 (L) 12/28/2022   HCT 36.1 12/28/2022   PLT 177.0 12/28/2022   GLUCOSE 114 (H) 12/28/2022   CHOL 154 12/28/2022   TRIG 307.0 (H) 12/28/2022   HDL 45.10 12/28/2022   LDLDIRECT 53.0 08/24/2022   LDLCALC 48 12/28/2022   ALT 27 12/28/2022   AST 24 12/28/2022   NA 140 12/28/2022   K 4.0 12/28/2022   CL 106 12/28/2022   CREATININE 2.53 (H) 12/28/2022   BUN 50 (H) 12/28/2022   CO2 25 12/28/2022   TSH 3.30 08/24/2022   HGBA1C 6.2 12/28/2022    [place] ELASTOGRAPHY LIVER  Result Date: 05/31/2022 CLINICAL DATA:  Fatty liver EXAM: ULTRASOUND ABDOMEN ULTRASOUND HEPATIC ELASTOGRAPHY TECHNIQUE: Sonography of the upper abdomen was performed. In addition, ultrasound elastography evaluation of the liver was performed. A region of interest was placed within the right lobe of the liver. Following application of a compressive sonographic pulse, tissue compressibility was assessed. Multiple assessments were performed at the selected site. Median tissue compressibility was determined. Previously, hepatic stiffness was assessed by shear wave velocity. Based on recently published Society of Radiologists in Ultrasound consensus article, reporting is now recommended to be performed in the SI units of pressure (kiloPascals) representing hepatic stiffness/elasticity. The obtained result is compared to the published reference standards. (cACLD = compensated Advanced Chronic Liver Disease) COMPARISON:  02/03/2021 FINDINGS: ULTRASOUND ABDOMEN Gallbladder: No gallstones or wall thickening visualized. No sonographic Murphy sign noted by sonographer. Common bile duct: Diameter: Normal caliber, 4 mm Liver: Increased echotexture compatible with fatty infiltration. No focal abnormality or biliary ductal  dilatation. Portal vein is patent on color Doppler imaging with normal direction of blood flow towards the liver. IVC: No abnormality visualized. Pancreas: Visualized portion unremarkable. Spleen: Size and appearance within normal limits. Right Kidney: Length: 10.2 cm. Echogenicity within normal limits. No mass or hydronephrosis visualized. 8 mm upper pole cyst. Left Kidney: Length: 9.0 cm. Echogenicity within normal limits. No mass or hydronephrosis visualized. 1.4 cm lower pole cyst. Abdominal aorta: No aneurysm visualized. Other findings: None. ULTRASOUND HEPATIC ELASTOGRAPHY Device: Siemens Helix VTQ Patient position: Supine Transducer DAX Number of measurements: 15 Hepatic segment:  8 Median kPa: 1.9 IQR: 0.7 IQR/Median kPa ratio: 0.37 Data quality: IQR/Median kPa ratio of 0.3 or greater indicates reduced accuracy Diagnostic category:  < or = 5 kPa: high probability of being normal The use of hepatic elastography is applicable to patients with viral hepatitis and non-alcoholic fatty liver disease. At this time, there is insufficient data for the referenced cut-off values and use in other causes of liver disease, including alcoholic liver disease. Patients, however, may be assessed by elastography and serve as their own reference standard/baseline. In patients with non-alcoholic liver disease, the values suggesting compensated advanced chronic liver disease (cACLD) may be lower, and patients may need additional testing with elasticity results of 7-9 kPa. Please note that abnormal hepatic elasticity and shear wave velocities may also be identified in clinical settings other than with hepatic fibrosis, such as: acute hepatitis, elevated right heart and central venous pressures including use of beta blockers, veno-occlusive disease (Budd-Chiari), infiltrative processes such as mastocytosis/amyloidosis/infiltrative tumor/lymphoma, extrahepatic cholestasis, with hyperemia in the post-prandial state, and with liver  transplantation. Correlation with patient history, laboratory data, and clinical condition recommended. Diagnostic Categories: < or =5 kPa: high probability of being normal < or =9 kPa: in the absence  of other known clinical signs, rules out cACLD >9 kPa and ?13 kPa: suggestive of cACLD, but needs further testing >13 kPa: highly suggestive of cACLD > or =17 kPa: highly suggestive of cACLD with an increased probability of clinically significant portal hypertension IMPRESSION: ULTRASOUND ABDOMEN: Fatty infiltration of the liver. ULTRASOUND HEPATIC ELASTOGRAPHY: Median kPa:  1.9 Diagnostic category:  < or = 5 kPa: high probability of being normal Electronically Signed   By: Charlett Nose M.D.   On: 05/31/2022 11:45   US Abdomen Complete  Result Date: 05/31/2022 CLINICAL DATA:  Fatty liver EXAM: ULTRASOUND ABDOMEN ULTRASOUND HEPATIC ELASTOGRAPHY TECHNIQUE: Sonography of the upper abdomen was performed. In addition, ultrasound elastography evaluation of the liver was performed. A region of interest was placed within the right lobe of the liver. Following application of a compressive sonographic pulse, tissue compressibility was assessed. Multiple assessments were performed at the selected site. Median tissue compressibility was determined. Previously, hepatic stiffness was assessed by shear wave velocity. Based on recently published Society of Radiologists in Ultrasound consensus article, reporting is now recommended to be performed in the SI units of pressure (kiloPascals) representing hepatic stiffness/elasticity. The obtained result is compared to the published reference standards. (cACLD = compensated Advanced Chronic Liver Disease) COMPARISON:  02/03/2021 FINDINGS: ULTRASOUND ABDOMEN Gallbladder: No gallstones or wall thickening visualized. No sonographic Murphy sign noted by sonographer. Common bile duct: Diameter: Normal caliber, 4 mm Liver: Increased echotexture compatible with fatty infiltration. No focal  abnormality or biliary ductal dilatation. Portal vein is patent on color Doppler imaging with normal direction of blood flow towards the liver. IVC: No abnormality visualized. Pancreas: Visualized portion unremarkable. Spleen: Size and appearance within normal limits. Right Kidney: Length: 10.2 cm. Echogenicity within normal limits. No mass or hydronephrosis visualized. 8 mm upper pole cyst. Left Kidney: Length: 9.0 cm. Echogenicity within normal limits. No mass or hydronephrosis visualized. 1.4 cm lower pole cyst. Abdominal aorta: No aneurysm visualized. Other findings: None. ULTRASOUND HEPATIC ELASTOGRAPHY Device: Siemens Helix VTQ Patient position: Supine Transducer DAX Number of measurements: 15 Hepatic segment:  8 Median kPa: 1.9 IQR: 0.7 IQR/Median kPa ratio: 0.37 Data quality: IQR/Median kPa ratio of 0.3 or greater indicates reduced accuracy Diagnostic category:  < or = 5 kPa: high probability of being normal The use of hepatic elastography is applicable to patients with viral hepatitis and non-alcoholic fatty liver disease. At this time, there is insufficient data for the referenced cut-off values and use in other causes of liver disease, including alcoholic liver disease. Patients, however, may be assessed by elastography and serve as their own reference standard/baseline. In patients with non-alcoholic liver disease, the values suggesting compensated advanced chronic liver disease (cACLD) may be lower, and patients may need additional testing with elasticity results of 7-9 kPa. Please note that abnormal hepatic elasticity and shear wave velocities may also be identified in clinical settings other than with hepatic fibrosis, such as: acute hepatitis, elevated right heart and central venous pressures including use of beta blockers, veno-occlusive disease (Budd-Chiari), infiltrative processes such as mastocytosis/amyloidosis/infiltrative tumor/lymphoma, extrahepatic cholestasis, with hyperemia in the  post-prandial state, and with liver transplantation. Correlation with patient history, laboratory data, and clinical condition recommended. Diagnostic Categories: < or =5 kPa: high probability of being normal < or =9 kPa: in the absence of other known clinical signs, rules out cACLD >9 kPa and ?13 kPa: suggestive of cACLD, but needs further testing >13 kPa: highly suggestive of cACLD > or =17 kPa: highly suggestive of cACLD with an increased probability  of clinically significant portal hypertension IMPRESSION: ULTRASOUND ABDOMEN: Fatty infiltration of the liver. ULTRASOUND HEPATIC ELASTOGRAPHY: Median kPa:  1.9 Diagnostic category:  < or = 5 kPa: high probability of being normal Electronically Signed   By: Charlett Nose M.D.   On: 05/31/2022 11:45       Assessment & Plan:  Encounter for screening mammogram for malignant neoplasm of breast -     3D Screening Mammogram, Left and Right; Future  Need for influenza vaccination -     Flu vaccine trivalent PF, 6mos and older(Flulaval,Afluria,Fluarix,Fluzone)  Abnormal liver function tests Assessment & Plan: Has been worked up by GI.  S/p liver biopsy.  Discussed diet and exercise.  Weight loss.  Follow liver function tests.    Anemia, unspecified type Assessment & Plan: EGD and 01/2019 - results (per overview). Colonoscopy 09/2021 - f/u 5 years. Follow cbc.    Chronic kidney disease (CKD), stage IV (severe) [facility]) Assessment & Plan: 01/18/22 - Dr Arvin Collard - transplant listed as inactive.  (Activation when GFR consistently <15).  Has f/u 09/20/22. GFR 23.  Stable.  Continue f/u with nephrology.  Continue to avoid antiinflammatory medication.     Fatty liver Assessment & Plan: Diet, exercise and weight loss.  Follow liver function tests.     Gastroesophageal reflux disease, unspecified whether esophagitis present Assessment & Plan: Acid reflux controlled.  Omeprazole.    Hyperglycemia Assessment & Plan: Nephrology - was on ozempic - improved  CV, NAFLD and weight loss endpoints. Off now.  Not covered. Low carb diet and exercise.  Follow met b and A1c.    Primary hypertension Assessment & Plan: Blood pressure as outlined.   Continue metoprolol.  Follow metabolic panel.    Hypertriglyceridemia Assessment & Plan: Low carb diet and exercise.  Follow lipid panel and liver function tests.    Metabolic syndrome Assessment & Plan: Low carb diet and weight loss.  Follow.       Dale Grady, MD

## 2023-01-06 ENCOUNTER — Encounter: Payer: Self-pay | Admitting: Internal Medicine

## 2023-01-06 NOTE — Assessment & Plan Note (Signed)
Low carb diet and exercise.  Follow lipid panel and liver function tests.

## 2023-01-06 NOTE — Assessment & Plan Note (Signed)
Blood pressure as outlined.   Continue metoprolol.  Follow metabolic panel.

## 2023-01-06 NOTE — Assessment & Plan Note (Signed)
Has been worked up by GI.  S/p liver biopsy.  Discussed diet and exercise.  Weight loss.  Follow liver function tests.   

## 2023-01-06 NOTE — Assessment & Plan Note (Signed)
Nephrology - was on ozempic - improved CV, NAFLD and weight loss endpoints. Off now.  Not covered. Low carb diet and exercise.  Follow met b and A1c.

## 2023-01-06 NOTE — Assessment & Plan Note (Signed)
Acid reflux controlled.  Omeprazole.

## 2023-01-06 NOTE — Assessment & Plan Note (Signed)
01/18/22 - Dr Arvin Collard - transplant listed as inactive.  (Activation when GFR consistently <15).  Has f/u 09/20/22. GFR 23.  Stable.  Continue f/u with nephrology.  Continue to avoid antiinflammatory medication.

## 2023-01-06 NOTE — Assessment & Plan Note (Signed)
Diet, exercise and weight loss. Follow liver function tests.   

## 2023-01-06 NOTE — Assessment & Plan Note (Signed)
Low carb diet and weight loss.  Follow.  

## 2023-01-06 NOTE — Assessment & Plan Note (Signed)
EGD and 01/2019 - results (per overview). Colonoscopy 09/2021 - f/u 5 years. Follow cbc.

## 2023-01-20 ENCOUNTER — Other Ambulatory Visit: Payer: Self-pay | Admitting: Internal Medicine

## 2023-03-21 ENCOUNTER — Ambulatory Visit
Admission: RE | Admit: 2023-03-21 | Discharge: 2023-03-21 | Disposition: A | Discharge status: Home / Self Care | Payer: BC Managed Care – PPO | Source: Ambulatory Visit | Attending: Internal Medicine | Admitting: Internal Medicine

## 2023-03-21 DIAGNOSIS — Z1231 Encounter for screening mammogram for malignant neoplasm of breast: Secondary | ICD-10-CM | POA: Diagnosis present

## 2023-04-18 ENCOUNTER — Telehealth: Payer: Self-pay | Admitting: Internal Medicine

## 2023-04-18 DIAGNOSIS — R739 Hyperglycemia, unspecified: Secondary | ICD-10-CM

## 2023-04-18 DIAGNOSIS — E781 Pure hyperglyceridemia: Secondary | ICD-10-CM

## 2023-04-18 NOTE — Telephone Encounter (Signed)
 Patient need lab orders.

## 2023-04-19 NOTE — Addendum Note (Signed)
 Addended by: Rita Ohara D on: 04/19/2023 07:13 AM   Modules accepted: Orders

## 2023-04-19 NOTE — Telephone Encounter (Signed)
 Labs ordered.

## 2023-05-02 ENCOUNTER — Other Ambulatory Visit (INDEPENDENT_AMBULATORY_CARE_PROVIDER_SITE_OTHER): Payer: 59

## 2023-05-02 DIAGNOSIS — E781 Pure hyperglyceridemia: Secondary | ICD-10-CM

## 2023-05-02 DIAGNOSIS — R739 Hyperglycemia, unspecified: Secondary | ICD-10-CM | POA: Diagnosis not present

## 2023-05-02 LAB — LIPID PANEL
Cholesterol: 154 mg/dL (ref 0–200)
HDL: 45.5 mg/dL (ref >39.00)
LDL Cholesterol: 29 mg/dL (ref 0–99)
NonHDL: 108.3
Total CHOL/HDL Ratio: 3
Triglycerides: 395 mg/dL — ABNORMAL HIGH (ref 0.0–149.0)
VLDL: 79 mg/dL — ABNORMAL HIGH (ref 0.0–40.0)

## 2023-05-02 LAB — HEPATIC FUNCTION PANEL
ALT: 44 U/L — ABNORMAL HIGH (ref 0–35)
AST: 37 U/L (ref 0–37)
Albumin: 4.3 g/dL (ref 3.5–5.2)
Alkaline Phosphatase: 140 U/L — ABNORMAL HIGH (ref 39–117)
Bilirubin, Direct: 0.1 mg/dL (ref 0.0–0.3)
Total Bilirubin: 0.6 mg/dL (ref 0.2–1.2)
Total Protein: 7.4 g/dL (ref 6.0–8.3)

## 2023-05-02 LAB — BASIC METABOLIC PANEL
BUN: 53 mg/dL — ABNORMAL HIGH (ref 6–23)
CO2: 24 meq/L (ref 19–32)
Calcium: 9.6 mg/dL (ref 8.4–10.5)
Chloride: 105 meq/L (ref 96–112)
Creatinine, Ser: 3.1 mg/dL — ABNORMAL HIGH (ref 0.40–1.20)
GFR: 15.34 mL/min — ABNORMAL LOW (ref >60.00)
Glucose, Bld: 133 mg/dL — ABNORMAL HIGH (ref 70–99)
Potassium: 3.7 meq/L (ref 3.5–5.1)
Sodium: 140 meq/L (ref 135–145)

## 2023-05-02 LAB — HEMOGLOBIN A1C: Hgb A1c MFr Bld: 6.5 % (ref 4.6–6.5)

## 2023-05-04 ENCOUNTER — Encounter: Payer: Self-pay | Admitting: Internal Medicine

## 2023-05-04 ENCOUNTER — Ambulatory Visit: Payer: 59 | Admitting: Internal Medicine

## 2023-05-04 VITALS — BP 130/78 | HR 69 | Temp 98.8°F | Ht 61.0 in | Wt 188.0 lb

## 2023-05-04 DIAGNOSIS — E1165 Type 2 diabetes mellitus with hyperglycemia: Secondary | ICD-10-CM | POA: Diagnosis not present

## 2023-05-04 DIAGNOSIS — I10 Essential (primary) hypertension: Secondary | ICD-10-CM

## 2023-05-04 DIAGNOSIS — K760 Fatty (change of) liver, not elsewhere classified: Secondary | ICD-10-CM

## 2023-05-04 DIAGNOSIS — Z7985 Long-term (current) use of injectable non-insulin antidiabetic drugs: Secondary | ICD-10-CM

## 2023-05-04 DIAGNOSIS — E8881 Metabolic syndrome: Secondary | ICD-10-CM | POA: Diagnosis not present

## 2023-05-04 DIAGNOSIS — E781 Pure hyperglyceridemia: Secondary | ICD-10-CM

## 2023-05-04 DIAGNOSIS — R7989 Other specified abnormal findings of blood chemistry: Secondary | ICD-10-CM

## 2023-05-04 DIAGNOSIS — N184 Chronic kidney disease, stage 4 (severe): Secondary | ICD-10-CM

## 2023-05-04 DIAGNOSIS — D649 Anemia, unspecified: Secondary | ICD-10-CM

## 2023-05-04 DIAGNOSIS — K219 Gastro-esophageal reflux disease without esophagitis: Secondary | ICD-10-CM

## 2023-05-04 MED ORDER — SEMAGLUTIDE(0.25 OR 0.5MG/DOS) 2 MG/3ML ~~LOC~~ SOPN: 0.2500 mg | PEN_INJECTOR | SUBCUTANEOUS | 2 refills | Status: DC

## 2023-05-04 NOTE — Progress Notes (Signed)
Subjective:    Patient ID: Angel Collins, female    DOB: 03-07-1961, 63 y.o.   MRN: [id number]  Patient here for  Chief Complaint  Patient presents with   Medical Management of Chronic Issues    HPI Here for a scheduled follow up - f/u regarding hypertension, hypercholesterolemia and blood sugar. Saw GI 11/22/22 - f/u fatty liver. Stable. Due f/u colonoscopy 2028. Reports she is doing relatively well.  Handling stress.  No chest pain or sob reported. No abdominal pain or bowel change reported. Discussed recent labs. Decreased GFR - decreased more. Has f/u with nephrology next week. Off GLP 1 agonist. Insurance apparently stopped covering. Discussed restarting.  Reports tolerated GLP 1 agonist. Discussed diet and exercise.    Past Medical History:  Diagnosis Date   Abnormal liver function    fatty liver   Chronic kidney disease    medullary cystic kidney disease   Gout    Hypercholesterolemia    Hyperglycemia    Hypertension    Past Surgical History:  Procedure Laterality Date   CESAREAN SECTION  1983   ovarian cyst removed  1979   TUBAL LIGATION  1987   Family History  Problem Relation Age of Onset   Lung cancer Mother        died age 78   Liver cancer Mother    Liver cancer Father    Hypertension Brother    Hypercholesterolemia Brother    Gout Brother    Breast cancer Neg Hx    Social History   Socioeconomic History   Marital status: Married    Spouse name: Not on file   Number of children: 3   Years of education: Not on file   Highest education level: 12th grade  Occupational History   Not on file  Tobacco Use   Smoking status: Never   Smokeless tobacco: Never  Substance and Sexual Activity   Alcohol use: Yes    Alcohol/week: 0.0 standard drinks of alcohol    Comment: occasional   Drug use: No   Sexual activity: Not on file  Other Topics Concern   Not on file  Social History Narrative   Not on file   Social Drivers of Health   Financial Resource  Strain: Low Risk  (05/02/2023)   Overall Financial Resource Strain (CARDIA)    Difficulty of Paying Living Expenses: Not hard at all  Food Insecurity: No Food Insecurity (05/02/2023)   Hunger Vital Sign    Worried About Running Out of Food in the Last Year: Never true    Ran Out of Food in the Last Year: Never true  Transportation Needs: No Transportation Needs (05/02/2023)   PRAPARE - Administrator, Civil Service (Medical): No    Lack of Transportation (Non-Medical): No  Physical Activity: Insufficiently Active (05/02/2023)   Exercise Vital Sign    Days of Exercise per Week: 3 days    Minutes of Exercise per Session: 30 min  Stress: No Stress Concern Present (05/02/2023)   [employer] of Occupational Health - Occupational Stress Questionnaire    Feeling of Stress : Not at all  Social Connections: Moderately Integrated (05/02/2023)   Social Connection and Isolation Panel [NHANES]    Frequency of Communication with Friends and Family: More than three times a week    Frequency of Social Gatherings with Friends and Family: More than three times a week    Attends Religious Services: 1 to 4 times per year  Active Member of Clubs or Organizations: No    Attends Banker Meetings: Not on file    Marital Status: Married     Review of Systems  Constitutional:  Negative for appetite change.       Weight is up.   HENT:  Negative for congestion and sinus pressure.   Respiratory:  Negative for cough, chest tightness and shortness of breath.   Cardiovascular:  Negative for chest pain, palpitations and leg swelling.  Gastrointestinal:  Negative for abdominal pain, diarrhea, nausea and vomiting.  Genitourinary:  Negative for difficulty urinating and dysuria.  Musculoskeletal:  Negative for joint swelling and myalgias.  Skin:  Negative for color change and rash.  Neurological:  Negative for dizziness and headaches.  Psychiatric/Behavioral:  Negative for agitation and  dysphoric mood.        Objective:     BP 130/78   Pulse 69   Temp 98.8 F (37.1 C) (Oral)   Ht 5\' 1"  (1.549 m)   Wt 188 lb (85.3 kg)   SpO2 99%   BMI 35.52 kg/m  Wt Readings from Last 3 Encounters:  05/04/23 188 lb (85.3 kg)  01/02/23 179 lb 8 oz (81.4 kg)  09/01/22 171 lb (77.6 kg)    Physical Exam Vitals reviewed.  Constitutional:      General: She is not in acute distress.    Appearance: Normal appearance.  HENT:     Head: Normocephalic and atraumatic.     Right Ear: External ear normal.     Left Ear: External ear normal.     Mouth/Throat:     Pharynx: No oropharyngeal exudate or posterior oropharyngeal erythema.  Eyes:     General: No scleral icterus.       Right eye: No discharge.        Left eye: No discharge.     Conjunctiva/sclera: Conjunctivae normal.  Neck:     Thyroid: No thyromegaly.  Cardiovascular:     Rate and Rhythm: Normal rate and regular rhythm.  Pulmonary:     Effort: No respiratory distress.     Breath sounds: Normal breath sounds. No wheezing.  Abdominal:     General: Bowel sounds are normal.     Palpations: Abdomen is soft.     Tenderness: There is no abdominal tenderness.  Musculoskeletal:        General: No swelling or tenderness.     Cervical back: Neck supple. No tenderness.  Lymphadenopathy:     Cervical: No cervical adenopathy.  Skin:    Findings: No erythema or rash.  Neurological:     Mental Status: She is alert.  Psychiatric:        Mood and Affect: Mood normal.        Behavior: Behavior normal.         Outpatient Encounter Medications as of 05/04/2023  Medication Sig   aspirin 81 MG tablet Take 81 mg by mouth daily.   famotidine (PEPCID) 20 MG tablet Take 20 mg by mouth 2 (two) times daily.    febuxostat (ULORIC) 40 MG tablet TAKE 1 TABLET BY MOUTH EVERY DAY   furosemide (LASIX) 40 MG tablet Take 40 mg by mouth daily.   metoprolol tartrate (LOPRESSOR) 50 MG tablet TAKE 1 TABLET BY MOUTH TWICE A DAY    rosuvastatin (CRESTOR) 5 MG tablet TAKE 1 TABLET BY MOUTH EVERY DAY   Semaglutide,0.25 or 0.5MG /DOS, 2 MG/3ML SOPN Inject 0.25 mg into the skin once a week.   vitamin  E 400 UNIT capsule Take 400 Units by mouth 2 (two) times daily.   No facility-administered encounter medications on file as of 05/04/2023.     Lab Results  Component Value Date   WBC 6.8 12/28/2022   HGB 11.9 (L) 12/28/2022   HCT 36.1 12/28/2022   PLT 177.0 12/28/2022   GLUCOSE 133 (H) 05/02/2023   CHOL 154 05/02/2023   TRIG 395.0 (H) 05/02/2023   HDL 45.50 05/02/2023   LDLDIRECT 53.0 08/24/2022   LDLCALC 29 05/02/2023   ALT 44 (H) 05/02/2023   AST 37 05/02/2023   NA 140 05/02/2023   K 3.7 05/02/2023   CL 105 05/02/2023   CREATININE 3.10 (H) 05/02/2023   BUN 53 (H) 05/02/2023   CO2 24 05/02/2023   TSH 3.30 08/24/2022   HGBA1C 6.5 05/02/2023    MM 3D SCREENING MAMMOGRAM BILATERAL BREAST Result Date: 03/23/2023 CLINICAL DATA:  Screening. EXAM: DIGITAL SCREENING BILATERAL MAMMOGRAM WITH TOMOSYNTHESIS AND CAD TECHNIQUE: Bilateral screening digital craniocaudal and mediolateral oblique mammograms were obtained. Bilateral screening digital breast tomosynthesis was performed. The images were evaluated with computer-aided detection. COMPARISON:  Previous exam(s). ACR Breast Density Category c: The breasts are heterogeneously dense, which may obscure small masses. FINDINGS: There are no findings suspicious for malignancy. IMPRESSION: No mammographic evidence of malignancy. A result letter of this screening mammogram will be mailed directly to the patient. RECOMMENDATION: Screening mammogram in one year. (Code:SM-B-01Y) BI-RADS CATEGORY  1: Negative. Electronically Signed   By: Edwin Cap M.D.   On: 03/23/2023 14:10       Assessment & Plan:  Type 2 diabetes mellitus with hyperglycemia, without long-term current use of insulin (HCC) Assessment & Plan: Recent A1c 6.5. discussed diabetes. Discussed low carb diet and  exercise.  Discussed restarting GLP 1 agonist.  Ozempic .25mg  q week.  Follow sugars.    Metabolic syndrome Assessment & Plan: Low carb diet and weight loss.  Treat sugars as outlined.    Hypertriglyceridemia Assessment & Plan: Low carb diet and exercise.  Follow lipid panel and liver function tests.    Primary hypertension Assessment & Plan: Blood pressure as outlined.   Continue metoprolol.  Follow metabolic panel.    Gastroesophageal reflux disease, unspecified whether esophagitis present Assessment & Plan: Acid reflux controlled.  Omeprazole.    Fatty liver Assessment & Plan: Discussed diet, exercise and weight loss.  Follow liver function tests.  Recent liver panel, slightly elevated ALT and alkaline phos. Follow liver function tests.    Chronic kidney disease (CKD), stage IV (severe) [facility]) Assessment & Plan: 01/18/22 - Dr Arvin Collard - transplant listed as inactive.  (Activation when GFR consistently <15).  GFR 15 - recent check. Continue f/u with nephrology.  Has appt scheduled next week. Continue to avoid antiinflammatory medication.     Anemia, unspecified type Assessment & Plan: EGD and 01/2019 - results (per overview). Colonoscopy 09/2021 - f/u 5 years. Follow cbc.    Abnormal liver function tests Assessment & Plan: Has been worked up by GI.  S/p liver biopsy.  Discussed diet and exercise.  Weight loss.  Follow liver function tests.   Orders: -     Hepatic function panel; Future  Other orders -     Semaglutide(0.25 or 0.5MG /DOS); Inject 0.25 mg into the skin once a week.  Dispense: 3 mL; Refill: 2     Dale [place], MD

## 2023-05-07 ENCOUNTER — Encounter: Payer: Self-pay | Admitting: Internal Medicine

## 2023-05-07 NOTE — Assessment & Plan Note (Signed)
Low carb diet and weight loss.  Treat sugars as outlined.

## 2023-05-07 NOTE — Assessment & Plan Note (Signed)
Low carb diet and exercise.  Follow lipid panel and liver function tests.

## 2023-05-07 NOTE — Assessment & Plan Note (Signed)
EGD and 01/2019 - results (per overview). Colonoscopy 09/2021 - f/u 5 years. Follow cbc.

## 2023-05-07 NOTE — Assessment & Plan Note (Signed)
01/18/22 - Dr Arvin Collard - transplant listed as inactive.  (Activation when GFR consistently <15).  GFR 15 - recent check. Continue f/u with nephrology.  Has appt scheduled next week. Continue to avoid antiinflammatory medication.

## 2023-05-07 NOTE — Assessment & Plan Note (Signed)
Acid reflux controlled.  Omeprazole.

## 2023-05-07 NOTE — Assessment & Plan Note (Signed)
Recent A1c 6.5. discussed diabetes. Discussed low carb diet and exercise.  Discussed restarting GLP 1 agonist.  Ozempic .25mg  q week.  Follow sugars.

## 2023-05-07 NOTE — Assessment & Plan Note (Signed)
Has been worked up by GI.  S/p liver biopsy.  Discussed diet and exercise.  Weight loss.  Follow liver function tests.

## 2023-05-07 NOTE — Assessment & Plan Note (Signed)
Discussed diet, exercise and weight loss.  Follow liver function tests.  Recent liver panel, slightly elevated ALT and alkaline phos. Follow liver function tests.

## 2023-05-07 NOTE — Assessment & Plan Note (Signed)
Blood pressure as outlined.   Continue metoprolol.  Follow metabolic panel.

## 2023-05-08 ENCOUNTER — Other Ambulatory Visit (HOSPITAL_COMMUNITY): Payer: Self-pay

## 2023-05-08 ENCOUNTER — Telehealth: Payer: Self-pay

## 2023-05-08 NOTE — Telephone Encounter (Signed)
Pharmacy Patient Advocate Encounter  Received notification from [facility] that Prior Authorization for Ozempic (0.25 or 0.5 MG/DOSE) 2MG /3ML pen-injectors has been APPROVED from 05/08/2023 to 05/07/2026. Ran test claim, Copay is $30. This test claim was processed through [facility]- copay amounts may vary at other pharmacies due to pharmacy/plan contracts, or as the patient moves through the different stages of their insurance plan.   PA #/Case ID/Reference #: [id number]

## 2023-05-08 NOTE — Telephone Encounter (Signed)
Pharmacy Patient Advocate Encounter   Received notification from  [facility] Portal that prior authorization for Ozempic (0.25 or 0.5 MG/DOSE) 2MG /3ML pen-injectors is required/requested.   Insurance verification completed.   The patient is insured through [facility] .   Per test claim: PA required; PA submitted to above mentioned insurance via CoverMyMeds Key/confirmation #/EOC  [facility] Status is pending

## 2023-05-30 ENCOUNTER — Other Ambulatory Visit (INDEPENDENT_AMBULATORY_CARE_PROVIDER_SITE_OTHER): Payer: 59

## 2023-05-30 DIAGNOSIS — R7989 Other specified abnormal findings of blood chemistry: Secondary | ICD-10-CM | POA: Diagnosis not present

## 2023-05-30 LAB — HEPATIC FUNCTION PANEL
ALT: 40 U/L — ABNORMAL HIGH (ref 0–35)
AST: 33 U/L (ref 0–37)
Albumin: 4.2 g/dL (ref 3.5–5.2)
Alkaline Phosphatase: 147 U/L — ABNORMAL HIGH (ref 39–117)
Bilirubin, Direct: 0.1 mg/dL (ref 0.0–0.3)
Total Bilirubin: 0.6 mg/dL (ref 0.2–1.2)
Total Protein: 7.6 g/dL (ref 6.0–8.3)

## 2023-06-18 ENCOUNTER — Other Ambulatory Visit: Payer: Self-pay | Admitting: Internal Medicine

## 2023-07-12 ENCOUNTER — Telehealth: Payer: Self-pay

## 2023-07-12 NOTE — Transitions of Care (Post Inpatient/ED Visit) (Signed)
 07/12/2023  Name: Angel Collins MRN: [id number] DOB: [birth year]  Today's TOC FU Call Status: Today's TOC FU Call Status:: Successful TOC FU Call Completed TOC FU Call Complete Date: 07/12/23 Patient's Name and Date of Birth confirmed.  Transition Care Management Follow-up Telephone Call Date of Discharge: 07/11/23 Discharge Facility: Other ([facility] Facility) Name of Other ([facility]) Discharge Facility: [facility] Type of Discharge: Inpatient Admission Primary Inpatient Discharge Diagnosis:: Kidney Transplant How have you been since you were released from the hospital?: Better Any questions or concerns?: Yes Patient Questions/Concerns:: I don't have the prescription for two meds Patient Questions/Concerns Addressed: Other: (The patient is going to contact her kidney transplant team)  Items Reviewed: Did you receive and understand the discharge instructions provided?: Yes Medications obtained,verified, and reconciled?: Yes (Medications Reviewed) Any new allergies since your discharge?: No Dietary orders reviewed?: NA Do you have support at home?: Yes People in Home: spouse Name of Support/Comfort Primary Source: Angel Collins  Medications Reviewed Today: Medications Reviewed Today     Reviewed by Redge Gainer, RN (Case Manager) on 07/12/23 at 1140  Med List Status: <None>   Medication Order Taking? Sig Documenting Provider Last Dose Status Informant  acetaminophen (TYLENOL) 325 MG tablet [id number] Yes Take 650 mg by mouth every 6 (six) hours as needed. [provider]  Active   aspirin 81 MG chewable tablet [id number] Yes Chew by mouth. [provider]  Consider Medication Status and Discontinue (Duplicate)   aspirin 81 MG tablet [id number]  Take 81 mg by mouth daily. [provider]  Active   atovaquone [facility]) 750 MG/5ML suspension [id number] Yes Take 750 mg by mouth daily with breakfast. [provider]  Active   ciprofloxacin  (CIPRO) 250 MG tablet [id number] Yes Take 250 mg by mouth 2 (two) times daily. For 7 days [provider]  Active   famotidine (PEPCID) 20 MG tablet [id number]  Take 20 mg by mouth 2 (two) times daily.  [provider]  Active   febuxostat (ULORIC) 40 MG tablet [id number]  TAKE 1 TABLET BY MOUTH EVERY DAY Worthy Rancher B, FNP  Active   furosemide (LASIX) 40 MG tablet [id number]  Take 40 mg by mouth daily. [provider]  Active            Med Note Feliz Beam, Arie Sabina   Thu May 30, 2019  1:20 PM) 20 mg  gabapentin (NEURONTIN) 100 MG capsule [id number] Yes Take 100 mg by mouth at bedtime. [provider]  Active   Insulin Aspart FlexPen (NOVOLOG) 100 UNIT/ML [id number] Yes Inject into the skin. [provider]  Active   methocarbamol (ROBAXIN) 750 MG tablet [id number] Yes Take by mouth. [provider]  Active   metoprolol tartrate (LOPRESSOR) 50 MG tablet [id number]  TAKE 1 TABLET BY MOUTH TWICE A DAY Scott, Charlene, MD  Active   mycophenolate (CELLCEPT) 250 MG capsule [id number] Yes Take by mouth. [provider]  Active   polyethylene glycol powder (GLYCOLAX/MIRALAX) 17 GM/SCOOP powder [id number] Yes Take by mouth. [provider]  Active   rosuvastatin (CRESTOR) 5 MG tablet [id number]  TAKE 1 TABLET BY MOUTH EVERY DAY Dale Kimball, MD  Active   Semaglutide,0.25 or 0.5MG /DOS, 2 MG/3ML SOPN [id number]  Inject 0.25 mg into the skin once a week. Dale Wapello, MD  Active   Specialty Vitamins Products (MG PLUS PROTEIN) 133 MG TABS [id number] Yes Take by mouth. [provider]  Active   tacrolimus (PROGRAF) 1 MG capsule  [id number] Yes Take by mouth. [provider]  Active   traMADol (ULTRAM) 50 MG tablet [id number] Yes Take by mouth. [provider]  Active   valGANciclovir (VALCYTE) 450 MG tablet [id number] Yes Take by mouth. [provider]  Active   vitamin E 400 UNIT capsule [id number]  Take 400 Units  by mouth 2 (two) times daily. [provider]  Active             Home Care and Equipment/Supplies: Were Home Health Services Ordered?: NA Any new equipment or medical supplies ordered?: NA  Functional Questionnaire: Do you need assistance with bathing/showering or dressing?: No Do you need assistance with meal preparation?: No Do you need assistance with eating?: No Do you have difficulty maintaining continence: No Do you need assistance with getting out of bed/getting out of a chair/moving?: No Do you have difficulty managing or taking your medications?: No  Follow up appointments reviewed: PCP Follow-up appointment confirmed?: Yes Date of PCP follow-up appointment?: 09/01/23 Follow-up Provider: Dale [place] Specialist [facility] Follow-up appointment confirmed?: Yes Date of Specialist follow-up appointment?: 07/18/23 Follow-Up Specialty Provider:: Suella Broad Do you understand care options if your condition(s) worsen?: Yes-patient verbalized understanding  SDOH Interventions Today    Flowsheet Row Most Recent Value  SDOH Interventions   Food Insecurity Interventions Intervention Not Indicated  Housing Interventions Intervention Not Indicated  Transportation Interventions Intervention Not Indicated  Utilities Interventions Intervention Not Indicated      Interventions Today    Flowsheet Row Most Recent Value  Chronic Disease   Chronic disease during today's visit Chronic Kidney Disease/End Stage Renal Disease (ESRD)  [Kidney Transplant]  General Interventions   General Interventions Discussed/Reviewed General Interventions Discussed, General Interventions Reviewed, Doctor Visits  Doctor Visits Discussed/Reviewed Doctor Visits Reviewed, Doctor Visits Discussed  Exercise Interventions   Exercise Discussed/Reviewed Physical Activity  Physical Activity Discussed/Reviewed Physical Activity Reviewed  Education Interventions   Education Provided Provided  Education  Provided Verbal Education On When to see the doctor, Medication, Exercise, Insurance Plans  Nutrition Interventions   Nutrition Discussed/Reviewed Fluid intake, Nutrition Reviewed  Pharmacy Interventions   Pharmacy Dicussed/Reviewed Medications and their functions        Goals Addressed             This Visit's Progress    TOC Care Plan       Current Barriers:  Chronic Disease Management support and education needs related to ESRD   RNCM Clinical Goal(s):  Patient will work with the Care Management team over the next 30 days to address Transition of Care Barriers: Medication access Medication Management Diet/Nutrition/Food Resources Support at home Provider appointments verbalize understanding of plan for management of ESRD and Kidney Transplant as evidenced by No hospital re-admission in the next 30 days  through collaboration with RN Care manager, provider, and care team.   Interventions: Evaluation of current treatment plan related to  self management and patient's adherence to plan as established by provider   Surgery (07/08/23):  (Status: New goal.) Short Term Goal Evaluation of current treatment plan related to Kidney Transplant surgery assessed patient/caregiver understanding of surgical procedure   reviewed post-operative instructions with patient/caregiver reviewed medications with patient and addressed questions reviewed scheduled provider appointments with patient   Patient Goals/Self-Care Activities: Participate in Transition of Care Program/Attend [facility] scheduled calls Notify RN Care Manager of [facility] call rescheduling needs Take all medications as prescribed Attend all scheduled provider appointments Call pharmacy for medication refills 3-7 days in advance of running  out of medications Perform all self care activities independently  Perform IADL's (shopping, preparing meals, housekeeping, managing finances) independently Call provider office for new  concerns or questions   Follow Up Plan:  Telephone follow up appointment with care management team member scheduled for:  Wednesday April 9th at 10am          The patient is status post kidney transplant at [facility]. She is at home feeling well and does not have home health. She has a little pain but not much. Reviewed medications. She is going to call her transplant team because she has questions regarding two of her medications. She has an appointment with [facility] May 23rd and she does not want to move that up for a hospital follow up. She lives at home with her spouse and is independent. She agreed to the 30 day [facility] Outreach program   Routine follow-up and on-going assessment evaluation and education of disease processes, and recommended interventions for both chronic and acute medical conditions, will occur during each weekly visit during [facility] 30-day Program Outreach calls along with ongoing review of symptoms, medication reviews and reconciliation. Any updates, inconsistencies, discrepancies or acute care concerns will be addressed on the Care Plan and routed to the correct Practitioner if indicated.    The patient has been provided with contact information for the care management team and has been advised to call with any health-related questions or concerns. The patient verbalized understanding with current POC. The patient is directed to their insurance card regarding availability of benefits coverage.   Deidre Ala, BSN, RN [place]  VBCI - [employer] [facility] RN Care Manager [phone]

## 2023-07-19 ENCOUNTER — Other Ambulatory Visit: Payer: Self-pay

## 2023-07-19 NOTE — Patient Outreach (Signed)
 Transition of Care week 2  Visit Note  07/19/2023  Name: Angel Collins MRN: [id number]          DOB: [birth year]  Situation: Patient enrolled in [facility] 30-day program. Visit completed with Rema Fendt by telephone.   Background: Post Kidney Transplant    Past Medical History:  Diagnosis Date   Abnormal liver function    fatty liver   Chronic kidney disease    medullary cystic kidney disease   Gout    Hypercholesterolemia    Hyperglycemia    Hypertension     Assessment: TOC Outreach completed to the patient. She has had her follow up appointment with the Transplant team and she has some labs that are causing some concerns and she needs to have them drawn three times a week until further notice. She states she has no pain, no rejection symptoms. Her appetite is good and she is sleeping well. Her strength is improving. She likes the weekly check in calls and is agreeable to continuing with the [facility] Program.  Patient Reported Symptoms:  Cognitive @FLOW (205576::1))@  Neurological No symptoms reported    HEENT No symptoms reported    Cardiovascular No symptoms reported    Respiratory No symptoms reported    Endocrine No symptoms reported    Gastrointestinal No symptoms reported    Genitourinary No symptoms reported    Integumentary No symptoms reported    Musculoskeletal No symptoms reported    Psychosocial No symptoms reported     There were no vitals filed for this visit.  Medications Reviewed Today     Reviewed by Redge Gainer, RN (Case Manager) on 07/19/23 at 1001  Med List Status: <None>   Medication Order Taking? Sig Documenting Provider Last Dose Status Informant  acetaminophen (TYLENOL) 325 MG tablet [id number]  Take 650 mg by mouth every 6 (six) hours as needed. [provider]  Active   aspirin 81 MG chewable tablet [id number]  Chew by mouth. [provider]  Active   aspirin 81 MG tablet [id number] No Take 81 mg by mouth daily.  [provider] Taking Active   atovaquone (MEPRON) 750 MG/5ML suspension [id number]  Take 750 mg by mouth daily with breakfast. [provider]  Active   famotidine (PEPCID) 20 MG tablet [id number] No Take 20 mg by mouth 2 (two) times daily.  [provider] Taking Active   febuxostat (ULORIC) 40 MG tablet [id number] No TAKE 1 TABLET BY MOUTH EVERY DAY Worthy Rancher B, FNP Taking Active   furosemide (LASIX) 40 MG tablet [id number] No Take 40 mg by mouth daily. [provider] Taking Active            Med Note Feliz Beam, Arie Sabina   Thu May 30, 2019  1:20 PM) 20 mg  gabapentin (NEURONTIN) 100 MG capsule [id number]  Take 100 mg by mouth at bedtime. [provider]  Active   Insulin Aspart FlexPen (NOVOLOG) 100 UNIT/ML [id number]  Inject into the skin. [provider]  Active   methocarbamol (ROBAXIN) 750 MG tablet [id number]  Take by mouth. [provider]  Active   metoprolol tartrate (LOPRESSOR) 50 MG tablet [id number] No TAKE 1 TABLET BY MOUTH TWICE A DAY Scott, Charlene, MD Taking Active   mycophenolate (CELLCEPT) 250 MG capsule [id number]  Take by mouth. [provider]  Active   polyethylene glycol powder (GLYCOLAX/MIRALAX) 17 GM/SCOOP powder [id number]  Take by mouth. [provider]  Active   rosuvastatin (CRESTOR) 5 MG tablet  [id number]  TAKE 1 TABLET BY MOUTH EVERY DAY Dale [place], MD  Active   Semaglutide,0.25 or 0.5MG /DOS, 2 MG/3ML SOPN [id number]  Inject 0.25 mg into the skin once a week. Dale [place], MD  Active   Specialty Vitamins Products (MG PLUS PROTEIN) 133 MG TABS [id number]  Take by mouth. [provider]  Active   tacrolimus (PROGRAF) 1 MG capsule [id number]  Take by mouth. [provider]  Active   valGANciclovir (VALCYTE) 450 MG tablet [id number]  Take by mouth. [provider]  Active   vitamin E 400 UNIT capsule [id number] No Take 400 Units by mouth 2 (two) times daily.  [provider] Taking Active             Recommendation:   PCP Follow-up Lab requests: Three times a week Increase activity daily to build strength Watch for signs and symptoms of transplant rejection such as fever, nausea, vomiting, changes in level of care   Follow Up Plan:   Telephone follow-up in 1 week  Wednesday April 16th, at 9:30am  Routine follow-up and on-going assessment evaluation and education of disease processes, and recommended interventions for both chronic and acute medical conditions, will occur during each weekly visit during [facility] 30-day Program Outreach calls along with ongoing review of symptoms, medication reviews and reconciliation. Any updates, inconsistencies, discrepancies or acute care concerns will be addressed on the Care Plan and routed to the correct Practitioner if indicated.    The patient has been provided with contact information for the care management team and has been advised to call with any health-related questions or concerns. The patient verbalized understanding with current POC. The patient is directed to their insurance card regarding availability of benefits coverage.  Deidre Ala, BSN, RN [place]  VBCI - [employer] [facility] RN Care Manager [phone]

## 2023-07-19 NOTE — Patient Instructions (Signed)
 Visit Information  Thank you for taking time to visit with me today. Please don't hesitate to contact me if I can be of assistance to you before our next scheduled telephone appointment.  Our next appointment is by telephone on Wednesday April 16th at 9:30am  Following is a copy of your care plan:   Goals Addressed             This Visit's Progress    TOC Care Plan   On track    Current Barriers: (reviewed 07/19/23) Chronic Disease Management support and education needs related to ESRD   RNCM Clinical Goal(s): (reviewed 07/19/23) Patient will work with the Care Management team over the next 30 days to address Transition of Care Barriers: Medication access Medication Management Diet/Nutrition/Food Resources Support at home Provider appointments verbalize understanding of plan for management of ESRD and Kidney Transplant as evidenced by No hospital re-admission in the next 30 days  through collaboration with RN Care manager, provider, and care team.   Interventions: (reviewed 07/19/23) Evaluation of current treatment plan related to  self management and patient's adherence to plan as established by provider   (Status: Goal on track:  Yes.) Short Term Goal (reviewed 07/19/23) Evaluation of current treatment plan related to Kidney Transplant surgery assessed patient/caregiver understanding of surgical procedure   reviewed post-operative instructions with patient/caregiver reviewed medications with patient and addressed questions reviewed scheduled provider appointments with patient  Assess for signs of rejection of the kidney Labs three times a week as directed by the provider  Patient Goals/Self-Care Activities: (reviewed 07/19/23) Participate in Transition of Care Program/Attend [facility] scheduled calls Notify RN Care Manager of TOC call rescheduling needs Take all medications as prescribed Attend all scheduled provider appointments Call pharmacy for medication refills 3-7 days in advance  of running out of medications Perform all self care activities independently  Perform IADL's (shopping, preparing meals, housekeeping, managing finances) independently Call provider office for new concerns or questions   Follow Up Plan:  Telephone follow up appointment with care management team member scheduled for:  Wednesday April 9th at 10am         Patient verbalizes understanding of instructions and care plan provided today and agrees to view in [place]. Active MyChart status and patient understanding of how to access instructions and care plan via MyChart confirmed with patient.     The patient has been provided with contact information for the care management team and has been advised to call with any health related questions or concerns.   Please call the care guide team at [phone] if you need to cancel or reschedule your appointment.   Please call the Suicide and Crisis Lifeline: 988 call the [place] National Suicide Prevention Lifeline: (856) 265-6944 or TTY: 802-080-3726 TTY (403)340-6713) to talk to a trained counselor if you are experiencing a Mental Health or Behavioral Health Crisis or need someone to talk to.  Deidre Ala, BSN, RN [place]  VBCI - [employer] [facility] RN Care Manager [phone]

## 2023-07-26 ENCOUNTER — Other Ambulatory Visit: Payer: Self-pay

## 2023-07-26 NOTE — Patient Instructions (Signed)
 Visit Information  Thank you for taking time to visit with me today. Please don't hesitate to contact me if I can be of assistance to you before our next scheduled telephone appointment.  Our next appointment is by telephone on April 23rd at 10am  Following is a copy of your care plan:   Goals Addressed             This Visit's Progress    VBCI TOC Care Plan   On track    Current Barriers: (reviewed 07/26/23) Chronic Disease Management support and education needs related to ESRD   RNCM Clinical Goal(s): (reviewed 07/26/23) Patient will work with the Care Management team over the next 30 days to address Transition of Care Barriers: Medication access Medication Management Diet/Nutrition/Food Resources Support at home Provider appointments verbalize understanding of plan for management of ESRD and Kidney Transplant as evidenced by No hospital re-admission in the next 30 days  through collaboration with RN Care manager, provider, and care team.   Interventions: (reviewed 07/26/23) Evaluation of current treatment plan related to  self management and patient's adherence to plan as established by provider   (Status: Goal on track:  Yes.) Short Term Goal (reviewed 07/26/23) Evaluation of current treatment plan related to Kidney Transplant surgery assessed patient/caregiver understanding of surgical procedure   reviewed post-operative instructions with patient/caregiver reviewed medications with patient and addressed questions reviewed scheduled provider appointments with patient  Assess for signs of rejection of the kidney Labs three times a week as directed by the provider  Patient Goals/Self-Care Activities: (reviewed 07/26/23) Participate in Transition of Care Program/Attend [facility] scheduled calls Notify RN Care Manager of TOC call rescheduling needs Take all medications as prescribed Attend all scheduled provider appointments Call pharmacy for medication refills 3-7 days in advance of  running out of medications Perform all self care activities independently  Perform IADL's (shopping, preparing meals, housekeeping, managing finances) independently Call provider office for new concerns or questions   Follow Up Plan:  Telephone follow up appointment with care management team member scheduled for:  Wednesday April 23rd at 10am         Patient verbalizes understanding of instructions and care plan provided today and agrees to view in [place]. Active MyChart status and patient understanding of how to access instructions and care plan via MyChart confirmed with patient.     The patient has been provided with contact information for the care management team and has been advised to call with any health related questions or concerns.   Please call the care guide team at [phone] if you need to cancel or reschedule your appointment.   Please call the Suicide and Crisis Lifeline: 988 call the [place]  National Suicide Prevention Lifeline: (626)185-8057 or TTY: (509)312-1226 TTY (413) 407-3489) to talk to a trained counselor if you are experiencing a Mental Health or Behavioral Health Crisis or need someone to talk to.  Gareld June, BSN, RN [place]  VBCI - [employer] [facility] RN Care Manager [phone]

## 2023-07-26 NOTE — Transitions of Care (Post Inpatient/ED Visit) (Signed)
 Transition of Care week 3  Visit Note  07/26/2023  Name: Angel Collins MRN: [id number]          DOB: November 28, 1960  Situation: Patient enrolled in [facility] 30-day program. Visit completed with Cathleen Coach by telephone.   Background:     Past Medical History:  Diagnosis Date   Abnormal liver function    fatty liver   Chronic kidney disease    medullary cystic kidney disease   Gout    Hypercholesterolemia    Hyperglycemia    Hypertension     Assessment: TOC Outreach completed. The patient states she continues to recover from her kidney transplant and is not having any complications. She had an ultrasound completed yesterday and there is a small amount of fluid but the MD is not concerned. She continues with three time a week lab draws. She states her stamina is improving. No concerns this week.   Patient Reported Symptoms: Cognitive Cognitive Status: Alert and oriented to person, place, and time      Neurological    No Symptoms reported  HEENT HEENT Symptoms Reported: No symptoms reported      Cardiovascular Cardiovascular Symptoms Reported: No symptoms reported    Respiratory Respiratory Symptoms Reported: No symptoms reported    Endocrine Patient reports the following symptoms related to hypoglycemia or hyperglycemia : No symptoms reported    Gastrointestinal Gastrointestinal Symptoms Reported: Other Additional Gastrointestinal Details: Loose stools but not diarrhea Gastrointestinal Conditions: Other Other Gastrointestinal Conditions: frequent stools at times Gastrointestinal Self-Management Outcome: 4 (good) Gastrointestinal Comment: The patient states she has good days and other days when she has frequent bowel movements Nutrition Risk Screen (CP): No indicators present  Genitourinary    No symptoms reported  Integumentary Integumentary Symptoms Reported: No symptoms reported    Musculoskeletal Musculoskelatal Symptoms Reviewed: No symptoms reported   Falls in the  past year?: No Number of falls in past year: 1 or less Was there an injury with Fall?: No Fall Risk Category Calculator: 0 Patient Fall Risk Level: Low Fall Risk Patient at Risk for Falls Due to: No Fall Risks  Psychosocial Psychosocial Symptoms Reported: No symptoms reported         There were no vitals filed for this visit.  Medications Reviewed Today     Reviewed by Claudene Crystal, RN (Case Manager) on 07/26/23 at [phone]  Med List Status: <None>   Medication Order Taking? Sig Documenting Provider Last Dose Status Informant  acetaminophen (TYLENOL) 325 MG tablet [id number]  Take 650 mg by mouth every 6 (six) hours as needed. [provider]  Active   aspirin 81 MG chewable tablet [id number]  Chew by mouth. [provider]  Active   aspirin 81 MG tablet [id number] No Take 81 mg by mouth daily. [provider] Taking Active   atovaquone (MEPRON) 750 MG/5ML suspension [id number]  Take 750 mg by mouth daily with breakfast. [provider]  Active   famotidine (PEPCID) 20 MG tablet [id number] No Take 20 mg by mouth 2 (two) times daily.  [provider] Taking Active   febuxostat (ULORIC) 40 MG tablet [id number] No TAKE 1 TABLET BY MOUTH EVERY DAY Webb, Padonda B, FNP Taking Active   furosemide (LASIX) 40 MG tablet [id number] No Take 40 mg by mouth daily. [provider] Taking Active            Med Note Sherolyn Dixon, Anabel Balloon   Thu May 30, 2019  1:20 PM) 20 mg  gabapentin (NEURONTIN)  100 MG capsule [id number]  Take 100 mg by mouth at bedtime. [provider]  Active   Insulin Aspart FlexPen (NOVOLOG) 100 UNIT/ML [id number]  Inject into the skin. [provider]  Active   methocarbamol (ROBAXIN) 750 MG tablet [id number]  Take by mouth. [provider]  Active   metoprolol tartrate (LOPRESSOR) 50 MG tablet [id number] No TAKE 1 TABLET BY MOUTH TWICE A DAY Scott, Charlene, MD Taking Active   mycophenolate (CELLCEPT) 250 MG  capsule [id number]  Take by mouth. [provider]  Active   rosuvastatin (CRESTOR) 5 MG tablet [id number]  TAKE 1 TABLET BY MOUTH EVERY DAY Dellar [place]enton, MD  Active   Semaglutide,0.25 or 0.5MG /DOS, 2 MG/3ML SOPN [id number]  Inject 0.25 mg into the skin once a week. Dellar Fenton, MD  Active   Specialty Vitamins Products (MG PLUS PROTEIN) 133 MG TABS [id number]  Take by mouth. [provider]  Active   tacrolimus (PROGRAF) 1 MG capsule [id number]  Take by mouth. [provider]  Active   valGANciclovir (VALCYTE) 450 MG tablet [id number]  Take by mouth. [provider]  Active   vitamin E 400 UNIT capsule [id number] No Take 400 Units by mouth 2 (two) times daily. [provider] Taking Active             Recommendation:   Continue with labs three times a week Increase mobility as tolerated  Follow up with the transplant team as directed  Follow Up Plan:   Telephone follow-up in 1 week  Gareld June, BSN, RN [place]  VBCI - [facility] RN Care Manager [phone]

## 2023-08-02 ENCOUNTER — Other Ambulatory Visit: Payer: Self-pay

## 2023-08-02 NOTE — Patient Instructions (Signed)
 Visit Information  Thank you for taking time to visit with me today. Please don't hesitate to contact me if I can be of assistance to you before our next scheduled telephone appointment.  Following is a copy of your care plan:   Goals Addressed             This Visit's Progress    COMPLETED: VBCI TOC Care Plan       Current Barriers: (reviewed 07/26/23) Chronic Disease Management support and education needs related to ESRD   RNCM Clinical Goal(s): (reviewed 07/26/23) Patient will work with the Care Management team over the next 30 days to address Transition of Care Barriers: Medication access Medication Management Diet/Nutrition/Food Resources Support at home Provider appointments verbalize understanding of plan for management of ESRD and Kidney Transplant as evidenced by No hospital re-admission in the next 30 days  through collaboration with RN Care manager, provider, and care team.   Interventions: (reviewed 07/26/23) Evaluation of current treatment plan related to  self management and patient's adherence to plan as established by provider   (Status: Goal on track:  Yes.) Short Term Goal (reviewed 07/26/23) Evaluation of current treatment plan related to Kidney Transplant surgery assessed patient/caregiver understanding of surgical procedure   reviewed post-operative instructions with patient/caregiver reviewed medications with patient and addressed questions reviewed scheduled provider appointments with patient  Assess for signs of rejection of the kidney Labs three times a week as directed by the provider  Patient Goals/Self-Care Activities: (reviewed 07/26/23) Participate in Transition of Care Program/Attend [facility] scheduled calls Notify RN Care Manager of TOC call rescheduling needs Take all medications as prescribed Attend all scheduled provider appointments Call pharmacy for medication refills 3-7 days in advance of running out of medications Perform all self care  activities independently  Perform IADL's (shopping, preparing meals, housekeeping, managing finances) independently Call provider office for new concerns or questions   Follow Up Plan:  Telephone follow up appointment with care management team member scheduled for:  Wednesday April 23rd at 10am         The patient has been provided with contact information for the care management team and has been advised to call with any health related questions or concerns.   Please call the care guide team at [phone] if you need to cancel or reschedule your appointment.   Please call the Suicide and Crisis Lifeline: 988 call the [place]  National Suicide Prevention Lifeline: 617 660 4783 or TTY: (703)776-2319 TTY 239 464 0529) to talk to a trained counselor if you are experiencing a Mental Health or Behavioral Health Crisis or need someone to talk to.  Gareld June, BSN, RN [place]  VBCI - [employer] [facility] RN Care Manager [phone]

## 2023-08-02 NOTE — Transitions of Care (Post Inpatient/ED Visit) (Signed)
  Transition of Care week 4  Visit Note  08/02/2023  Name: Angel Collins MRN: [id number]          DOB: 1961-01-11  Situation: Patient enrolled in [facility] 30-day program. Visit completed with Cathleen Coach by telephone.   Background:     Past Medical History:  Diagnosis Date   Abnormal liver function    fatty liver   Chronic kidney disease    medullary cystic kidney disease   Gout    Hypercholesterolemia    Hyperglycemia    Hypertension     Assessment: TOC Outreach scheduled to the patient today. The patient was at an appointment and stated she could not complete the interview. She also stated that she has recovered very well from her transplant and felt that she didn't require additional support from the [facility] Program and asked not be un-enrolled.  Patient Reported Symptoms: Cognitive Cognitive Status: Alert and oriented to person, place, and time Cognitive/Intellectual Conditions Management [RPT]: Not Assessed      Neurological Neurological Review of Symptoms: Not assessed    HEENT HEENT Symptoms Reported: Not assessed      Cardiovascular Cardiovascular Symptoms Reported: Not assessed    Respiratory Respiratory Symptoms Reported: Not assesed    Endocrine Patient reports the following symptoms related to hypoglycemia or hyperglycemia : Not assessed    Gastrointestinal Gastrointestinal Symptoms Reported: Not assessed      Genitourinary Genitourinary Symptoms Reported: Not assessed    Integumentary Integumentary Symptoms Reported: Not assessed    Musculoskeletal Musculoskelatal Symptoms Reviewed: Not assessed        Psychosocial Psychosocial Symptoms Reported: Not assessed         There were no vitals filed for this visit.  Medications Reviewed Today   Medications were not reviewed in this encounter     Recommendation:   Specialty provider follow-up Kidney transplant Team  Follow Up Plan:   Closing From:  Transitions of Care Program [facility], BSN,  RN [place]  VBCI - [facility] RN Care Manager [phone]

## 2023-08-14 ENCOUNTER — Encounter: Payer: Self-pay | Admitting: Internal Medicine

## 2023-08-14 ENCOUNTER — Telehealth: Payer: Self-pay

## 2023-08-14 DIAGNOSIS — Z940 Kidney transplant status: Secondary | ICD-10-CM | POA: Insufficient documentation

## 2023-08-14 NOTE — Transitions of Care (Post Inpatient/ED Visit) (Signed)
   08/14/2023  Name: Angel Collins MRN: [id number] DOB: January 01, 1961  Today's TOC FU Call Status: Today's TOC FU Call Status:: Unsuccessful Call (1st Attempt) Unsuccessful Call (1st Attempt) Date: 08/14/23  Attempted to reach the patient regarding the most recent Inpatient/ED visit.  Follow Up Plan: Additional outreach attempts will be made to reach the patient to complete the Transitions of Care (Post Inpatient/ED visit) call.   Gareld June, BSN, RN [place]  VBCI - [employer] [facility] RN Care Manager [phone]

## 2023-08-15 ENCOUNTER — Telehealth: Payer: Self-pay

## 2023-08-15 NOTE — Transitions of Care (Post Inpatient/ED Visit) (Signed)
 08/15/2023  Name: Angel Collins MRN: [id number] DOB: 1960/10/31  Today's TOC FU Call Status: Today's TOC FU Call Status:: Successful TOC FU Call Completed TOC FU Call Complete Date: 08/15/23 Patient's Name and Date of Birth confirmed.  Transition Care Management Follow-up Telephone Call Date of Discharge: 08/12/23 Discharge Facility: Other [facility]) Name of Other (Non[facility]) Discharge Facility: [facility] Type of Discharge: Inpatient Admission Primary Inpatient Discharge Diagnosis:: AKI; Low sodium How have you been since you were released from the hospital?: Better Any questions or concerns?: No  Items Reviewed: Did you receive and understand the discharge instructions provided?: Yes Medications obtained,verified, and reconciled?: Yes (Medications Reviewed) Any new allergies since your discharge?: No Dietary orders reviewed?: NA Do you have support at home?: Yes People in Home [RPT]: spouse Name of Support/Comfort Primary Source: Jerzy Spitzley  Medications Reviewed Today: Medications Reviewed Today     Reviewed by Claudene Crystal, RN (Case Manager) on 08/15/23 at 1151  Med List Status: <None>   Medication Order Taking? Sig Documenting Provider Last Dose Status Informant  acetaminophen (TYLENOL) 325 MG tablet [id number] No Take 650 mg by mouth every 6 (six) hours as needed.  Patient not taking: Reported on 08/15/2023   [provider] Unknown Active   aspirin 81 MG chewable tablet [id number] No Chew by mouth.  Patient not taking: Reported on 08/15/2023   [provider] Not Taking Active   aspirin 81 MG tablet [id number]  Take 81 mg by mouth daily. [provider]  Consider Medication Status and Discontinue (No longer needed (for PRN medications))   atovaquone (MEPRON) 750 MG/5ML suspension [id number]  Take 750 mg by mouth daily with breakfast. [provider]  Active   carvedilol (COREG) 6.25 MG tablet [id number] Yes Take 6.25 mg by  mouth 2 (two) times daily with a meal. [provider]  Active   famotidine (PEPCID) 20 MG tablet [id number]  Take 20 mg by mouth 2 (two) times daily.  [provider]  Active   febuxostat  (ULORIC ) 40 MG tablet [id number]  TAKE 1 TABLET BY MOUTH EVERY DAY Webb, Padonda B, FNP  Active   furosemide (LASIX) 40 MG tablet [id number]  Take 20 mg by mouth daily as needed. [provider]  Active            Med Note Sherolyn Dixon, Anabel Balloon   Thu May 30, 2019  1:20 PM) 20 mg  gabapentin (NEURONTIN) 100 MG capsule [id number] No Take 100 mg by mouth at bedtime.  Patient not taking: Reported on 08/15/2023   [provider] Not Taking Active   Insulin Aspart FlexPen (NOVOLOG) 100 UNIT/ML [id number] No Inject into the skin.  Patient not taking: Reported on 08/15/2023   [provider] Not Taking Active   magnesium  oxide-pyridoxine (BEELITH) 362-20 MG TABS tablet [id number] Yes Take 1 tablet by mouth 2 (two) times daily. [provider]  Active   methocarbamol (ROBAXIN) 750 MG tablet [id number] Yes Take by mouth. [provider] Taking Active   metoprolol  tartrate (LOPRESSOR ) 50 MG tablet [id number] No TAKE 1 TABLET BY MOUTH TWICE A DAY  Patient not taking: Reported on 08/15/2023   Dellar Fenton, MD Not Taking Active   mycophenolate (CELLCEPT) 250 MG capsule [id number]  Take by mouth. Monday and Thursday [provider]  Active   rosuvastatin  (CRESTOR ) 5 MG tablet [id number] No TAKE 1 TABLET BY MOUTH EVERY DAY  Patient not taking: Reported on 08/15/2023   Dellar Fenton, MD Not Taking Active  Semaglutide ,0.25 or 0.5MG /DOS, 2 MG/3ML SOPN [id number] No Inject 0.25 mg into the skin once a week.  Patient not taking: Reported on 08/15/2023   Dellar Fenton, MD Not Taking Active   Specialty Vitamins Products (MG PLUS PROTEIN) 133 MG TABS [id number] No Take by mouth.  Patient not taking: Reported on 08/15/2023   [provider] Not Taking Active    tacrolimus (PROGRAF) 1 MG capsule [id number] Yes Take 4 mg by mouth daily. [provider] Taking Active   valGANciclovir (VALCYTE) 450 MG tablet [id number] Yes Take by mouth. [provider] Taking Active   vitamin E 400 UNIT capsule [id number] No Take 400 Units by mouth 2 (two) times daily.  Patient not taking: Reported on 08/15/2023   [provider] Not Taking Active             Home Care and Equipment/Supplies: Were Home Health Services Ordered?: NA Any new equipment or medical supplies ordered?: NA  Functional Questionnaire: Do you need assistance with bathing/showering or dressing?: No Do you need assistance with meal preparation?: No Do you need assistance with eating?: No Do you have difficulty maintaining continence: No Do you need assistance with getting out of bed/getting out of a chair/moving?: No Do you have difficulty managing or taking your medications?: No  Follow up appointments reviewed: PCP Follow-up appointment confirmed?: Yes Date of PCP follow-up appointment?: 09/01/23 Follow-up Provider: Dellar Fenton Specialist [facility] Follow-up appointment confirmed?: No Reason Specialist Follow-Up Not Confirmed: Patient has Specialist Provider Number and will Call for Appointment Do you need transportation to your follow-up appointment?: No Do you understand care options if your condition(s) worsen?: Yes-patient verbalized understanding  SDOH Interventions Today    Flowsheet Row Most Recent Value  SDOH Interventions   Food Insecurity Interventions Intervention Not Indicated  Housing Interventions Intervention Not Indicated  Transportation Interventions Intervention Not Indicated  Utilities Interventions Intervention Not Indicated       Gareld June, BSN, RN [place]  VBCI - [facility] RN Care Manager [phone]

## 2023-09-01 ENCOUNTER — Ambulatory Visit: Payer: 59 | Admitting: Internal Medicine

## 2023-09-11 ENCOUNTER — Encounter: Payer: Self-pay | Admitting: Internal Medicine

## 2023-09-11 ENCOUNTER — Ambulatory Visit: Admitting: Internal Medicine

## 2023-09-11 ENCOUNTER — Ambulatory Visit: Payer: Self-pay | Admitting: Internal Medicine

## 2023-09-11 VITALS — BP 126/72 | HR 72 | Temp 98.0°F | Resp 16 | Ht 61.0 in | Wt 172.4 lb

## 2023-09-11 DIAGNOSIS — K219 Gastro-esophageal reflux disease without esophagitis: Secondary | ICD-10-CM

## 2023-09-11 DIAGNOSIS — I10 Essential (primary) hypertension: Secondary | ICD-10-CM

## 2023-09-11 DIAGNOSIS — R7989 Other specified abnormal findings of blood chemistry: Secondary | ICD-10-CM | POA: Diagnosis not present

## 2023-09-11 DIAGNOSIS — N184 Chronic kidney disease, stage 4 (severe): Secondary | ICD-10-CM

## 2023-09-11 DIAGNOSIS — E1165 Type 2 diabetes mellitus with hyperglycemia: Secondary | ICD-10-CM | POA: Diagnosis not present

## 2023-09-11 DIAGNOSIS — E781 Pure hyperglyceridemia: Secondary | ICD-10-CM

## 2023-09-11 DIAGNOSIS — M109 Gout, unspecified: Secondary | ICD-10-CM

## 2023-09-11 DIAGNOSIS — Z940 Kidney transplant status: Secondary | ICD-10-CM

## 2023-09-11 DIAGNOSIS — D649 Anemia, unspecified: Secondary | ICD-10-CM

## 2023-09-11 LAB — POCT GLYCOSYLATED HEMOGLOBIN (HGB A1C): Hemoglobin A1C: 6.1 % — AB (ref 4.0–5.6)

## 2023-09-11 LAB — HM DIABETES FOOT EXAM

## 2023-09-11 NOTE — Progress Notes (Signed)
 Subjective:    Patient ID: Angel Collins, female    DOB: Jan 27, 1961, 63 y.o.   MRN: [id number]  Patient here for  Chief Complaint  Patient presents with   Medical Management of Chronic Issues    HPI Here for a scheduled follow up - follow up regarding hypercholesterolemia, hyperglycemia and hypertension. 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 Saw Dr Mottl 08/29/23. Had issues with wound dehiscence requiring wound packing and abx. Had urinary retention with new hydronephrosis requiring foley catheter placement. Was removed and had good PVR.  (Tacrolimus and mycophenolate). GFR 22.  Scheduled for follow-up CT scan on Thursday of this week.  No significant pain now.  She does notice a little discomfort if she bends.  Incision is healing.  No chest pain or shortness of breath.  No nausea or vomiting.  Taste has changed some.  Eating and drinking okay.  Bowels doing well.  She is currently on Augmentin 500 mg.   Past Medical History:  Diagnosis Date   Abnormal liver function    fatty liver   Chronic kidney disease    medullary cystic kidney disease   Gout    Hypercholesterolemia    Hyperglycemia    Hypertension    Past Surgical History:  Procedure Laterality Date   CESAREAN SECTION  1983   ovarian cyst removed  1979   TUBAL LIGATION  1987   Family History  Problem Relation Age of Onset   Lung cancer Mother        died age 72   Liver cancer Mother    Liver cancer Father    Hypertension Brother    Hypercholesterolemia Brother    Gout Brother    Breast cancer Neg Hx    Social History   Socioeconomic History   Marital status: Married    Spouse name: Not on file   Number of children: 3   Years of education: Not on file   Highest education level: 12th grade  Occupational History   Not on file  Tobacco Use   Smoking status: Never   Smokeless tobacco: Never  Substance and Sexual Activity   Alcohol use: Yes    Alcohol/week: 0.0 standard drinks  of alcohol    Comment: occasional   Drug use: No   Sexual activity: Not on file  Other Topics Concern   Not on file  Social History Narrative   Not on file   Social Drivers of Health   Financial Resource Strain: Low Risk  (05/02/2023)   Overall Financial Resource Strain (CARDIA)    Difficulty of Paying Living Expenses: Not hard at all  Food Insecurity: No Food Insecurity (08/15/2023)   Hunger Vital Sign    Worried About Running Out of Food in the Last Year: Never true    Ran Out of Food in the Last Year: Never true  Transportation Needs: No Transportation Needs (08/15/2023)   PRAPARE - Administrator, Civil Service (Medical): No    Lack of Transportation (Non-Medical): No  Physical Activity: Insufficiently Active (05/02/2023)   Exercise Vital Sign    Days of Exercise per Week: 3 days    Minutes of Exercise per Session: 30 min  Stress: No Stress Concern Present (05/02/2023)   [employer] of Occupational Health - Occupational Stress Questionnaire    Feeling of Stress : Not at all  Social Connections: Moderately Integrated (05/02/2023)   Social Connection and Isolation Panel [NHANES]    Frequency  of Communication with Friends and Family: More than three times a week    Frequency of Social Gatherings with Friends and Family: More than three times a week    Attends Religious Services: 1 to 4 times per year    Active Member of [employer] or Organizations: No    Attends Engineer, structural: Not on file    Marital Status: Married     Review of Systems  Constitutional:  Negative for appetite change and unexpected weight change.  HENT:  Negative for congestion and sinus pressure.   Respiratory:  Negative for cough and chest tightness.        Breathing stable.   Cardiovascular:  Negative for chest pain, palpitations and leg swelling.  Gastrointestinal:  Negative for abdominal pain, nausea and vomiting.  Genitourinary:  Negative for difficulty urinating and dysuria.   Musculoskeletal:  Negative for joint swelling and myalgias.  Skin:  Negative for color change and rash.  Neurological:  Negative for dizziness and headaches.  Psychiatric/Behavioral:  Negative for agitation and dysphoric mood.        Objective:     BP 126/72   Pulse 72   Temp 98 F (36.7 C)   Resp 16   Ht 5\' 1"  (1.549 m)   Wt 172 lb 6.4 oz (78.2 kg)   SpO2 98%   BMI 32.57 kg/m  Wt Readings from Last 3 Encounters:  09/11/23 172 lb 6.4 oz (78.2 kg)  05/04/23 188 lb (85.3 kg)  01/02/23 179 lb 8 oz (81.4 kg)    Physical Exam Vitals reviewed.  Constitutional:      General: She is not in acute distress.    Appearance: Normal appearance.  HENT:     Head: Normocephalic and atraumatic.     Right Ear: External ear normal.     Left Ear: External ear normal.     Mouth/Throat:     Pharynx: No oropharyngeal exudate or posterior oropharyngeal erythema.  Eyes:     General: No scleral icterus.       Right eye: No discharge.        Left eye: No discharge.     Conjunctiva/sclera: Conjunctivae normal.  Neck:     Thyroid : No thyromegaly.  Cardiovascular:     Rate and Rhythm: Normal rate and regular rhythm.  Pulmonary:     Effort: No respiratory distress.     Breath sounds: Normal breath sounds. No wheezing.  Abdominal:     General: Bowel sounds are normal.     Palpations: Abdomen is soft.     Tenderness: There is no abdominal tenderness.  Musculoskeletal:        General: No swelling or tenderness.     Cervical back: Neck supple. No tenderness.  Lymphadenopathy:     Cervical: No cervical adenopathy.  Skin:    Findings: No erythema or rash.  Neurological:     Mental Status: She is alert.  Psychiatric:        Mood and Affect: Mood normal.        Behavior: Behavior normal.         Outpatient Encounter Medications as of 09/11/2023  Medication Sig   acetaminophen (TYLENOL) 325 MG tablet Take 650 mg by mouth every 6 (six) hours as needed. (Patient not taking: Reported on  08/15/2023)   atovaquone (MEPRON) 750 MG/5ML suspension Take 750 mg by mouth daily with breakfast.   carvedilol (COREG) 6.25 MG tablet Take 6.25 mg by mouth 2 (two) times daily with  a meal.   famotidine (PEPCID) 20 MG tablet Take 20 mg by mouth 2 (two) times daily.    febuxostat  (ULORIC ) 40 MG tablet TAKE 1 TABLET BY MOUTH EVERY DAY   furosemide (LASIX) 40 MG tablet Take 20 mg by mouth daily as needed.   magnesium  oxide-pyridoxine (BEELITH) 362-20 MG TABS tablet Take 1 tablet by mouth 2 (two) times daily.   mycophenolate (CELLCEPT) 250 MG capsule Take by mouth. Monday and Thursday   tacrolimus (PROGRAF) 1 MG capsule Take 4 mg by mouth daily.   valGANciclovir (VALCYTE) 450 MG tablet Take by mouth.   [DISCONTINUED] aspirin 81 MG chewable tablet Chew by mouth. (Patient not taking: Reported on 08/15/2023)   [DISCONTINUED] aspirin 81 MG tablet Take 81 mg by mouth daily.   [DISCONTINUED] gabapentin (NEURONTIN) 100 MG capsule Take 100 mg by mouth at bedtime. (Patient not taking: Reported on 08/15/2023)   [DISCONTINUED] Insulin Aspart FlexPen (NOVOLOG) 100 UNIT/ML Inject into the skin. (Patient not taking: Reported on 08/15/2023)   [DISCONTINUED] methocarbamol (ROBAXIN) 750 MG tablet Take by mouth.   [DISCONTINUED] metoprolol  tartrate (LOPRESSOR ) 50 MG tablet TAKE 1 TABLET BY MOUTH TWICE A DAY (Patient not taking: Reported on 08/15/2023)   [DISCONTINUED] rosuvastatin  (CRESTOR ) 5 MG tablet TAKE 1 TABLET BY MOUTH EVERY DAY (Patient not taking: Reported on 08/15/2023)   [DISCONTINUED] Semaglutide ,0.25 or 0.5MG /DOS, 2 MG/3ML SOPN Inject 0.25 mg into the skin once a week. (Patient not taking: Reported on 08/15/2023)   [DISCONTINUED] Specialty Vitamins Products (MG PLUS PROTEIN) 133 MG TABS Take by mouth. (Patient not taking: Reported on 08/15/2023)   [DISCONTINUED] vitamin E 400 UNIT capsule Take 400 Units by mouth 2 (two) times daily. (Patient not taking: Reported on 08/15/2023)   No facility-administered encounter  medications on file as of 09/11/2023.     Lab Results  Component Value Date   WBC 6.8 12/28/2022   HGB 11.9 (L) 12/28/2022   HCT 36.1 12/28/2022   PLT 177.0 12/28/2022   GLUCOSE 133 (H) 05/02/2023   CHOL 154 05/02/2023   TRIG 395.0 (H) 05/02/2023   HDL 45.50 05/02/2023   LDLDIRECT 53.0 08/24/2022   LDLCALC 29 05/02/2023   ALT 40 (H) 05/30/2023   AST 33 05/30/2023   NA 140 05/02/2023   K 3.7 05/02/2023   CL 105 05/02/2023   CREATININE 3.10 (H) 05/02/2023   BUN 53 (H) 05/02/2023   CO2 24 05/02/2023   TSH 3.30 08/24/2022   HGBA1C 6.1 (A) 09/11/2023    MM 3D SCREENING MAMMOGRAM BILATERAL BREAST Result Date: 03/23/2023 CLINICAL DATA:  Screening. EXAM: DIGITAL SCREENING BILATERAL MAMMOGRAM WITH TOMOSYNTHESIS AND CAD TECHNIQUE: Bilateral screening digital craniocaudal and mediolateral oblique mammograms were obtained. Bilateral screening digital breast tomosynthesis was performed. The images were evaluated with computer-aided detection. COMPARISON:  Previous exam(s). ACR Breast Density Category c: The breasts are heterogeneously dense, which may obscure small masses. FINDINGS: There are no findings suspicious for malignancy. IMPRESSION: No mammographic evidence of malignancy. A result letter of this screening mammogram will be mailed directly to the patient. RECOMMENDATION: Screening mammogram in one year. (Code:SM-B-01Y) BI-RADS CATEGORY  1: Negative. Electronically Signed   By: Alger Infield M.D.   On: 03/23/2023 14:10       Assessment & Plan:  Type 2 diabetes mellitus with hyperglycemia, without long-term current use of insulin (HCC) Assessment & Plan: On no medication. Diet and exercise. Recent A1c 6.1.   Orders: -     POCT glycosylated hemoglobin (Hb A1C)  Abnormal liver function tests  Assessment & Plan: Has been worked up by GI.  S/p liver biopsy.  Diet, exercise and weight loss. Follow liver function tests.    Anemia, unspecified type Assessment & Plan: EGD and  01/2019 - results (per overview). Colonoscopy 09/2021 - f/u 5 years. Follow [facility]bc.    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 Saw Dr Mottl 08/29/23. Had issues with wound dehiscence requiring wound packing and abx. Had urinary retention with new hydronephrosis requiring foley catheter placement. Was removed and had good PVR.  (Tacrolimus and mycophenolate). GFR 22.  Scheduled for follow-up CT scan on Thursday of this week.  No significant pain now.  She does notice a little discomfort if she bends.  Incision is healing.  Continue to f/u with renal transplant team. They are following labs.    Gastroesophageal reflux disease, unspecified whether esophagitis present Assessment & Plan: Taking pepcid. No upper symptoms reported.    Gout, unspecified cause, unspecified chronicity, unspecified site Assessment & Plan: Continues uloric . No recent flares reported.    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 Saw Dr Mottl 08/29/23. Had issues with wound dehiscence requiring wound packing and abx. Had urinary retention with new hydronephrosis requiring foley catheter placement. Was removed and had good PVR.  (Tacrolimus and mycophenolate). GFR 22.  Scheduled for follow-up CT scan on Thursday of this week.  No significant pain now.  She does notice a little discomfort if she bends.  Incision is healing.     Primary hypertension Assessment & Plan: Continue metoprolol . Blood pressure as outlined. No changes in medication. Follow pressures. Follow metabolic panel.    Hypertriglyceridemia Assessment & Plan: Low carb diet and exercise.  Follow lipid panel and liver function tests. Continues on crestor . Follow lipid panel.  Lab Results  Component Value Date   CHOL 154 05/02/2023   HDL 45.50 05/02/2023   LDLCALC 29 05/02/2023    LDLDIRECT 53.0 08/24/2022   TRIG 395.0 (H) 05/02/2023   CHOLHDL 3 05/02/2023         Dellar Fenton, MD

## 2023-09-17 ENCOUNTER — Encounter: Payer: Self-pay | Admitting: Internal Medicine

## 2023-09-17 NOTE — Assessment & Plan Note (Signed)
 EGD and 01/2019 - results (per overview). Colonoscopy 09/2021 - f/u 5 years. Follow [facility].

## 2023-09-17 NOTE — Assessment & Plan Note (Signed)
 On no medication. Diet and exercise. Recent A1c 6.1.

## 2023-09-17 NOTE — Assessment & Plan Note (Signed)
Continue metoprolol.  Blood pressure as outlined.  No changes in medication.  Follow pressures.  Follow metabolic panel.  

## 2023-09-17 NOTE — Assessment & Plan Note (Signed)
 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 Saw Dr Mottl 08/29/23. Had issues with wound dehiscence requiring wound packing and abx. Had urinary retention with new hydronephrosis requiring foley catheter placement. Was removed and had good PVR.  (Tacrolimus and mycophenolate). GFR 22.  Scheduled for follow-up CT scan on Thursday of this week.  No significant pain now.  She does notice a little discomfort if she bends.  Incision is healing.  Continue to f/u with renal transplant team. They are following labs.

## 2023-09-17 NOTE — Assessment & Plan Note (Signed)
Has been worked up by GI.  S/p liver biopsy.  Diet, exercise and weight loss.  Follow liver function tests.

## 2023-09-17 NOTE — Assessment & Plan Note (Signed)
 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 Saw Dr Mottl 08/29/23. Had issues with wound dehiscence requiring wound packing and abx. Had urinary retention with new hydronephrosis requiring foley catheter placement. Was removed and had good PVR.  (Tacrolimus and mycophenolate). GFR 22.  Scheduled for follow-up CT scan on Thursday of this week.  No significant pain now.  She does notice a little discomfort if she bends.  Incision is healing.

## 2023-09-17 NOTE — Assessment & Plan Note (Signed)
 Low carb diet and exercise.  Follow lipid panel and liver function tests. Continues on crestor . Follow lipid panel.  Lab Results  Component Value Date   CHOL 154 05/02/2023   HDL 45.50 05/02/2023   LDLCALC 29 05/02/2023   LDLDIRECT 53.0 08/24/2022   TRIG 395.0 (H) 05/02/2023   CHOLHDL 3 05/02/2023

## 2023-09-17 NOTE — Assessment & Plan Note (Signed)
 Continues uloric . No recent flares reported.

## 2023-09-17 NOTE — Assessment & Plan Note (Signed)
 Taking pepcid. No upper symptoms reported.

## 2024-01-12 ENCOUNTER — Ambulatory Visit: Admitting: Internal Medicine

## 2024-01-12 VITALS — BP 116/72 | HR 79 | Resp 16 | Ht 60.0 in | Wt 153.0 lb

## 2024-01-12 DIAGNOSIS — R7989 Other specified abnormal findings of blood chemistry: Secondary | ICD-10-CM

## 2024-01-12 DIAGNOSIS — Z23 Encounter for immunization: Secondary | ICD-10-CM | POA: Diagnosis not present

## 2024-01-12 DIAGNOSIS — N184 Chronic kidney disease, stage 4 (severe): Secondary | ICD-10-CM

## 2024-01-12 DIAGNOSIS — Z0000 Encounter for general adult medical examination without abnormal findings: Secondary | ICD-10-CM | POA: Diagnosis not present

## 2024-01-12 DIAGNOSIS — D649 Anemia, unspecified: Secondary | ICD-10-CM

## 2024-01-12 DIAGNOSIS — E8881 Metabolic syndrome: Secondary | ICD-10-CM | POA: Diagnosis not present

## 2024-01-12 DIAGNOSIS — K760 Fatty (change of) liver, not elsewhere classified: Secondary | ICD-10-CM

## 2024-01-12 DIAGNOSIS — Z7985 Long-term (current) use of injectable non-insulin antidiabetic drugs: Secondary | ICD-10-CM

## 2024-01-12 DIAGNOSIS — E781 Pure hyperglyceridemia: Secondary | ICD-10-CM

## 2024-01-12 DIAGNOSIS — Z940 Kidney transplant status: Secondary | ICD-10-CM

## 2024-01-12 DIAGNOSIS — E1165 Type 2 diabetes mellitus with hyperglycemia: Secondary | ICD-10-CM

## 2024-01-12 DIAGNOSIS — I10 Essential (primary) hypertension: Secondary | ICD-10-CM

## 2024-01-12 LAB — MICROALBUMIN, URINE

## 2024-01-12 NOTE — Progress Notes (Signed)
 Subjective:    Patient ID: Angel Collins, female    DOB: 12-Jan-1961, 63 y.o.   MRN: [id number]  Patient here for  Chief Complaint  Patient presents with   Annual Exam    HPI Here for a physical exam. 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 Saw Dr Mottl 08/29/23. Had issues with wound dehiscence requiring wound packing and abx. Had urinary retention with new hydronephrosis requiring foley catheter placement. Was removed and had good PVR. (Tacrolimus and mycophenolate). Also with perinephric fluid collection which was drained by IR, negative for urinoma. Nadir serum creatinine level was 1.5-1.7. 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 Held off on transitioning to belatacept due to mild rejection. Planning for repeat kidney biopsy 01/16/24. Overall she feels she is doing relatively well. Handling stress well. No chest pain or sob reported. No abdominal pain or bowel change reported. Nephrology following labs.    Past Medical History:  Diagnosis Date   Abnormal liver function    fatty liver   Chronic kidney disease    medullary cystic kidney disease   Gout    Hypercholesterolemia    Hyperglycemia    Hypertension    Past Surgical History:  Procedure Laterality Date   CESAREAN SECTION  1983   ovarian cyst removed  1979   TUBAL LIGATION  1987   Family History  Problem Relation Age of Onset   Lung cancer Mother        died age 11   Liver cancer Mother    Liver cancer Father    Hypertension Brother    Hypercholesterolemia Brother    Gout Brother    Breast cancer Neg Hx    Social History   Socioeconomic History   Marital status: Married    Spouse name: Not on file   Number of children: 3   Years of education: Not on file   Highest education level: 12th grade  Occupational History   Not on file  Tobacco Use   Smoking status: Never   Smokeless tobacco: Never   Substance and Sexual Activity   Alcohol use: Yes    Alcohol/week: 0.0 standard drinks of alcohol    Comment: occasional   Drug use: No   Sexual activity: Not on file  Other Topics Concern   Not on file  Social History Narrative   Not on file   Social Drivers of Health   Financial Resource Strain: Low Risk  (01/05/2024)   Overall Financial Resource Strain (CARDIA)    Difficulty of Paying Living Expenses: Not hard at all  Food Insecurity: No Food Insecurity (01/05/2024)   Hunger Vital Sign    Worried About Running Out of Food in the Last Year: Never true    Ran Out of Food in the Last Year: Never true  Transportation Needs: No Transportation Needs (01/05/2024)   PRAPARE - Administrator, Civil Service (Medical): No    Lack of Transportation (Non-Medical): No  Physical Activity: Insufficiently Active (01/05/2024)   Exercise Vital Sign    Days of Exercise per Week: 3 days    Minutes of Exercise per Session: 20 min  Stress: No Stress Concern Present (01/05/2024)   [employer] of Occupational Health - Occupational Stress Questionnaire    Feeling of Stress: Not at all  Social Connections: Moderately Integrated (01/05/2024)   Social Connection and Isolation Panel    Frequency of Communication with  Friends and Family: Three times a week    Frequency of Social Gatherings with Friends and Family: Three times a week    Attends Religious Services: 1 to 4 times per year    Active Member of Clubs or Organizations: No    Attends Engineer, structural: Not on file    Marital Status: Married     Review of Systems  Constitutional:  Negative for appetite change and unexpected weight change.  HENT:  Negative for congestion, sinus pressure and sore throat.   Eyes:  Negative for pain and visual disturbance.  Respiratory:  Negative for cough, chest tightness and shortness of breath.   Cardiovascular:  Negative for chest pain and palpitations.       No increased swelling.    Gastrointestinal:  Negative for abdominal pain, diarrhea, nausea and vomiting.  Genitourinary:  Negative for difficulty urinating and dysuria.  Musculoskeletal:  Negative for joint swelling and myalgias.  Skin:  Negative for color change and rash.  Neurological:  Negative for dizziness and headaches.  Hematological:  Negative for adenopathy. Does not bruise/bleed easily.  Psychiatric/Behavioral:  Negative for decreased concentration and dysphoric mood.        Objective:     BP 116/72   Pulse 79   Resp 16   Ht 5' (1.524 m)   Wt 153 lb (69.4 kg)   SpO2 98%   BMI 29.88 kg/m  Wt Readings from Last 3 Encounters:  01/12/24 153 lb (69.4 kg)  09/11/23 172 lb 6.4 oz (78.2 kg)  05/04/23 188 lb (85.3 kg)    Physical Exam Vitals reviewed.  Constitutional:      General: She is not in acute distress.    Appearance: Normal appearance. She is well-developed.  HENT:     Head: Normocephalic and atraumatic.     Right Ear: External ear normal.     Left Ear: External ear normal.     Mouth/Throat:     Pharynx: No oropharyngeal exudate or posterior oropharyngeal erythema.  Eyes:     General: No scleral icterus.       Right eye: No discharge.        Left eye: No discharge.     Conjunctiva/sclera: Conjunctivae normal.  Neck:     Thyroid : No thyromegaly.  Cardiovascular:     Rate and Rhythm: Normal rate and regular rhythm.  Pulmonary:     Effort: No tachypnea, accessory muscle usage or respiratory distress.     Breath sounds: Normal breath sounds. No decreased breath sounds or wheezing.  Chest:  Breasts:    Right: No inverted nipple, mass, nipple discharge or tenderness (no axillary adenopathy).     Left: No inverted nipple, mass, nipple discharge or tenderness (no axilarry adenopathy).  Abdominal:     General: Bowel sounds are normal.     Palpations: Abdomen is soft.     Tenderness: There is no abdominal tenderness.  Musculoskeletal:        General: No swelling or tenderness.      Cervical back: Neck supple.  Lymphadenopathy:     Cervical: No cervical adenopathy.  Skin:    Findings: No erythema or rash.  Neurological:     Mental Status: She is alert and oriented to person, place, and time.  Psychiatric:        Mood and Affect: Mood normal.        Behavior: Behavior normal.         Outpatient Encounter Medications as of 01/12/2024  Medication Sig   carvedilol (COREG) 6.25 MG tablet Take 6.25 mg by mouth 2 (two) times daily with a meal.   famotidine (PEPCID) 20 MG tablet Take 20 mg by mouth 2 (two) times daily.    febuxostat  (ULORIC ) 40 MG tablet TAKE 1 TABLET BY MOUTH EVERY DAY   mycophenolate (CELLCEPT) 250 MG capsule Take by mouth. Monday and Thursday   tacrolimus (PROGRAF) 1 MG capsule Take 4 mg by mouth daily.   [DISCONTINUED] acetaminophen (TYLENOL) 325 MG tablet Take 650 mg by mouth every 6 (six) hours as needed. (Patient not taking: Reported on 08/15/2023)   [DISCONTINUED] furosemide (LASIX) 40 MG tablet Take 20 mg by mouth daily as needed.   [DISCONTINUED] magnesium  oxide-pyridoxine (BEELITH) 362-20 MG TABS tablet Take 1 tablet by mouth 2 (two) times daily.   No facility-administered encounter medications on file as of 01/12/2024.     Lab Results  Component Value Date   WBC 6.8 12/28/2022   HGB 11.9 (L) 12/28/2022   HCT 36.1 12/28/2022   PLT 177.0 12/28/2022   GLUCOSE 133 (H) 05/02/2023   CHOL 154 05/02/2023   TRIG 395.0 (H) 05/02/2023   HDL 45.50 05/02/2023   LDLDIRECT 53.0 08/24/2022   LDLCALC 29 05/02/2023   ALT 40 (H) 05/30/2023   AST 33 05/30/2023   NA 140 05/02/2023   K 3.7 05/02/2023   CL 105 05/02/2023   CREATININE 3.10 (H) 05/02/2023   BUN 53 (H) 05/02/2023   CO2 24 05/02/2023   TSH 3.30 08/24/2022   HGBA1C 6.1 (A) 09/11/2023   MICROALBUR followed by nephrology 01/12/2024    MM 3D SCREENING MAMMOGRAM BILATERAL BREAST Result Date: 03/23/2023 CLINICAL DATA:  Screening. EXAM: DIGITAL SCREENING BILATERAL MAMMOGRAM WITH  TOMOSYNTHESIS AND CAD TECHNIQUE: Bilateral screening digital craniocaudal and mediolateral oblique mammograms were obtained. Bilateral screening digital breast tomosynthesis was performed. The images were evaluated with computer-aided detection. COMPARISON:  Previous exam(s). ACR Breast Density Category c: The breasts are heterogeneously dense, which may obscure small masses. FINDINGS: There are no findings suspicious for malignancy. IMPRESSION: No mammographic evidence of malignancy. A result letter of this screening mammogram will be mailed directly to the patient. RECOMMENDATION: Screening mammogram in one year. (Code:SM-B-01Y) BI-RADS CATEGORY  1: Negative. Electronically Signed   By: Delon Music M.D.   On: 03/23/2023 14:10       Assessment & Plan:  Routine general medical examination at a health care facility  Need for vaccination for H flu type B -     Flu vaccine trivalent PF, 6mos and older(Flulaval,Afluria,Fluarix,Fluzone)  Type 2 diabetes mellitus with hyperglycemia, without long-term current use of insulin (HCC) Assessment & Plan: Reports taking ozempic . Diet and exercise. Recent A1c  12/21/23 - 6.3.    Metabolic syndrome Assessment & Plan: Continue low carb diet and exercise. Follow.    Abnormal liver function tests Assessment & Plan: Has been worked up by GI.  S/p liver biopsy.  Diet, exercise and weight loss. Follow liver function tests.    Anemia, unspecified type Assessment & Plan: EGD and 01/2019 - results (per overview). Colonoscopy 09/2021 - f/u 5 years.CBC being followed by nephrology.  01/08/24 nephrology labs reviewed - 9.9.    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 Saw Dr Mottl 08/29/23. Had issues with wound dehiscence requiring wound packing and abx. Had urinary retention with new hydronephrosis requiring foley catheter placement. Was removed and had good PVR.   (  Tacrolimus and mycophenolate). Continue to f/u with renal transplant team. They are following labs. Planning for kidney biopsy 01/16/24.    Fatty liver Assessment & Plan: Continue diet and exercise. Follow liver panel.    Health care maintenance Assessment & Plan: Physical today 01/12/24.  PAP 04/28/22 - negative with negative HPV   Colonoscopy 09/2021. SABRA  Recommended f/u in 5 years.  Mammogram 03/21/23 - Birads I.     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 Saw Dr Mottl 08/29/23. Had issues with wound dehiscence requiring wound packing and abx. Had urinary retention with new hydronephrosis requiring foley catheter placement. Was removed and had good PVR.  (Tacrolimus and mycophenolate). Also with perinephric fluid collection which was drained by IR, negative for urinoma. Nadir serum creatinine level was 1.5-1.7. 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 Held off on transitioning to belatacept due to mild rejection. Planning for repeat kidney biopsy 01/16/24.    Primary hypertension Assessment & Plan: Continue metoprolol . Blood pressure as outlined. No changes in medication. Follow pressures. Nephrology following metabolic panel.    Hypertriglyceridemia Assessment & Plan: Low carb diet and exercise.  Follow lipid panel and liver function tests. Continues on crestor . Follow lipid panel.  Lab Results  Component Value Date   CHOL 154 05/02/2023   HDL 45.50 05/02/2023   LDLCALC 29 05/02/2023   LDLDIRECT 53.0 08/24/2022   TRIG 395.0 (H) 05/02/2023   CHOLHDL 3 05/02/2023         Allena Hamilton, MD

## 2024-01-14 ENCOUNTER — Encounter: Payer: Self-pay | Admitting: Internal Medicine

## 2024-01-14 NOTE — Assessment & Plan Note (Signed)
Has been worked up by GI.  S/p liver biopsy.  Diet, exercise and weight loss.  Follow liver function tests.

## 2024-01-14 NOTE — Assessment & Plan Note (Signed)
 Reports taking ozempic . Diet and exercise. Recent A1c  12/21/23 - 6.3.

## 2024-01-14 NOTE — Assessment & Plan Note (Signed)
 EGD and 01/2019 - results (per overview). Colonoscopy 09/2021 - f/u 5 years.CBC being followed by nephrology.  01/08/24 nephrology labs reviewed - 9.9.

## 2024-01-14 NOTE — Assessment & Plan Note (Signed)
Continue low carb diet and exercise.  Follow.  

## 2024-01-14 NOTE — Assessment & Plan Note (Signed)
 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 Saw Dr Mottl 08/29/23. Had issues with wound dehiscence requiring wound packing and abx. Had urinary retention with new hydronephrosis requiring foley catheter placement. Was removed and had good PVR.  (Tacrolimus and mycophenolate). Continue to f/u with renal transplant team. They are following labs. Planning for kidney biopsy 01/16/24.

## 2024-01-14 NOTE — Assessment & Plan Note (Signed)
 Low carb diet and exercise.  Follow lipid panel and liver function tests. Continues on crestor . Follow lipid panel.  Lab Results  Component Value Date   CHOL 154 05/02/2023   HDL 45.50 05/02/2023   LDLCALC 29 05/02/2023   LDLDIRECT 53.0 08/24/2022   TRIG 395.0 (H) 05/02/2023   CHOLHDL 3 05/02/2023

## 2024-01-14 NOTE — Assessment & Plan Note (Signed)
 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 Saw Dr Mottl 08/29/23. Had issues with wound dehiscence requiring wound packing and abx. Had urinary retention with new hydronephrosis requiring foley catheter placement. Was removed and had good PVR.  (Tacrolimus and mycophenolate). Also with perinephric fluid collection which was drained by IR, negative for urinoma. Nadir serum creatinine level was 1.5-1.7. 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 Held off on transitioning to belatacept due to mild rejection. Planning for repeat kidney biopsy 01/16/24.

## 2024-01-14 NOTE — Assessment & Plan Note (Signed)
 Continue metoprolol . Blood pressure as outlined. No changes in medication. Follow pressures. Nephrology following metabolic panel.

## 2024-01-14 NOTE — Assessment & Plan Note (Signed)
 Physical today 01/12/24.  PAP 04/28/22 - negative with negative HPV   Colonoscopy 09/2021. SABRA  Recommended f/u in 5 years.  Mammogram 03/21/23 - Birads I.

## 2024-01-14 NOTE — Assessment & Plan Note (Signed)
Continue diet and exercise. Follow liver panel.  

## 2024-03-22 ENCOUNTER — Other Ambulatory Visit: Payer: Self-pay | Admitting: Internal Medicine

## 2024-03-22 DIAGNOSIS — Z1231 Encounter for screening mammogram for malignant neoplasm of breast: Secondary | ICD-10-CM

## 2024-04-24 ENCOUNTER — Ambulatory Visit
Admission: RE | Admit: 2024-04-24 | Discharge: 2024-04-24 | Disposition: A | Discharge status: Home / Self Care | Source: Ambulatory Visit | Attending: Internal Medicine | Admitting: Internal Medicine

## 2024-04-24 DIAGNOSIS — Z1231 Encounter for screening mammogram for malignant neoplasm of breast: Secondary | ICD-10-CM | POA: Diagnosis present

## 2024-05-14 ENCOUNTER — Ambulatory Visit: Admitting: Internal Medicine

## 2024-05-14 ENCOUNTER — Encounter: Payer: Self-pay | Admitting: Internal Medicine

## 2024-05-14 VITALS — BP 120/70 | HR 77 | Temp 98.1°F | Ht 60.0 in | Wt 155.0 lb

## 2024-05-14 DIAGNOSIS — E8881 Metabolic syndrome: Secondary | ICD-10-CM

## 2024-05-14 DIAGNOSIS — Q615 Medullary cystic kidney: Secondary | ICD-10-CM

## 2024-05-14 DIAGNOSIS — E1165 Type 2 diabetes mellitus with hyperglycemia: Secondary | ICD-10-CM

## 2024-05-14 DIAGNOSIS — E781 Pure hyperglyceridemia: Secondary | ICD-10-CM

## 2024-05-14 DIAGNOSIS — R739 Hyperglycemia, unspecified: Secondary | ICD-10-CM

## 2024-05-14 DIAGNOSIS — I10 Essential (primary) hypertension: Secondary | ICD-10-CM

## 2024-05-14 DIAGNOSIS — K760 Fatty (change of) liver, not elsewhere classified: Secondary | ICD-10-CM

## 2024-05-14 DIAGNOSIS — Z940 Kidney transplant status: Secondary | ICD-10-CM

## 2024-05-14 NOTE — Assessment & Plan Note (Signed)
 S/p kidney transplant. Followed by transplant team. Continue belatacept, cellcept and prednisone. Doing better. Feels better.

## 2024-05-14 NOTE — Assessment & Plan Note (Signed)
Continue low carb diet and exercise.

## 2024-05-14 NOTE — Assessment & Plan Note (Signed)
Continue diet and exercise. Follow liver panel.  

## 2024-05-14 NOTE — Assessment & Plan Note (Signed)
 Low carb diet and exercise.  Continues on crestor . Schedule to check lipid panel.  Lab Results  Component Value Date   CHOL 154 05/02/2023   HDL 45.50 05/02/2023   LDLCALC 29 05/02/2023   LDLDIRECT 53.0 08/24/2022   TRIG 395.0 (H) 05/02/2023   CHOLHDL 3 05/02/2023

## 2024-05-14 NOTE — Assessment & Plan Note (Signed)
 Taking ozempic  - tolerating. Continue diet and exercise. Follow met b and A1c.

## 2024-05-14 NOTE — Assessment & Plan Note (Signed)
 Continue metoprolol . Blood pressure as outlined. No changes in medication. Follow pressures. Transplant team following kidney function tests.

## 2024-06-10 ENCOUNTER — Other Ambulatory Visit

## 2024-09-11 ENCOUNTER — Ambulatory Visit: Admitting: Internal Medicine
